# Patient Record
Sex: Male | Born: 1950 | Race: White | Hispanic: No | Smoking: Never smoker
Health system: Southern US, Community
[De-identification: ages and names within clinical notes are randomized; demographics above are authoritative.]

## PROBLEM LIST (undated history)

## (undated) DIAGNOSIS — I2510 Atherosclerotic heart disease of native coronary artery without angina pectoris: Principal | ICD-10-CM

## (undated) DIAGNOSIS — F419 Anxiety disorder, unspecified: Secondary | ICD-10-CM

## (undated) DIAGNOSIS — Z9889 Other specified postprocedural states: Secondary | ICD-10-CM

## (undated) HISTORY — DX: Atherosclerotic heart disease of native coronary artery without angina pectoris: I25.10

## (undated) HISTORY — PX: MITRAL VALVE REPAIR: SHX2039

## (undated) HISTORY — DX: Other specified postprocedural states: Z98.890

## (undated) HISTORY — PX: TONSILLECTOMY: SUR1361

---

## 2007-01-13 ENCOUNTER — Encounter: Admission: RE | Admit: 2007-01-13 | Discharge: 2007-01-13 | Payer: Self-pay | Admitting: Internal Medicine

## 2009-02-22 DIAGNOSIS — Z9889 Other specified postprocedural states: Secondary | ICD-10-CM

## 2009-02-22 HISTORY — DX: Other specified postprocedural states: Z98.890

## 2009-02-27 ENCOUNTER — Inpatient Hospital Stay (HOSPITAL_BASED_OUTPATIENT_CLINIC_OR_DEPARTMENT_OTHER): Admission: RE | Admit: 2009-02-27 | Discharge: 2009-02-27 | Payer: Self-pay | Admitting: Interventional Cardiology

## 2009-03-03 ENCOUNTER — Ambulatory Visit: Payer: Self-pay | Admitting: Thoracic Surgery (Cardiothoracic Vascular Surgery)

## 2009-03-06 ENCOUNTER — Encounter
Admission: RE | Admit: 2009-03-06 | Discharge: 2009-03-06 | Payer: Self-pay | Admitting: Thoracic Surgery (Cardiothoracic Vascular Surgery)

## 2009-03-07 ENCOUNTER — Encounter: Payer: Self-pay | Admitting: Thoracic Surgery (Cardiothoracic Vascular Surgery)

## 2009-03-07 ENCOUNTER — Ambulatory Visit (HOSPITAL_COMMUNITY)
Admission: RE | Admit: 2009-03-07 | Discharge: 2009-03-07 | Payer: Self-pay | Admitting: Thoracic Surgery (Cardiothoracic Vascular Surgery)

## 2009-03-11 ENCOUNTER — Encounter: Payer: Self-pay | Admitting: Thoracic Surgery (Cardiothoracic Vascular Surgery)

## 2009-03-11 ENCOUNTER — Inpatient Hospital Stay (HOSPITAL_COMMUNITY)
Admission: RE | Admit: 2009-03-11 | Discharge: 2009-03-15 | Payer: Self-pay | Admitting: Thoracic Surgery (Cardiothoracic Vascular Surgery)

## 2009-03-11 ENCOUNTER — Ambulatory Visit: Payer: Self-pay | Admitting: Thoracic Surgery (Cardiothoracic Vascular Surgery)

## 2009-03-24 ENCOUNTER — Ambulatory Visit: Payer: Self-pay | Admitting: Thoracic Surgery (Cardiothoracic Vascular Surgery)

## 2009-03-24 ENCOUNTER — Encounter
Admission: RE | Admit: 2009-03-24 | Discharge: 2009-03-24 | Payer: Self-pay | Admitting: Thoracic Surgery (Cardiothoracic Vascular Surgery)

## 2009-04-14 ENCOUNTER — Encounter
Admission: RE | Admit: 2009-04-14 | Discharge: 2009-04-14 | Payer: Self-pay | Admitting: Thoracic Surgery (Cardiothoracic Vascular Surgery)

## 2009-04-14 ENCOUNTER — Ambulatory Visit: Payer: Self-pay | Admitting: Thoracic Surgery (Cardiothoracic Vascular Surgery)

## 2009-04-17 ENCOUNTER — Encounter (HOSPITAL_COMMUNITY): Admission: RE | Admit: 2009-04-17 | Discharge: 2009-07-16 | Payer: Self-pay | Admitting: Interventional Cardiology

## 2009-07-23 ENCOUNTER — Encounter (HOSPITAL_COMMUNITY): Admission: RE | Admit: 2009-07-23 | Discharge: 2009-10-21 | Payer: Self-pay | Admitting: Interventional Cardiology

## 2009-10-13 ENCOUNTER — Ambulatory Visit: Payer: Self-pay | Admitting: Thoracic Surgery (Cardiothoracic Vascular Surgery)

## 2009-10-23 ENCOUNTER — Encounter (HOSPITAL_COMMUNITY)
Admission: RE | Admit: 2009-10-23 | Discharge: 2010-01-21 | Payer: Self-pay | Source: Home / Self Care | Admitting: Interventional Cardiology

## 2010-01-22 ENCOUNTER — Encounter (HOSPITAL_COMMUNITY)
Admission: RE | Admit: 2010-01-22 | Discharge: 2010-03-24 | Payer: Self-pay | Source: Home / Self Care | Attending: Interventional Cardiology | Admitting: Interventional Cardiology

## 2010-03-25 ENCOUNTER — Encounter (HOSPITAL_COMMUNITY): Admission: RE | Admit: 2010-03-25 | Payer: Self-pay | Source: Ambulatory Visit

## 2010-03-25 DIAGNOSIS — I059 Rheumatic mitral valve disease, unspecified: Secondary | ICD-10-CM | POA: Insufficient documentation

## 2010-03-25 DIAGNOSIS — D696 Thrombocytopenia, unspecified: Secondary | ICD-10-CM | POA: Insufficient documentation

## 2010-03-25 DIAGNOSIS — E785 Hyperlipidemia, unspecified: Secondary | ICD-10-CM | POA: Insufficient documentation

## 2010-03-25 DIAGNOSIS — Z5189 Encounter for other specified aftercare: Secondary | ICD-10-CM | POA: Insufficient documentation

## 2010-03-25 DIAGNOSIS — Z9889 Other specified postprocedural states: Secondary | ICD-10-CM | POA: Insufficient documentation

## 2010-03-27 ENCOUNTER — Encounter (HOSPITAL_COMMUNITY): Payer: Self-pay | Attending: Interventional Cardiology

## 2010-03-30 ENCOUNTER — Encounter (HOSPITAL_COMMUNITY): Payer: Self-pay

## 2010-04-01 ENCOUNTER — Encounter (HOSPITAL_COMMUNITY): Payer: Self-pay

## 2010-04-03 ENCOUNTER — Encounter (HOSPITAL_COMMUNITY): Payer: Self-pay

## 2010-04-06 ENCOUNTER — Encounter (HOSPITAL_COMMUNITY): Payer: Self-pay

## 2010-04-08 ENCOUNTER — Encounter (HOSPITAL_COMMUNITY): Payer: Self-pay

## 2010-04-10 ENCOUNTER — Encounter (HOSPITAL_COMMUNITY): Payer: Self-pay

## 2010-04-13 ENCOUNTER — Encounter (HOSPITAL_COMMUNITY): Payer: Self-pay

## 2010-04-15 ENCOUNTER — Encounter (HOSPITAL_COMMUNITY): Payer: Self-pay

## 2010-04-17 ENCOUNTER — Encounter (HOSPITAL_COMMUNITY): Payer: Self-pay

## 2010-04-20 ENCOUNTER — Encounter (HOSPITAL_COMMUNITY): Payer: Self-pay

## 2010-04-22 ENCOUNTER — Encounter (HOSPITAL_COMMUNITY): Payer: Self-pay

## 2010-04-24 ENCOUNTER — Encounter (HOSPITAL_COMMUNITY): Payer: Self-pay | Attending: Interventional Cardiology

## 2010-04-24 DIAGNOSIS — E785 Hyperlipidemia, unspecified: Secondary | ICD-10-CM | POA: Insufficient documentation

## 2010-04-24 DIAGNOSIS — D696 Thrombocytopenia, unspecified: Secondary | ICD-10-CM | POA: Insufficient documentation

## 2010-04-24 DIAGNOSIS — Z9889 Other specified postprocedural states: Secondary | ICD-10-CM | POA: Insufficient documentation

## 2010-04-24 DIAGNOSIS — Z5189 Encounter for other specified aftercare: Secondary | ICD-10-CM | POA: Insufficient documentation

## 2010-04-24 DIAGNOSIS — I059 Rheumatic mitral valve disease, unspecified: Secondary | ICD-10-CM | POA: Insufficient documentation

## 2010-04-27 ENCOUNTER — Encounter (HOSPITAL_COMMUNITY): Payer: Self-pay

## 2010-04-29 ENCOUNTER — Encounter (HOSPITAL_COMMUNITY): Payer: Self-pay

## 2010-05-01 ENCOUNTER — Encounter (HOSPITAL_COMMUNITY): Payer: Self-pay

## 2010-05-04 ENCOUNTER — Encounter (HOSPITAL_COMMUNITY): Payer: Self-pay

## 2010-05-06 ENCOUNTER — Encounter (HOSPITAL_COMMUNITY): Payer: Self-pay

## 2010-05-08 ENCOUNTER — Encounter (HOSPITAL_COMMUNITY): Payer: Self-pay

## 2010-05-10 LAB — TYPE AND SCREEN: Antibody Screen: NEGATIVE

## 2010-05-10 LAB — BLOOD GAS, ARTERIAL
Bicarbonate: 25.4 mEq/L — ABNORMAL HIGH (ref 20.0–24.0)
FIO2: 0.21 %
Patient temperature: 98.6
TCO2: 26.6 mmol/L (ref 0–100)
pCO2 arterial: 38.3 mmHg (ref 35.0–45.0)
pH, Arterial: 7.437 (ref 7.350–7.450)

## 2010-05-10 LAB — URINALYSIS, ROUTINE W REFLEX MICROSCOPIC
Bilirubin Urine: NEGATIVE
Hgb urine dipstick: NEGATIVE
Ketones, ur: NEGATIVE mg/dL
Nitrite: NEGATIVE
Urobilinogen, UA: 0.2 mg/dL (ref 0.0–1.0)
pH: 6 (ref 5.0–8.0)

## 2010-05-10 LAB — CBC
HCT: 48 % (ref 39.0–52.0)
Platelets: 210 10*3/uL (ref 150–400)
RBC: 4.74 MIL/uL (ref 4.22–5.81)

## 2010-05-10 LAB — POCT I-STAT 3, ART BLOOD GAS (G3+)
Acid-Base Excess: 2 mmol/L (ref 0.0–2.0)
Bicarbonate: 26 mEq/L — ABNORMAL HIGH (ref 20.0–24.0)
O2 Saturation: 96 %
pH, Arterial: 7.438 (ref 7.350–7.450)
pO2, Arterial: 78 mmHg — ABNORMAL LOW (ref 80.0–100.0)

## 2010-05-10 LAB — COMPREHENSIVE METABOLIC PANEL
ALT: 23 U/L (ref 0–53)
AST: 22 U/L (ref 0–37)
Alkaline Phosphatase: 43 U/L (ref 39–117)
BUN: 13 mg/dL (ref 6–23)
CO2: 29 mEq/L (ref 19–32)
Calcium: 10.4 mg/dL (ref 8.4–10.5)
Chloride: 98 mEq/L (ref 96–112)
GFR calc non Af Amer: 59 mL/min — ABNORMAL LOW (ref >60)
Potassium: 4.8 mEq/L (ref 3.5–5.1)
Sodium: 134 mEq/L — ABNORMAL LOW (ref 135–145)
Total Protein: 7.3 g/dL (ref 6.0–8.3)

## 2010-05-10 LAB — POCT I-STAT 3, VENOUS BLOOD GAS (G3P V)
Bicarbonate: 25.4 mEq/L — ABNORMAL HIGH (ref 20.0–24.0)
pCO2, Ven: 41.1 mmHg — ABNORMAL LOW (ref 45.0–50.0)
pH, Ven: 7.399 — ABNORMAL HIGH (ref 7.250–7.300)
pO2, Ven: 37 mmHg (ref 30.0–45.0)

## 2010-05-10 LAB — PROTIME-INR: Prothrombin Time: 13.7 seconds (ref 11.6–15.2)

## 2010-05-10 LAB — ABO/RH: ABO/RH(D): A POS

## 2010-05-11 ENCOUNTER — Encounter (HOSPITAL_COMMUNITY): Payer: Self-pay

## 2010-05-11 LAB — CBC
HCT: 17 % — ABNORMAL LOW (ref 39.0–52.0)
HCT: 27 % — ABNORMAL LOW (ref 39.0–52.0)
HCT: 27.2 % — ABNORMAL LOW (ref 39.0–52.0)
HCT: 39.2 % (ref 39.0–52.0)
Hemoglobin: 11.6 g/dL — ABNORMAL LOW (ref 13.0–17.0)
Hemoglobin: 13.4 g/dL (ref 13.0–17.0)
Hemoglobin: 5.8 g/dL — CL (ref 13.0–17.0)
Hemoglobin: 9.2 g/dL — ABNORMAL LOW (ref 13.0–17.0)
Hemoglobin: 9.3 g/dL — ABNORMAL LOW (ref 13.0–17.0)
MCHC: 33.7 g/dL (ref 30.0–36.0)
MCHC: 34.2 g/dL (ref 30.0–36.0)
MCHC: 34.3 g/dL (ref 30.0–36.0)
MCV: 97.4 fL (ref 78.0–100.0)
Platelets: 103 K/uL — ABNORMAL LOW (ref 150–400)
RBC: 2.79 MIL/uL — ABNORMAL LOW (ref 4.22–5.81)
RBC: 3.35 MIL/uL — ABNORMAL LOW (ref 4.22–5.81)
RBC: 3.85 MIL/uL — ABNORMAL LOW (ref 4.22–5.81)
RDW: 12.9 % (ref 11.5–15.5)
RDW: 13.2 % (ref 11.5–15.5)
RDW: 13.2 % (ref 11.5–15.5)
RDW: 15.5 % (ref 11.5–15.5)
RDW: 15.8 % — ABNORMAL HIGH (ref 11.5–15.5)
WBC: 13.3 K/uL — ABNORMAL HIGH (ref 4.0–10.5)
WBC: 15.7 10*3/uL — ABNORMAL HIGH (ref 4.0–10.5)

## 2010-05-11 LAB — POCT I-STAT 3, ART BLOOD GAS (G3+)
Acid-base deficit: 1 mmol/L (ref 0.0–2.0)
Bicarbonate: 20.9 mEq/L (ref 20.0–24.0)
O2 Saturation: 100 %
O2 Saturation: 89 %
Patient temperature: 34
Patient temperature: 37.4
TCO2: 22 mmol/L (ref 0–100)
TCO2: 25 mmol/L (ref 0–100)
pCO2 arterial: 39.8 mmHg (ref 35.0–45.0)
pCO2 arterial: 39.8 mmHg (ref 35.0–45.0)
pH, Arterial: 7.36 (ref 7.350–7.450)
pH, Arterial: 7.419 (ref 7.350–7.450)
pO2, Arterial: 299 mmHg — ABNORMAL HIGH (ref 80.0–100.0)

## 2010-05-11 LAB — DIFFERENTIAL
Basophils Absolute: 0 K/uL (ref 0.0–0.1)
Basophils Relative: 0 % (ref 0–1)
Eosinophils Absolute: 0 K/uL (ref 0.0–0.7)
Eosinophils Relative: 0 % (ref 0–5)
Lymphocytes Relative: 5 % — ABNORMAL LOW (ref 12–46)
Lymphs Abs: 0.6 K/uL — ABNORMAL LOW (ref 0.7–4.0)
Monocytes Absolute: 1 K/uL (ref 0.1–1.0)
Monocytes Relative: 8 % (ref 3–12)
Neutro Abs: 11.6 K/uL — ABNORMAL HIGH (ref 1.7–7.7)
Neutrophils Relative %: 87 % — ABNORMAL HIGH (ref 43–77)

## 2010-05-11 LAB — BASIC METABOLIC PANEL
BUN: 20 mg/dL (ref 6–23)
CO2: 24 mEq/L (ref 19–32)
Calcium: 7.4 mg/dL — ABNORMAL LOW (ref 8.4–10.5)
Chloride: 98 mEq/L (ref 96–112)
Creatinine, Ser: 1.16 mg/dL (ref 0.4–1.5)
GFR calc Af Amer: >60 mL/min (ref >60)
GFR calc non Af Amer: >60 mL/min (ref >60)
Glucose, Bld: 121 mg/dL — ABNORMAL HIGH (ref 70–99)
Glucose, Bld: 144 mg/dL — ABNORMAL HIGH (ref 70–99)
Potassium: 3.8 mEq/L (ref 3.5–5.1)

## 2010-05-11 LAB — POCT I-STAT, CHEM 8
BUN: 12 mg/dL (ref 6–23)
Chloride: 100 mEq/L (ref 96–112)
Chloride: 105 mEq/L (ref 96–112)
Glucose, Bld: 128 mg/dL — ABNORMAL HIGH (ref 70–99)
Glucose, Bld: 135 mg/dL — ABNORMAL HIGH (ref 70–99)
HCT: 18 % — ABNORMAL LOW (ref 39.0–52.0)
HCT: 35 % — ABNORMAL LOW (ref 39.0–52.0)
Potassium: 4 mEq/L (ref 3.5–5.1)
Potassium: 5.2 mEq/L — ABNORMAL HIGH (ref 3.5–5.1)

## 2010-05-11 LAB — BASIC METABOLIC PANEL WITH GFR
BUN: 15 mg/dL (ref 6–23)
CO2: 26 meq/L (ref 19–32)
Calcium: 7.2 mg/dL — ABNORMAL LOW (ref 8.4–10.5)
Chloride: 101 meq/L (ref 96–112)
Creatinine, Ser: 1.14 mg/dL (ref 0.4–1.5)
GFR calc Af Amer: >60 mL/min (ref >60)
GFR calc non Af Amer: >60 mL/min (ref >60)
Glucose, Bld: 114 mg/dL — ABNORMAL HIGH (ref 70–99)
Potassium: 4.1 meq/L (ref 3.5–5.1)
Sodium: 134 meq/L — ABNORMAL LOW (ref 135–145)

## 2010-05-11 LAB — POCT I-STAT 4, (NA,K, GLUC, HGB,HCT)
Glucose, Bld: 145 mg/dL — ABNORMAL HIGH (ref 70–99)
Glucose, Bld: 82 mg/dL (ref 70–99)
HCT: 31 % — ABNORMAL LOW (ref 39.0–52.0)
HCT: 42 % (ref 39.0–52.0)
Hemoglobin: 10.5 g/dL — ABNORMAL LOW (ref 13.0–17.0)
Hemoglobin: 14.3 g/dL (ref 13.0–17.0)
Potassium: 3.8 mEq/L (ref 3.5–5.1)
Potassium: 3.8 mEq/L (ref 3.5–5.1)
Potassium: 3.9 mEq/L (ref 3.5–5.1)
Potassium: 5.2 mEq/L — ABNORMAL HIGH (ref 3.5–5.1)
Sodium: 128 mEq/L — ABNORMAL LOW (ref 135–145)
Sodium: 134 mEq/L — ABNORMAL LOW (ref 135–145)
Sodium: 135 mEq/L (ref 135–145)

## 2010-05-11 LAB — PREPARE FRESH FROZEN PLASMA

## 2010-05-11 LAB — GLUCOSE, CAPILLARY
Glucose-Capillary: 107 mg/dL — ABNORMAL HIGH (ref 70–99)
Glucose-Capillary: 114 mg/dL — ABNORMAL HIGH (ref 70–99)
Glucose-Capillary: 122 mg/dL — ABNORMAL HIGH (ref 70–99)
Glucose-Capillary: 124 mg/dL — ABNORMAL HIGH (ref 70–99)
Glucose-Capillary: 126 mg/dL — ABNORMAL HIGH (ref 70–99)
Glucose-Capillary: 141 mg/dL — ABNORMAL HIGH (ref 70–99)
Glucose-Capillary: 84 mg/dL (ref 70–99)

## 2010-05-11 LAB — PROTIME-INR
INR: 1.57 — ABNORMAL HIGH (ref 0.00–1.49)
INR: 1.67 — ABNORMAL HIGH (ref 0.00–1.49)
Prothrombin Time: 14.4 seconds (ref 11.6–15.2)
Prothrombin Time: 18.6 seconds — ABNORMAL HIGH (ref 11.6–15.2)

## 2010-05-11 LAB — HEMOGLOBIN AND HEMATOCRIT, BLOOD
HCT: 33.5 % — ABNORMAL LOW (ref 39.0–52.0)
Hemoglobin: 11.1 g/dL — ABNORMAL LOW (ref 13.0–17.0)

## 2010-05-11 LAB — PREPARE PLATELETS

## 2010-05-11 LAB — APTT: aPTT: 41 seconds — ABNORMAL HIGH (ref 24–37)

## 2010-05-11 LAB — MAGNESIUM: Magnesium: 2.4 mg/dL (ref 1.5–2.5)

## 2010-05-11 LAB — CREATININE, SERUM
Creatinine, Ser: 1 mg/dL (ref 0.4–1.5)
GFR calc non Af Amer: >60 mL/min (ref >60)
GFR calc non Af Amer: >60 mL/min (ref >60)

## 2010-05-11 LAB — PLATELET COUNT: Platelets: 165 10*3/uL (ref 150–400)

## 2010-05-11 LAB — MRSA PCR SCREENING: MRSA by PCR: NEGATIVE

## 2010-05-13 ENCOUNTER — Encounter (HOSPITAL_COMMUNITY): Payer: Self-pay

## 2010-05-15 ENCOUNTER — Encounter (HOSPITAL_COMMUNITY): Payer: Self-pay

## 2010-05-18 ENCOUNTER — Encounter (HOSPITAL_COMMUNITY): Payer: Self-pay

## 2010-05-20 ENCOUNTER — Encounter (HOSPITAL_COMMUNITY): Payer: Self-pay

## 2010-05-22 ENCOUNTER — Encounter (HOSPITAL_COMMUNITY): Payer: Self-pay

## 2010-05-25 ENCOUNTER — Encounter (HOSPITAL_COMMUNITY): Payer: Self-pay | Attending: Interventional Cardiology

## 2010-05-25 DIAGNOSIS — Z9889 Other specified postprocedural states: Secondary | ICD-10-CM | POA: Insufficient documentation

## 2010-05-25 DIAGNOSIS — I059 Rheumatic mitral valve disease, unspecified: Secondary | ICD-10-CM | POA: Insufficient documentation

## 2010-05-25 DIAGNOSIS — D696 Thrombocytopenia, unspecified: Secondary | ICD-10-CM | POA: Insufficient documentation

## 2010-05-25 DIAGNOSIS — Z5189 Encounter for other specified aftercare: Secondary | ICD-10-CM | POA: Insufficient documentation

## 2010-05-25 DIAGNOSIS — E785 Hyperlipidemia, unspecified: Secondary | ICD-10-CM | POA: Insufficient documentation

## 2010-05-27 ENCOUNTER — Encounter (HOSPITAL_COMMUNITY): Payer: Self-pay

## 2010-05-29 ENCOUNTER — Encounter (HOSPITAL_COMMUNITY): Payer: Self-pay

## 2010-06-01 ENCOUNTER — Encounter (HOSPITAL_COMMUNITY): Payer: Self-pay

## 2010-06-03 ENCOUNTER — Encounter (HOSPITAL_COMMUNITY): Payer: Self-pay

## 2010-06-05 ENCOUNTER — Encounter (HOSPITAL_COMMUNITY): Payer: Self-pay

## 2010-06-08 ENCOUNTER — Encounter (HOSPITAL_COMMUNITY): Payer: Self-pay

## 2010-06-10 ENCOUNTER — Encounter (HOSPITAL_COMMUNITY): Payer: Self-pay

## 2010-06-12 ENCOUNTER — Encounter (HOSPITAL_COMMUNITY): Payer: Self-pay

## 2010-06-15 ENCOUNTER — Encounter (HOSPITAL_COMMUNITY): Payer: Self-pay

## 2010-06-17 ENCOUNTER — Encounter (HOSPITAL_COMMUNITY): Payer: Self-pay

## 2010-06-19 ENCOUNTER — Encounter (HOSPITAL_COMMUNITY): Payer: Self-pay

## 2010-06-22 ENCOUNTER — Encounter (HOSPITAL_COMMUNITY): Payer: Self-pay

## 2010-06-24 ENCOUNTER — Encounter (HOSPITAL_COMMUNITY): Payer: Self-pay | Attending: Interventional Cardiology

## 2010-06-24 DIAGNOSIS — I059 Rheumatic mitral valve disease, unspecified: Secondary | ICD-10-CM | POA: Insufficient documentation

## 2010-06-24 DIAGNOSIS — Z5189 Encounter for other specified aftercare: Secondary | ICD-10-CM | POA: Insufficient documentation

## 2010-06-24 DIAGNOSIS — D696 Thrombocytopenia, unspecified: Secondary | ICD-10-CM | POA: Insufficient documentation

## 2010-06-24 DIAGNOSIS — E785 Hyperlipidemia, unspecified: Secondary | ICD-10-CM | POA: Insufficient documentation

## 2010-06-24 DIAGNOSIS — Z9889 Other specified postprocedural states: Secondary | ICD-10-CM | POA: Insufficient documentation

## 2010-06-26 ENCOUNTER — Encounter (HOSPITAL_COMMUNITY): Payer: Self-pay

## 2010-06-29 ENCOUNTER — Encounter (HOSPITAL_COMMUNITY): Payer: Self-pay

## 2010-07-01 ENCOUNTER — Encounter (HOSPITAL_COMMUNITY): Payer: Self-pay

## 2010-07-03 ENCOUNTER — Encounter (HOSPITAL_COMMUNITY): Payer: Self-pay

## 2010-07-04 IMAGING — CR DG CHEST 2V
2 series · 2 of 2 positions shown · non-contrast
Comparison: Chest radiograph 03/15/2009

CLINICAL DATA: Weakness, mitral valve regurgitation

CHEST - 2 VIEW

[w chest pa]
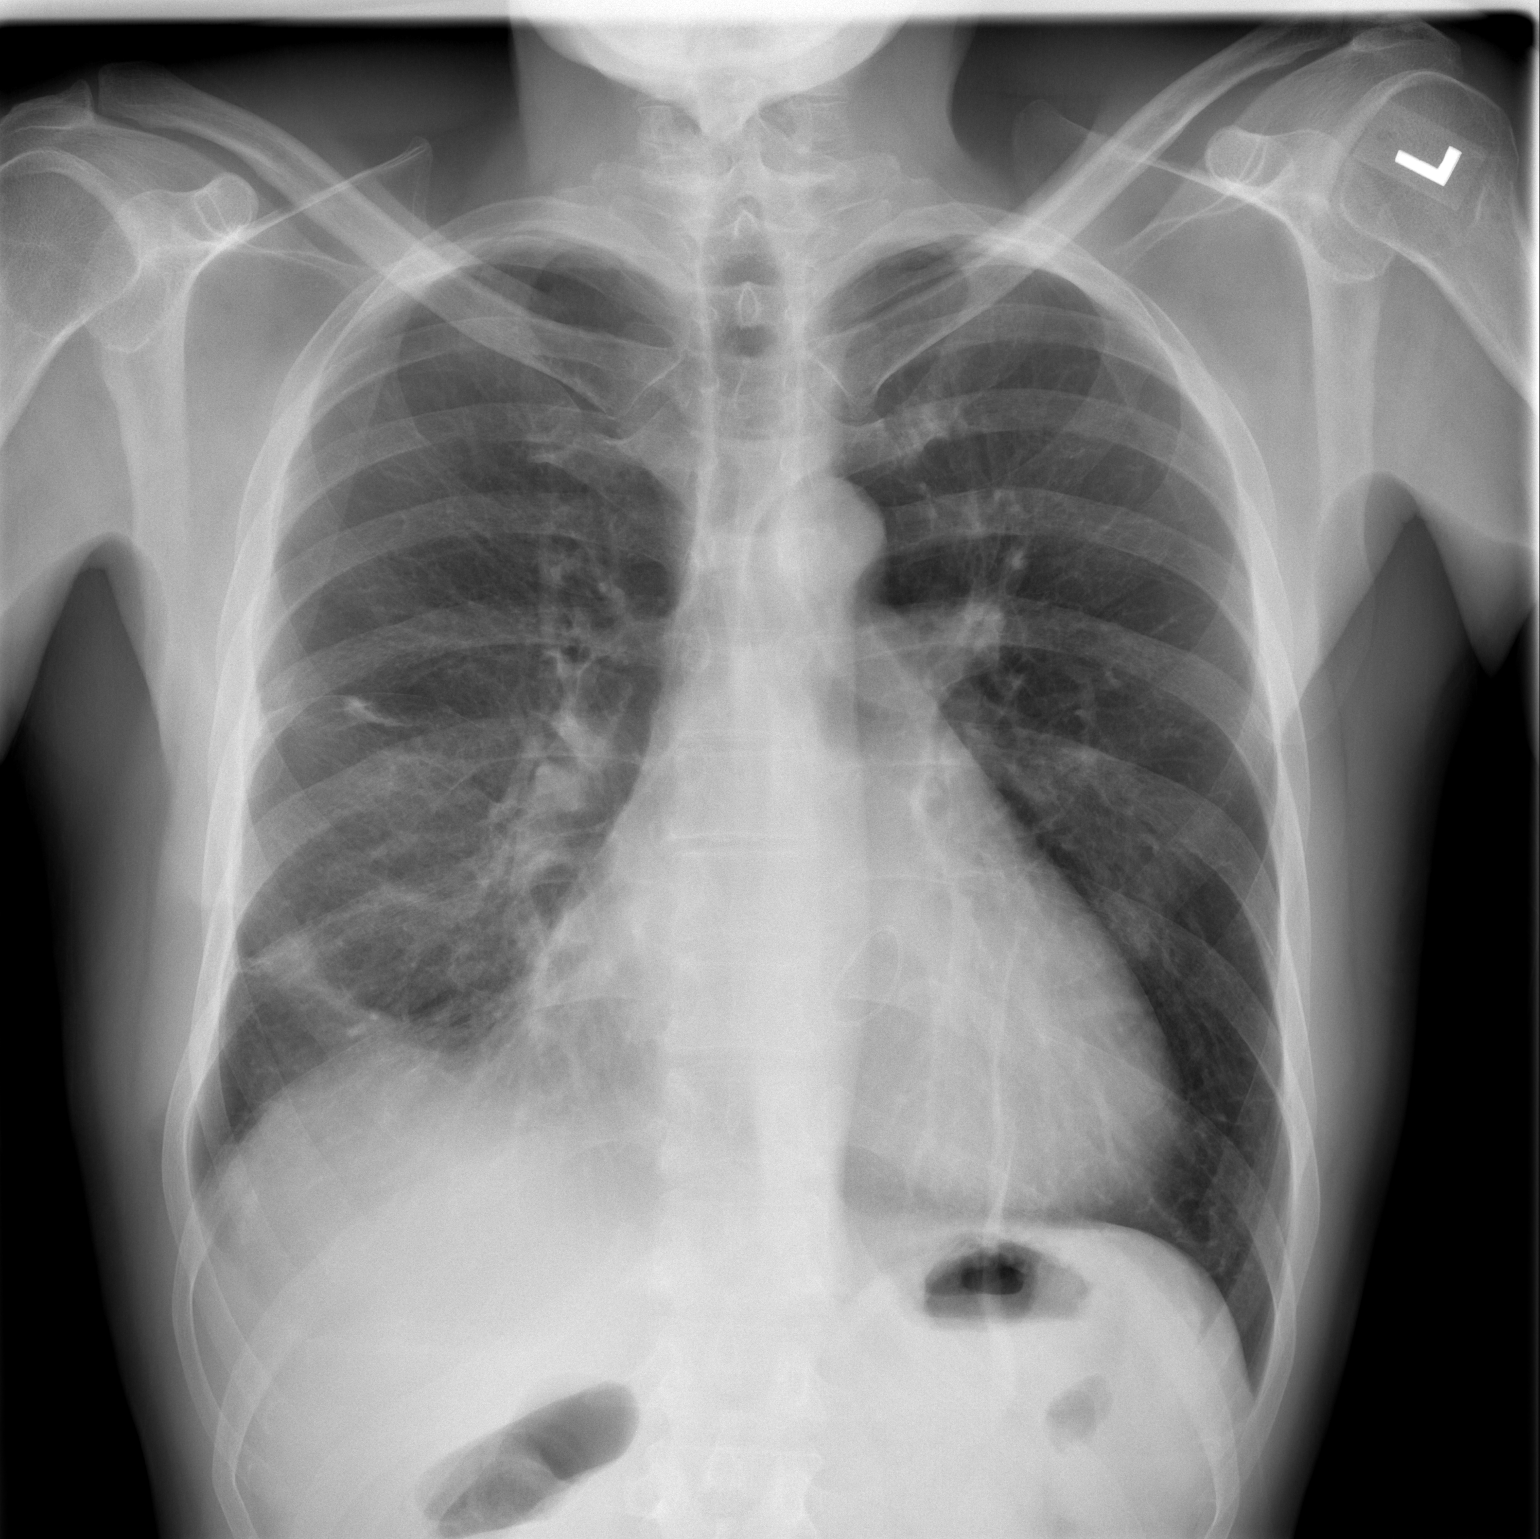

[w chest lat]
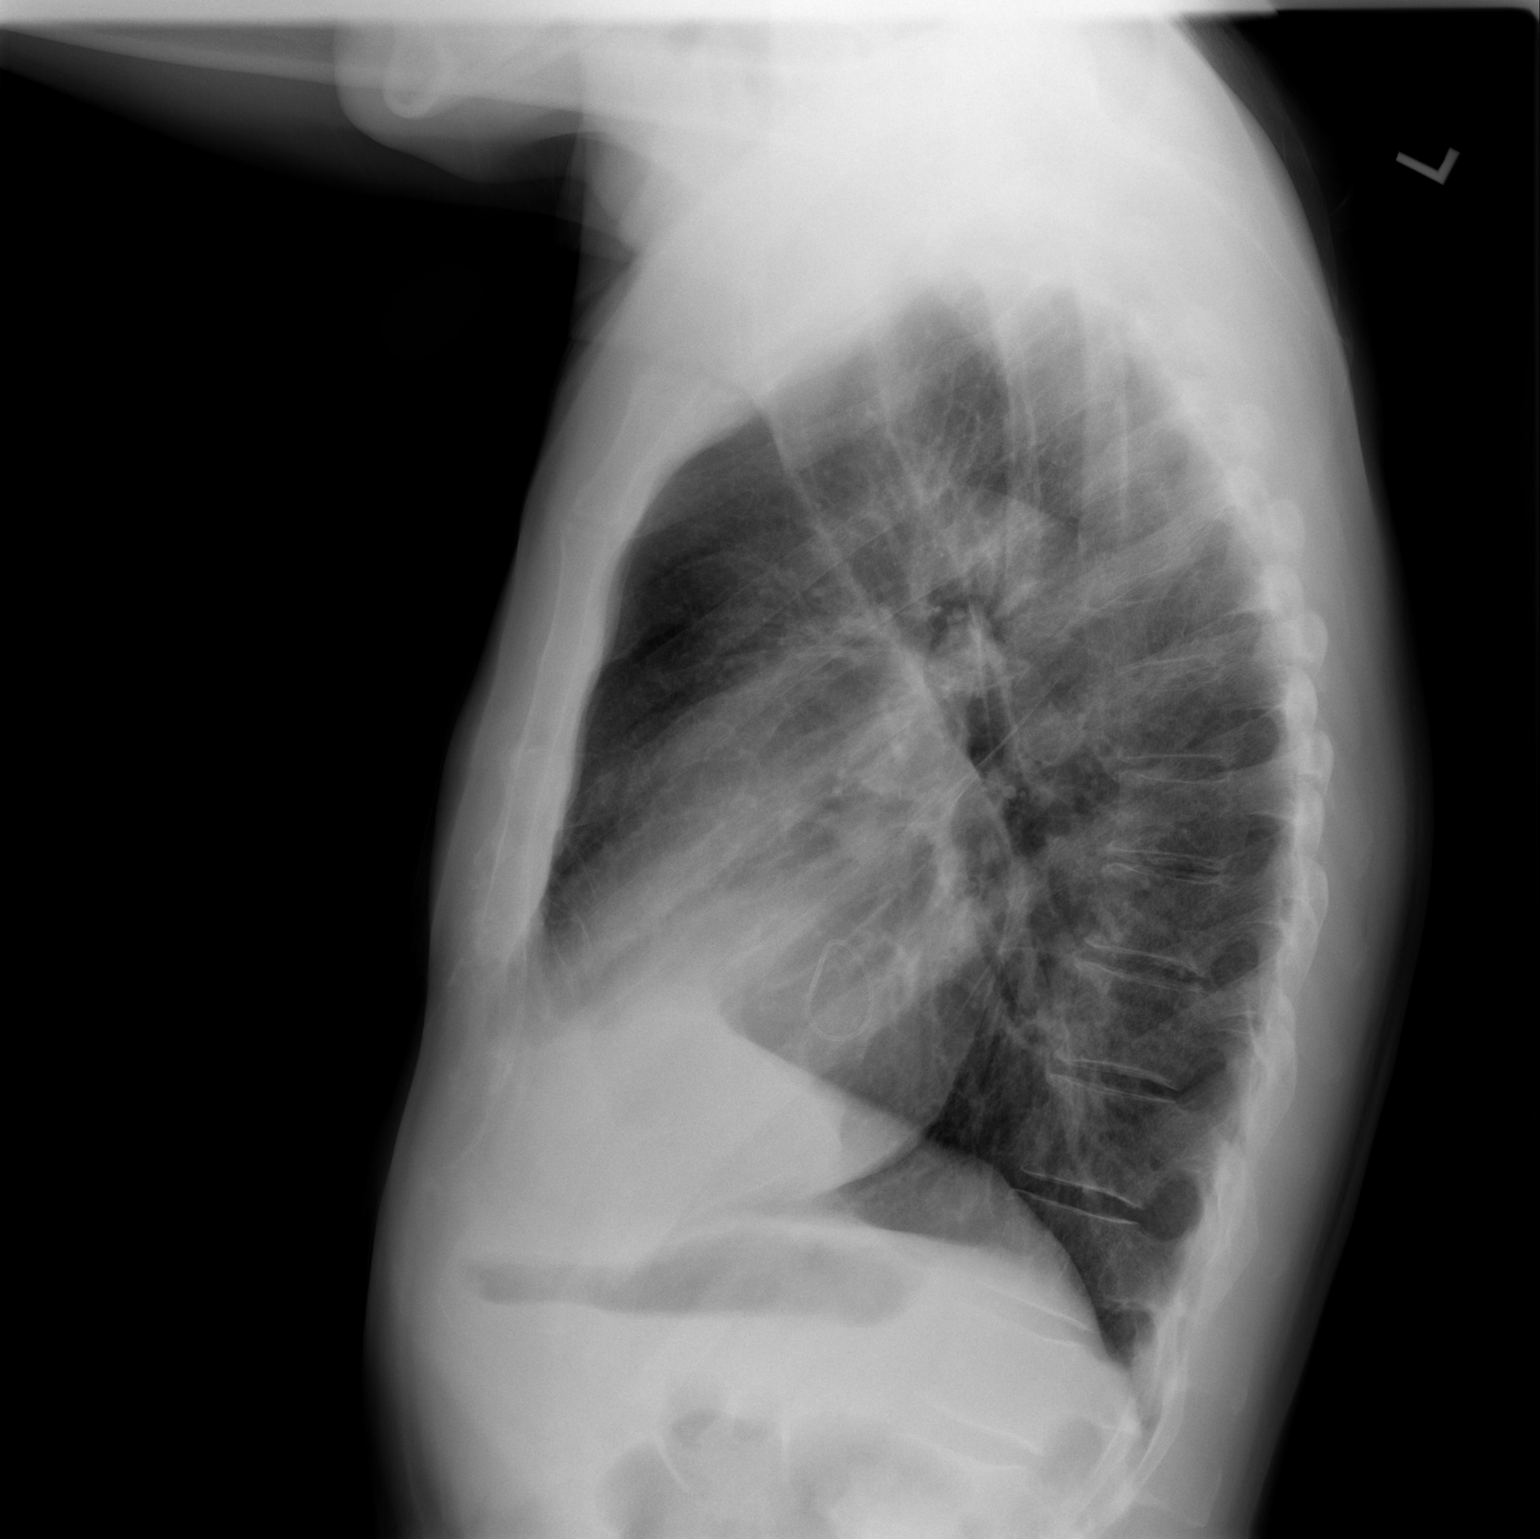

[2 of 2 positions shown; findings below may reference images not displayed]

FINDINGS: Stable enlarged cardiac silhouette.  Artificial valve
noted.  There is persistent atelectasis at the right lung base
which is slightly improved compared to prior.  The lungs are
hyperinflated.  No evidence of pneumothorax, consolidation, or
pleural fluid. Resolution of right chest wall subcutaneous gas.
IMPRESSION: 1.  Stable cardiomegaly.
2. Atelectasis  at the right lung base which is improved in the
interval.
3.  Interval resolution of right chest wall subcutaneous gas.

## 2010-07-06 ENCOUNTER — Encounter (HOSPITAL_COMMUNITY): Payer: Self-pay

## 2010-07-07 NOTE — Assessment & Plan Note (Signed)
OFFICE VISIT   Jerry Tanner, Jerry Tanner  DOB:  April 18, 1950                                        March 24, 2009  CHART #:  [id number]   HISTORY:  The patient is a 60 year old male from [place] who is seen  in the office in routine follow up having undergone a right miniature  thoracotomy for mitral valve repair (a complex valvuloplasty including  quadrangular resection of the posterior leaflets with sliding leaflet  reconstruction and 28-mm Memo 3-D ring annuloplasty).  This was done by  Dr. Cornelius Moras on March 11, 2009.  The patient did quite well  postoperatively and was discharged home on the March 15, 2009, in very  stable condition.  Initially postoperatively, the patient felt as though  he was doing quite well.  However, over the past approximate week he has  had some difficulties with weakness and dizziness.  He is taking only  occasional pain medication.  He denies fevers, chills, or other  constitutional symptoms.   DIAGNOSTIC TESTS:  A chest x-ray was obtained on today's date.  It  reveals some moderate right lower lobe atelectasis; however, no findings  consistent with congestive failure or pleural effusions.   PHYSICAL EXAMINATION:  Vital Signs:  Blood pressure 119/76, pulse 86,  respirations 18, oxygen saturation is 99% on room air.  General:  Appearance is a well-developed adult male in no acute distress.  Pulmonary:  Diminished slightly coarse breath sounds in the right base,  otherwise clear.  Cardiac:  Regular rate and rhythm.  Normal S1 and S2.  No murmurs.  No gallops.  No rubs.  Incisions are all inspected and  healing well without evidence of infection.  I discontinued 2 silk  sutures.  Extremities:  No edema.   ASSESSMENT:  The patient is making good overall recovery following his  minimally invasive mitral valve repair.  I feel as though at this time  we can discontinue his Lasix and potassium as it may be contributing to  his symptoms of  dizziness and fatigue.  He also feels somewhat dry and  frequently thirsty.  He is evidencing no findings consistent with  congestive failure clinically.  We will see him again in the office in 3  weeks with a repeat chest x-ray at that time.  I have asked  him to call the office and we can see him sooner if he has continued  ongoing difficulty with his feelings of weakness and dizziness after  stopping the Lasix and potassium.  He agrees to this.   Rowe Clack, P.A.-C.   Sherryll Burger  D:  03/24/2009  T:  03/25/2009  Job:  [id number]   cc:   Lyn Records, M.D.  Theressa Millard, M.D.

## 2010-07-07 NOTE — Assessment & Plan Note (Signed)
OFFICE VISIT   Jerry Tanner, Jerry Tanner  DOB:  March 17, 1950                                        April 14, 2009  CHART #:  [id number]   ADDENDUM   The patient underwent followup chest x-ray at [facility] today.  Chest x-ray demonstrates clear lung fields bilaterally with trivial  residual bibasilar atelectasis.  His lung fields overall look clear.  There is mild cardiomegaly which is stable.  No other abnormalities are  noted.    Salvatore Decent. Cornelius Moras, M.D.  Electronically Signed   CHO/MEDQ  D:  04/14/2009  T:  04/14/2009  Job:  [id number]

## 2010-07-07 NOTE — H&P (Signed)
HISTORY AND PHYSICAL EXAMINATION   March 03, 2009   Re:  GRAVES, NIPP       DOB:  11/16/1950   DATE OF [facility] ADMISSION AND SURGERY:  March 11, 2009   HISTORY OF PRESENT ILLNESS:  The patient is a 60 year old gentleman from  [place] with longstanding history of mitral valve prolapse and mitral  regurgitation.  The patient states that a heart murmur was picked up on  routine physical exam more than 15 years ago.  Since then, he has been  followed with periodic echocardiograms.  Several years ago, he was noted  to have significant mitral regurgitation, but he had continued to remain  asymptomatic.  This past summer, he developed an episode of what was  thought to be bronchitis.  At that time, he had a nonproductive cough as  well as symptoms of orthopnea and paroxysmal nocturnal dyspnea.  Most of  these symptoms improved.  However, since then he has continued to remain  fatigued and he admits that he has cut back his exercise and physical  activity significantly due to decreased exercise tolerance.  More  recently, he has been noted that his heart has been being stronger and  he has felt sensation of palpitations.  He returned to see Dr. Katrinka Blazing at  the [facility] Office on February 25, 2009.  At that time,  electrocardiogram was performed demonstrating what appeared to be sinus  tachycardia and B-Natriuretic peptide level was elevated at 304.  He  underwent a followup 2-D echocardiogram, which demonstrated the presence  of a flail posterior leaflet of the mitral valve with severe (4+) mitral  regurgitation.  There was significant sign of pulmonary hypertension  with some right ventricular failure and probably moderate tricuspid  regurgitation.  The patient was subsequently scheduled for elective left  and right heart catheterization.  This was performed by Dr. Katrinka Blazing on  February 27, 2009.  Pulmonary artery pressures were significantly elevated  and  measured 72/30 with pulmonary capillary wedge pressure of 25 and a  large V-wave of 46 mmHg.  Resting cardiac output was 4.2-4.5 L per  minute as measured by both thermodilution and the method of Fick.  Central venous pressure was 6.  Left ventriculogram demonstrated the  presence of dilated left ventricle with severe (4+) mitral  regurgitation.  Ejection fraction was estimated 45-50%.  There was  severe left atrial enlargement.  Coronary arteriography demonstrated the  presence of 50% stenosis of the left anterior descending coronary  artery.  There is otherwise no significant coronary artery disease  appreciated.  There is right-dominant coronary circulation.  The patient  has been referred to consider elective mitral valve repair.   REVIEW OF SYSTEMS:  GENERAL:  The patient reports normal appetite.  He  has not been gaining nor losing weight recently.  He is 5 feet 80 inches  tall and weighs approximately 163 pounds.  He does report exertional  fatigue.  CARDIAC:  Notable for the absence of any chest pain, chest pressure,  chest tightness either with activity or at rest.  He reports that he has  not recently had any problems with shortness of breath.  He denies PND,  orthopnea, lower extremity edema.  RESPIRATORY:  Negative.  The patient denies ongoing cough.  GASTROINTESTINAL:  Negative.  The patient has no difficulty swallowing.  He denies hematochezia, hematemesis, melena.  GENITOURINARY:  Negative.  The patient denies urinary urgency or frequency.  PERIPHERAL VASCULAR:  Negative.  NEUROLOGIC:  Negative.  HEENT:  Negative.  PSYCHIATRIC:  Notable for some problems with anxiety.  HEMATOLOGIC:  Negative.   PAST MEDICAL HISTORY:  1. Mitral valve prolapse with mitral regurgitation.  2. Hyperlipidemia.   PAST SURGICAL HISTORY:  Tonsillectomy.   FAMILY HISTORY:  The patient's mother had mitral valve repair last  summer.   SOCIAL HISTORY:  The patient is married and lives with  his wife here in  [place].  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  He is a  nonsmoker.  He denies significant alcohol consumption.   CURRENT MEDICATIONS:  1. Lisinopril/hydrochlorothiazide 10/12.5 one tablet daily.  2. Bupropion 100 mg daily.  3. Xanax 0.5 mg daily as needed.  4. Citalopram 40 mg daily.  5. Omega-3 fatty acid.   DRUG ALLERGIES:  None known.   PHYSICAL EXAMINATION:  General:  The patient is a well-appearing male  who appears of his stated age in no acute distress.  Blood pressure  140/82, pulse 112, oxygen saturation 98%.  HEENT:  Unrevealing.  There  are no carotid bruits.  There is no jugular venous distention.  There  are no palpable lymphadenopathy.  Auscultation of the chest demonstrates  clear breath sounds, which are symmetrical bilaterally.  No wheezes or  rhonchi are noted.  Cardiovascular:  Notable for somewhat elevated pulse  rate but regular rhythm.  There is a prominent grade 3-4/6 holosystolic  murmur heard all across the precordium with radiation to the back and  axilla.  No diastolic murmurs are noted.  Abdomen:  Soft, nondistended,  nontender.  Bowel sounds are present.  Extremities: Warm and well  perfused.  There is no lower extremity edema.  Femoral pulses are strong  and easily palpable.  Rectal and GU:  Both deferred.  Neurologic:  Grossly nonfocal and symmetrical throughout.   DIAGNOSTIC TESTS:  Transthoracic echocardiogram performed at [facility] Office last week is reviewed.  This demonstrates obvious  flail segment of the posterior leaflet of the mitral valve with severe  (4+) mitral regurgitation.  There is obviously flow reversal in the  pulmonary veins.  The left ventricle is mild to moderately dilated.  There is mild left ventricular dysfunction.  The aortic valve is normal.  There is significant right ventricular dilatation and volume overload  with septal flattening.  There is mild-to-moderate tricuspid   regurgitation.  No other abnormalities are noted.   Cardiac catheterization performed by Dr. Katrinka Blazing last week is reviewed.  Findings are as discussed previously.  Coronary anatomy is notable for  50% proximal stenosis of the left anterior descending coronary artery.  There is some calcification within this vessel.  However, the degree of  stenosis does not appear to be severe enough to warrant surgical  revascularization.  There are otherwise only luminal irregularities and  no other significant coronary artery disease appreciated.  There is  right-dominant coronary circulation.   IMPRESSION:  Mitral valve prolapse with severe (4+) mitral  regurgitation.  The patient has fairly high pulmonary artery pressures  with some right ventricular dilatation and failure and at least mild-to-  moderate tricuspid regurgitation as well.  He is already started to see  some left ventricular chamber enlargement and some tendency towards  decreased left ventricular function.  I agree that he should best be  treated with elective mitral valve repair as soon as practical.  I do  not feel that the degree of stenosis on the left anterior descending  coronary artery was severe  enough to merit surgical revascularization at  the time of surgery.  I am skeptical of the left internal mammary artery  graft would remained patent due to the presence of significant  competitive flow.  There is no other significant coronary artery disease  to be concerned about.   PLAN:  I have discussed options at length with the patient and his  family here in the office today.  Alternative treatment strategies have  been discussed.  They understand and accept all potential associated  risks of surgery including but not limited to risk of death, stroke,  myocardial infarction, congestive heart failure, respiratory failure,  renal failure, pneumonia, bleeding requiring blood transfusion,  arrhythmia, heart block and bradycardia  requiring permanent pacemaker.  They understand that we will make every attempt to repair his valve, but  there is a small chance that valve repair will not be feasible.  If that  were to occur, we would plan to replace his valve using a mechanical  prosthesis because of this relatively young age and otherwise fairly  good life expectancy.  He understands that we will plan to approach his  valve using minimally invasive techniques through right miniature  thoracotomy.  He understands that there is always a small chance we  would have to enlarge the thoracotomy or convert to a full sternotomy  during surgery.  All of these questions have been addressed.  We will  obtain CT angiogram of the thoracic and abdominal aorta to rule out  significant atherosclerotic disease in anticipation of surgery.  We will  go ahead and start him on prophylactic amiodarone to decrease his  likelihood of postoperative atrial dysrhythmias.  We plan to proceed  with surgery on Tuesday January 18.  All of the questions have been  addressed.   Salvatore Decent. Cornelius Moras, M.D.  Electronically Signed   CHO/MEDQ  D:  03/03/2009  T:  03/04/2009  Job:  [id number]   cc:   Lyn Records, M.D.  Theressa Millard, M.D.

## 2010-07-07 NOTE — Assessment & Plan Note (Signed)
OFFICE VISIT   MAXFIELD, GILDERSLEEVE  DOB:  [birth year]                                        April 14, 2009  CHART #:  [id number]   HISTORY:  The patient returns to the office today for further followup  status post right miniature thoracotomy for mitral valve repair on  March 11, 2009.  Postoperatively, he has done very well.  He has been  seen in follow up by Dr. Katrinka Blazing and his Coumadin dosing has been  monitored and adjusted through his office.  He reports that he thinks  his last INR measured 2.9.  He returns to the office for routine  followup today.  Overall, he feels quite well.  He has minimal residual  soreness right along his surgical incision from the right mini  thoracotomy.  He otherwise has no pain at all.  His exercise tolerance  is quite good and notably better than it was prior to surgery.  He is  walking more than a mile daily and he notes that he does not get short  of breath as easily as he used to.  His strength is slowly improving.  His appetite is normal.  He is sleeping well at night.  He has been  driving a car and overall having no problems.  He has not had any tachy  palpitations or dizzy spells.  The remainder of his review of systems is  entirely unrevealing.   CURRENT MEDICATIONS:  Amiodarone, Coumadin, aspirin, lisinopril,  simvastatin.   PHYSICAL EXAMINATION:  Notable for well-appearing male with blood  pressure 108/70, pulse 88, oxygen saturation 99% on room air.  Examination of the chest reveals a mini thoracotomy incision that is  healing very nicely.  Breath sounds are clear to auscultation and  symmetrical bilaterally.  No wheezes or rhonchi are noted.  Cardiovascular exam demonstrates regular rate and rhythm.  No murmurs,  rubs, or gallops are appreciated.  The abdomen is soft, nontender.  The  extremities are warm and well perfused.  Right groin incision is healing  fine.  There is no swelling or erythema.  Pulses are  intact.   IMPRESSION:  Excellent progress following right mini thoracotomy for  mitral valve repair.   PLAN:  I have instructed the patient to go ahead and stop taking  amiodarone.  This was originally ordered prophylactically to prevent  atrial arrhythmias, and he has not had any arrhythmias at all since his  surgery.  I think he can go ahead and stop it now.  I have suggested  that he probably should stay on Coumadin for another 4-6 weeks, after  which time he could simply go back to aspirin daily.  I have encouraged  at him to continue to gradually increase his physical activity without  any particular limitations at this point.  All of his questions have  been addressed.  At some point, he plans have a followup echocardiogram  through Dr. Michaelle Copas office within the next few months.  We will plan to  see him back in 6 months and review the results of his followup echo at  that time.  All of his questions have been addressed.   Salvatore Decent. Cornelius Moras, M.D.  Electronically Signed   CHO/MEDQ  D:  04/14/2009  T:  04/14/2009  Job:  [id number]   cc:  Lyn Records, M.D.  Theressa Millard, M.D.

## 2010-07-07 NOTE — Assessment & Plan Note (Signed)
OFFICE VISIT   Jerry Tanner, Jerry Tanner  DOB:  [birth year]                                        October 13, 2009  CHART #:  [id number]   HISTORY OF PRESENT ILLNESS:  The patient returns for routine followup  status post right miniature thoracotomy for mitral valve repair on  March 11, 2009.  Postoperatively, he has done very well.  He was last  seen here in the office on April 14, 2009.  Since then, he has had no  problems whatsoever.  He has stuck with the cardiac rehab program and he  continues to go 3 times a week.  He states that his exercise tolerance  is much better than it had been for several years leading up until his  surgery and he feels much better than he has in quite some time.  He has  absolutely no complaints.  He continues to follow up with Dr. Katrinka Blazing.  A  repeat echocardiogram was performed on September 09, 2009, demonstrating  normal left ventricular function with an intact mitral valve repair and  no residual mitral regurgitation.   CURRENT MEDICATIONS:  Aspirin, metoprolol, Wellbutrin, Xanax, and  simvastatin.   PHYSICAL EXAMINATION:  Notable for well-appearing male with blood  pressure 108/73, pulse 81, and oxygen saturation 99% on room air.  Examination of the chest reveals clear breath sounds that are  symmetrical bilaterally.  No wheezes, rales, or rhonchi are noted.  Cardiovascular exam includes regular rate and rhythm.  No murmurs, rubs,  gallops, are appreciated.  The abdomen is soft and nontender.  The  extremities are warm and well perfused.  There is no lower extremity  edema.   DIAGNOSTIC TESTS:  Report of 2-D echocardiogram performed September 09, 2009,  at [facility] is reviewed.  By report, this revealed mitral valve  repair with stable mitral valve annulus and no mitral regurgitation  whatsoever.  There is borderline left atrial enlargement.  Left  ventricular function was normal with ejection fraction estimated 50-55%.  No other  significant abnormalities were noted.   IMPRESSION:  The patient is doing very well, more than 6 months status  post mitral valve repair.   PLAN:  In the future, the patient will call and return to see [place] as  needed.  All of his questions have been addressed.   Salvatore Decent. Cornelius Moras, M.D.  Electronically Signed   CHO/MEDQ  D:  10/13/2009  T:  10/13/2009  Job:  [id number]   cc:   Lyn Records, M.D.  Theressa Millard, M.D.

## 2010-07-08 ENCOUNTER — Encounter (HOSPITAL_COMMUNITY): Payer: Self-pay

## 2010-07-10 ENCOUNTER — Encounter (HOSPITAL_COMMUNITY): Payer: Self-pay

## 2010-07-13 ENCOUNTER — Encounter (HOSPITAL_COMMUNITY): Payer: Self-pay

## 2010-07-15 ENCOUNTER — Encounter (HOSPITAL_COMMUNITY): Payer: Self-pay

## 2010-07-17 ENCOUNTER — Encounter (HOSPITAL_COMMUNITY): Payer: Self-pay

## 2010-07-20 ENCOUNTER — Encounter (HOSPITAL_COMMUNITY): Payer: Self-pay

## 2010-07-22 ENCOUNTER — Encounter (HOSPITAL_COMMUNITY): Payer: Self-pay

## 2010-07-24 ENCOUNTER — Encounter (HOSPITAL_COMMUNITY): Payer: Self-pay | Attending: Interventional Cardiology

## 2010-07-24 DIAGNOSIS — D696 Thrombocytopenia, unspecified: Secondary | ICD-10-CM | POA: Insufficient documentation

## 2010-07-24 DIAGNOSIS — Z5189 Encounter for other specified aftercare: Secondary | ICD-10-CM | POA: Insufficient documentation

## 2010-07-24 DIAGNOSIS — E785 Hyperlipidemia, unspecified: Secondary | ICD-10-CM | POA: Insufficient documentation

## 2010-07-24 DIAGNOSIS — Z9889 Other specified postprocedural states: Secondary | ICD-10-CM | POA: Insufficient documentation

## 2010-07-24 DIAGNOSIS — I059 Rheumatic mitral valve disease, unspecified: Secondary | ICD-10-CM | POA: Insufficient documentation

## 2010-07-27 ENCOUNTER — Encounter (HOSPITAL_COMMUNITY): Payer: Self-pay

## 2010-07-29 ENCOUNTER — Encounter (HOSPITAL_COMMUNITY): Payer: Self-pay

## 2010-07-31 ENCOUNTER — Encounter (HOSPITAL_COMMUNITY): Payer: Self-pay

## 2010-08-03 ENCOUNTER — Encounter (HOSPITAL_COMMUNITY): Payer: Self-pay

## 2010-08-05 ENCOUNTER — Encounter (HOSPITAL_COMMUNITY): Payer: Self-pay

## 2010-08-07 ENCOUNTER — Encounter (HOSPITAL_COMMUNITY): Payer: Self-pay

## 2010-08-10 ENCOUNTER — Encounter (HOSPITAL_COMMUNITY): Payer: Self-pay

## 2010-08-12 ENCOUNTER — Encounter (HOSPITAL_COMMUNITY): Payer: Self-pay

## 2010-08-14 ENCOUNTER — Encounter (HOSPITAL_COMMUNITY): Payer: Self-pay

## 2010-08-17 ENCOUNTER — Encounter (HOSPITAL_COMMUNITY): Payer: Self-pay

## 2010-08-19 ENCOUNTER — Encounter (HOSPITAL_COMMUNITY): Payer: Self-pay

## 2010-08-21 ENCOUNTER — Encounter (HOSPITAL_COMMUNITY): Payer: Self-pay

## 2010-08-24 ENCOUNTER — Encounter (HOSPITAL_COMMUNITY): Payer: Self-pay | Attending: Interventional Cardiology

## 2010-08-24 DIAGNOSIS — Z9889 Other specified postprocedural states: Secondary | ICD-10-CM | POA: Insufficient documentation

## 2010-08-24 DIAGNOSIS — E785 Hyperlipidemia, unspecified: Secondary | ICD-10-CM | POA: Insufficient documentation

## 2010-08-24 DIAGNOSIS — D696 Thrombocytopenia, unspecified: Secondary | ICD-10-CM | POA: Insufficient documentation

## 2010-08-24 DIAGNOSIS — Z5189 Encounter for other specified aftercare: Secondary | ICD-10-CM | POA: Insufficient documentation

## 2010-08-24 DIAGNOSIS — I059 Rheumatic mitral valve disease, unspecified: Secondary | ICD-10-CM | POA: Insufficient documentation

## 2010-08-26 ENCOUNTER — Encounter (HOSPITAL_COMMUNITY): Payer: Self-pay

## 2010-08-28 ENCOUNTER — Encounter (HOSPITAL_COMMUNITY): Payer: Self-pay

## 2010-08-31 ENCOUNTER — Encounter (HOSPITAL_COMMUNITY): Payer: Self-pay

## 2010-09-02 ENCOUNTER — Encounter (HOSPITAL_COMMUNITY): Payer: Self-pay

## 2010-09-04 ENCOUNTER — Encounter (HOSPITAL_COMMUNITY): Payer: Self-pay

## 2010-09-07 ENCOUNTER — Encounter (HOSPITAL_COMMUNITY): Payer: Self-pay

## 2010-09-09 ENCOUNTER — Encounter (HOSPITAL_COMMUNITY): Payer: Self-pay

## 2010-09-11 ENCOUNTER — Encounter (HOSPITAL_COMMUNITY): Payer: Self-pay

## 2010-09-14 ENCOUNTER — Encounter (HOSPITAL_COMMUNITY): Payer: Self-pay

## 2010-09-16 ENCOUNTER — Encounter (HOSPITAL_COMMUNITY): Payer: Self-pay

## 2010-09-18 ENCOUNTER — Encounter (HOSPITAL_COMMUNITY): Payer: Self-pay

## 2010-09-21 ENCOUNTER — Encounter (HOSPITAL_COMMUNITY): Payer: Self-pay

## 2010-09-23 ENCOUNTER — Encounter (HOSPITAL_COMMUNITY): Payer: Self-pay | Attending: Interventional Cardiology

## 2010-09-23 DIAGNOSIS — E785 Hyperlipidemia, unspecified: Secondary | ICD-10-CM | POA: Insufficient documentation

## 2010-09-23 DIAGNOSIS — Z5189 Encounter for other specified aftercare: Secondary | ICD-10-CM | POA: Insufficient documentation

## 2010-09-23 DIAGNOSIS — D696 Thrombocytopenia, unspecified: Secondary | ICD-10-CM | POA: Insufficient documentation

## 2010-09-23 DIAGNOSIS — Z9889 Other specified postprocedural states: Secondary | ICD-10-CM | POA: Insufficient documentation

## 2010-09-23 DIAGNOSIS — I059 Rheumatic mitral valve disease, unspecified: Secondary | ICD-10-CM | POA: Insufficient documentation

## 2010-09-25 ENCOUNTER — Encounter (HOSPITAL_COMMUNITY): Payer: Self-pay

## 2010-09-28 ENCOUNTER — Encounter (HOSPITAL_COMMUNITY): Payer: Self-pay

## 2010-09-30 ENCOUNTER — Encounter (HOSPITAL_COMMUNITY): Payer: Self-pay

## 2010-10-02 ENCOUNTER — Encounter (HOSPITAL_COMMUNITY): Payer: Self-pay

## 2010-10-05 ENCOUNTER — Encounter (HOSPITAL_COMMUNITY): Payer: Self-pay

## 2010-10-07 ENCOUNTER — Encounter (HOSPITAL_COMMUNITY): Payer: Self-pay

## 2010-10-09 ENCOUNTER — Encounter (HOSPITAL_COMMUNITY): Payer: Self-pay

## 2010-10-12 ENCOUNTER — Encounter (HOSPITAL_COMMUNITY): Payer: Self-pay

## 2010-10-14 ENCOUNTER — Encounter (HOSPITAL_COMMUNITY): Payer: Self-pay

## 2010-10-16 ENCOUNTER — Encounter (HOSPITAL_COMMUNITY): Payer: Self-pay

## 2010-10-19 ENCOUNTER — Encounter (HOSPITAL_COMMUNITY): Payer: Self-pay

## 2010-10-21 ENCOUNTER — Encounter (HOSPITAL_COMMUNITY): Payer: Self-pay

## 2010-10-23 ENCOUNTER — Encounter (HOSPITAL_COMMUNITY): Payer: Self-pay

## 2010-10-26 ENCOUNTER — Encounter (HOSPITAL_COMMUNITY): Payer: Self-pay | Attending: Interventional Cardiology

## 2010-10-26 DIAGNOSIS — D696 Thrombocytopenia, unspecified: Secondary | ICD-10-CM | POA: Insufficient documentation

## 2010-10-26 DIAGNOSIS — I059 Rheumatic mitral valve disease, unspecified: Secondary | ICD-10-CM | POA: Insufficient documentation

## 2010-10-26 DIAGNOSIS — E785 Hyperlipidemia, unspecified: Secondary | ICD-10-CM | POA: Insufficient documentation

## 2010-10-26 DIAGNOSIS — Z5189 Encounter for other specified aftercare: Secondary | ICD-10-CM | POA: Insufficient documentation

## 2010-10-26 DIAGNOSIS — Z9889 Other specified postprocedural states: Secondary | ICD-10-CM | POA: Insufficient documentation

## 2010-10-28 ENCOUNTER — Encounter (HOSPITAL_COMMUNITY): Payer: Self-pay

## 2010-10-30 ENCOUNTER — Encounter (HOSPITAL_COMMUNITY): Payer: Self-pay

## 2010-11-02 ENCOUNTER — Encounter (HOSPITAL_COMMUNITY): Payer: Self-pay

## 2010-11-04 ENCOUNTER — Encounter (HOSPITAL_COMMUNITY): Payer: Self-pay

## 2010-11-06 ENCOUNTER — Encounter (HOSPITAL_COMMUNITY): Payer: Self-pay

## 2010-11-09 ENCOUNTER — Encounter (HOSPITAL_COMMUNITY): Payer: Self-pay

## 2010-11-11 ENCOUNTER — Encounter (HOSPITAL_COMMUNITY): Payer: Self-pay

## 2010-11-13 ENCOUNTER — Encounter (HOSPITAL_COMMUNITY): Payer: Self-pay

## 2010-11-16 ENCOUNTER — Encounter (HOSPITAL_COMMUNITY): Payer: Self-pay

## 2010-11-18 ENCOUNTER — Encounter (HOSPITAL_COMMUNITY): Payer: Self-pay

## 2010-11-20 ENCOUNTER — Encounter (HOSPITAL_COMMUNITY): Payer: Self-pay

## 2010-11-23 ENCOUNTER — Encounter (HOSPITAL_COMMUNITY): Payer: Self-pay | Attending: Interventional Cardiology

## 2010-11-23 DIAGNOSIS — E785 Hyperlipidemia, unspecified: Secondary | ICD-10-CM | POA: Insufficient documentation

## 2010-11-23 DIAGNOSIS — D696 Thrombocytopenia, unspecified: Secondary | ICD-10-CM | POA: Insufficient documentation

## 2010-11-23 DIAGNOSIS — Z5189 Encounter for other specified aftercare: Secondary | ICD-10-CM | POA: Insufficient documentation

## 2010-11-23 DIAGNOSIS — I059 Rheumatic mitral valve disease, unspecified: Secondary | ICD-10-CM | POA: Insufficient documentation

## 2010-11-23 DIAGNOSIS — Z9889 Other specified postprocedural states: Secondary | ICD-10-CM | POA: Insufficient documentation

## 2010-11-25 ENCOUNTER — Encounter (HOSPITAL_COMMUNITY): Payer: Self-pay

## 2010-11-27 ENCOUNTER — Encounter (HOSPITAL_COMMUNITY): Payer: Self-pay

## 2010-11-30 ENCOUNTER — Encounter (HOSPITAL_COMMUNITY): Payer: Self-pay

## 2010-12-02 ENCOUNTER — Encounter (HOSPITAL_COMMUNITY): Payer: Self-pay

## 2010-12-04 ENCOUNTER — Encounter (HOSPITAL_COMMUNITY): Payer: Self-pay

## 2010-12-07 ENCOUNTER — Encounter (HOSPITAL_COMMUNITY): Payer: Self-pay

## 2010-12-09 ENCOUNTER — Encounter (HOSPITAL_COMMUNITY): Payer: Self-pay

## 2010-12-09 ENCOUNTER — Ambulatory Visit (HOSPITAL_COMMUNITY): Payer: Self-pay

## 2010-12-11 ENCOUNTER — Encounter (HOSPITAL_COMMUNITY): Payer: Self-pay

## 2010-12-14 ENCOUNTER — Encounter (HOSPITAL_COMMUNITY): Payer: Self-pay

## 2010-12-16 ENCOUNTER — Encounter (HOSPITAL_COMMUNITY): Payer: Self-pay

## 2010-12-18 ENCOUNTER — Encounter (HOSPITAL_COMMUNITY): Payer: Self-pay

## 2010-12-21 ENCOUNTER — Encounter (HOSPITAL_COMMUNITY): Payer: Self-pay

## 2010-12-23 ENCOUNTER — Encounter (HOSPITAL_COMMUNITY): Payer: Self-pay

## 2010-12-25 ENCOUNTER — Encounter (HOSPITAL_COMMUNITY): Payer: Self-pay

## 2010-12-25 DIAGNOSIS — E785 Hyperlipidemia, unspecified: Secondary | ICD-10-CM | POA: Insufficient documentation

## 2010-12-25 DIAGNOSIS — Z5189 Encounter for other specified aftercare: Secondary | ICD-10-CM | POA: Insufficient documentation

## 2010-12-25 DIAGNOSIS — Z9889 Other specified postprocedural states: Secondary | ICD-10-CM | POA: Insufficient documentation

## 2010-12-25 DIAGNOSIS — D696 Thrombocytopenia, unspecified: Secondary | ICD-10-CM | POA: Insufficient documentation

## 2010-12-25 DIAGNOSIS — I059 Rheumatic mitral valve disease, unspecified: Secondary | ICD-10-CM | POA: Insufficient documentation

## 2010-12-28 ENCOUNTER — Encounter (HOSPITAL_COMMUNITY): Payer: Self-pay

## 2010-12-30 ENCOUNTER — Encounter (HOSPITAL_COMMUNITY): Payer: Self-pay

## 2011-01-01 ENCOUNTER — Encounter (HOSPITAL_COMMUNITY): Payer: Self-pay

## 2011-01-04 ENCOUNTER — Encounter (HOSPITAL_COMMUNITY): Payer: Self-pay

## 2011-01-06 ENCOUNTER — Encounter (HOSPITAL_COMMUNITY): Payer: Self-pay

## 2011-01-08 ENCOUNTER — Encounter (HOSPITAL_COMMUNITY)
Admission: RE | Admit: 2011-01-08 | Discharge: 2011-01-08 | Disposition: A | Discharge status: Home / Self Care | Payer: Self-pay | Source: Ambulatory Visit | Attending: Interventional Cardiology | Admitting: Interventional Cardiology

## 2011-01-11 ENCOUNTER — Encounter (HOSPITAL_COMMUNITY)
Admission: RE | Admit: 2011-01-11 | Discharge: 2011-01-11 | Disposition: A | Discharge status: Home / Self Care | Payer: Self-pay | Source: Ambulatory Visit | Attending: Interventional Cardiology | Admitting: Interventional Cardiology

## 2011-01-13 ENCOUNTER — Encounter (HOSPITAL_COMMUNITY)
Admission: RE | Admit: 2011-01-13 | Discharge: 2011-01-13 | Disposition: A | Discharge status: Home / Self Care | Payer: Self-pay | Source: Ambulatory Visit | Attending: Interventional Cardiology | Admitting: Interventional Cardiology

## 2011-01-15 ENCOUNTER — Encounter (HOSPITAL_COMMUNITY): Payer: Self-pay

## 2011-01-18 ENCOUNTER — Encounter (HOSPITAL_COMMUNITY)
Admission: RE | Admit: 2011-01-18 | Discharge: 2011-01-18 | Disposition: A | Discharge status: Home / Self Care | Payer: Self-pay | Source: Ambulatory Visit | Attending: Interventional Cardiology | Admitting: Interventional Cardiology

## 2011-01-20 ENCOUNTER — Encounter (HOSPITAL_COMMUNITY)
Admission: RE | Admit: 2011-01-20 | Discharge: 2011-01-20 | Disposition: A | Discharge status: Home / Self Care | Payer: Self-pay | Source: Ambulatory Visit | Attending: Interventional Cardiology | Admitting: Interventional Cardiology

## 2011-01-22 ENCOUNTER — Encounter (HOSPITAL_COMMUNITY)
Admission: RE | Admit: 2011-01-22 | Discharge: 2011-01-22 | Disposition: A | Discharge status: Home / Self Care | Payer: Self-pay | Source: Ambulatory Visit | Attending: Interventional Cardiology | Admitting: Interventional Cardiology

## 2011-01-25 ENCOUNTER — Encounter (HOSPITAL_COMMUNITY)
Admission: RE | Admit: 2011-01-25 | Discharge: 2011-01-25 | Disposition: A | Discharge status: Home / Self Care | Payer: Self-pay | Source: Ambulatory Visit | Attending: Interventional Cardiology | Admitting: Interventional Cardiology

## 2011-01-25 DIAGNOSIS — Z9889 Other specified postprocedural states: Secondary | ICD-10-CM | POA: Insufficient documentation

## 2011-01-25 DIAGNOSIS — I059 Rheumatic mitral valve disease, unspecified: Secondary | ICD-10-CM | POA: Insufficient documentation

## 2011-01-25 DIAGNOSIS — Z5189 Encounter for other specified aftercare: Secondary | ICD-10-CM | POA: Insufficient documentation

## 2011-01-25 DIAGNOSIS — E785 Hyperlipidemia, unspecified: Secondary | ICD-10-CM | POA: Insufficient documentation

## 2011-01-25 DIAGNOSIS — D696 Thrombocytopenia, unspecified: Secondary | ICD-10-CM | POA: Insufficient documentation

## 2011-01-27 ENCOUNTER — Encounter (HOSPITAL_COMMUNITY)
Admission: RE | Admit: 2011-01-27 | Discharge: 2011-01-27 | Disposition: A | Discharge status: Home / Self Care | Payer: Self-pay | Source: Ambulatory Visit | Attending: Interventional Cardiology | Admitting: Interventional Cardiology

## 2011-01-29 ENCOUNTER — Encounter (HOSPITAL_COMMUNITY)
Admission: RE | Admit: 2011-01-29 | Discharge: 2011-01-29 | Disposition: A | Discharge status: Home / Self Care | Payer: Self-pay | Source: Ambulatory Visit | Attending: Interventional Cardiology | Admitting: Interventional Cardiology

## 2011-02-01 ENCOUNTER — Encounter (HOSPITAL_COMMUNITY)
Admission: RE | Admit: 2011-02-01 | Discharge: 2011-02-01 | Disposition: A | Discharge status: Home / Self Care | Payer: Self-pay | Source: Ambulatory Visit | Attending: Interventional Cardiology | Admitting: Interventional Cardiology

## 2011-02-03 ENCOUNTER — Encounter (HOSPITAL_COMMUNITY)
Admission: RE | Admit: 2011-02-03 | Discharge: 2011-02-03 | Disposition: A | Discharge status: Home / Self Care | Payer: Self-pay | Source: Ambulatory Visit | Attending: Interventional Cardiology | Admitting: Interventional Cardiology

## 2011-02-05 ENCOUNTER — Encounter (HOSPITAL_COMMUNITY)
Admission: RE | Admit: 2011-02-05 | Discharge: 2011-02-05 | Disposition: A | Discharge status: Home / Self Care | Payer: Self-pay | Source: Ambulatory Visit | Attending: Interventional Cardiology | Admitting: Interventional Cardiology

## 2011-02-08 ENCOUNTER — Encounter (HOSPITAL_COMMUNITY)
Admission: RE | Admit: 2011-02-08 | Discharge: 2011-02-08 | Disposition: A | Discharge status: Home / Self Care | Payer: Self-pay | Source: Ambulatory Visit | Attending: Interventional Cardiology | Admitting: Interventional Cardiology

## 2011-02-10 ENCOUNTER — Encounter (HOSPITAL_COMMUNITY)
Admission: RE | Admit: 2011-02-10 | Discharge: 2011-02-10 | Disposition: A | Discharge status: Home / Self Care | Payer: Self-pay | Source: Ambulatory Visit | Attending: Interventional Cardiology | Admitting: Interventional Cardiology

## 2011-02-12 ENCOUNTER — Encounter (HOSPITAL_COMMUNITY)
Admission: RE | Admit: 2011-02-12 | Discharge: 2011-02-12 | Disposition: A | Discharge status: Home / Self Care | Payer: Self-pay | Source: Ambulatory Visit | Attending: Interventional Cardiology | Admitting: Interventional Cardiology

## 2011-02-15 ENCOUNTER — Encounter (HOSPITAL_COMMUNITY): Payer: Self-pay

## 2011-02-17 ENCOUNTER — Encounter (HOSPITAL_COMMUNITY): Payer: Self-pay

## 2011-02-19 ENCOUNTER — Encounter (HOSPITAL_COMMUNITY): Payer: Self-pay

## 2011-02-22 ENCOUNTER — Encounter (HOSPITAL_COMMUNITY): Payer: Self-pay

## 2011-02-24 ENCOUNTER — Encounter (HOSPITAL_COMMUNITY): Payer: Self-pay

## 2011-02-26 ENCOUNTER — Encounter (HOSPITAL_COMMUNITY)
Admission: RE | Admit: 2011-02-26 | Discharge: 2011-02-26 | Disposition: A | Discharge status: Home / Self Care | Payer: Self-pay | Source: Ambulatory Visit | Attending: Interventional Cardiology | Admitting: Interventional Cardiology

## 2011-02-26 DIAGNOSIS — Z9889 Other specified postprocedural states: Secondary | ICD-10-CM | POA: Insufficient documentation

## 2011-02-26 DIAGNOSIS — E785 Hyperlipidemia, unspecified: Secondary | ICD-10-CM | POA: Insufficient documentation

## 2011-02-26 DIAGNOSIS — D696 Thrombocytopenia, unspecified: Secondary | ICD-10-CM | POA: Insufficient documentation

## 2011-02-26 DIAGNOSIS — I059 Rheumatic mitral valve disease, unspecified: Secondary | ICD-10-CM | POA: Insufficient documentation

## 2011-02-26 DIAGNOSIS — Z5189 Encounter for other specified aftercare: Secondary | ICD-10-CM | POA: Insufficient documentation

## 2011-03-01 ENCOUNTER — Encounter (HOSPITAL_COMMUNITY)
Admission: RE | Admit: 2011-03-01 | Discharge: 2011-03-01 | Disposition: A | Discharge status: Home / Self Care | Payer: Self-pay | Source: Ambulatory Visit | Attending: Interventional Cardiology | Admitting: Interventional Cardiology

## 2011-03-03 ENCOUNTER — Encounter (HOSPITAL_COMMUNITY): Payer: Self-pay

## 2011-03-05 ENCOUNTER — Encounter (HOSPITAL_COMMUNITY)
Admission: RE | Admit: 2011-03-05 | Discharge: 2011-03-05 | Disposition: A | Discharge status: Home / Self Care | Payer: Self-pay | Source: Ambulatory Visit | Attending: Interventional Cardiology | Admitting: Interventional Cardiology

## 2011-03-08 ENCOUNTER — Encounter (HOSPITAL_COMMUNITY)
Admission: RE | Admit: 2011-03-08 | Discharge: 2011-03-08 | Disposition: A | Discharge status: Home / Self Care | Payer: Self-pay | Source: Ambulatory Visit | Attending: Interventional Cardiology | Admitting: Interventional Cardiology

## 2011-03-10 ENCOUNTER — Encounter (HOSPITAL_COMMUNITY)
Admission: RE | Admit: 2011-03-10 | Discharge: 2011-03-10 | Disposition: A | Discharge status: Home / Self Care | Payer: Self-pay | Source: Ambulatory Visit | Attending: Interventional Cardiology | Admitting: Interventional Cardiology

## 2011-03-12 ENCOUNTER — Encounter (HOSPITAL_COMMUNITY)
Admission: RE | Admit: 2011-03-12 | Discharge: 2011-03-12 | Disposition: A | Discharge status: Home / Self Care | Payer: Self-pay | Source: Ambulatory Visit | Attending: Interventional Cardiology | Admitting: Interventional Cardiology

## 2011-03-15 ENCOUNTER — Encounter (HOSPITAL_COMMUNITY)
Admission: RE | Admit: 2011-03-15 | Discharge: 2011-03-15 | Disposition: A | Discharge status: Home / Self Care | Payer: Self-pay | Source: Ambulatory Visit | Attending: Interventional Cardiology | Admitting: Interventional Cardiology

## 2011-03-17 ENCOUNTER — Encounter (HOSPITAL_COMMUNITY)
Admission: RE | Admit: 2011-03-17 | Discharge: 2011-03-17 | Disposition: A | Discharge status: Home / Self Care | Payer: Self-pay | Source: Ambulatory Visit | Attending: Interventional Cardiology | Admitting: Interventional Cardiology

## 2011-03-19 ENCOUNTER — Encounter (HOSPITAL_COMMUNITY)
Admission: RE | Admit: 2011-03-19 | Discharge: 2011-03-19 | Disposition: A | Discharge status: Home / Self Care | Payer: Self-pay | Source: Ambulatory Visit | Attending: Interventional Cardiology | Admitting: Interventional Cardiology

## 2011-03-22 ENCOUNTER — Encounter (HOSPITAL_COMMUNITY)
Admission: RE | Admit: 2011-03-22 | Discharge: 2011-03-22 | Disposition: A | Discharge status: Home / Self Care | Payer: Self-pay | Source: Ambulatory Visit | Attending: Interventional Cardiology | Admitting: Interventional Cardiology

## 2011-03-24 ENCOUNTER — Encounter (HOSPITAL_COMMUNITY)
Admission: RE | Admit: 2011-03-24 | Discharge: 2011-03-24 | Disposition: A | Discharge status: Home / Self Care | Payer: Self-pay | Source: Ambulatory Visit | Attending: Interventional Cardiology | Admitting: Interventional Cardiology

## 2011-03-26 ENCOUNTER — Encounter (HOSPITAL_COMMUNITY)
Admission: RE | Admit: 2011-03-26 | Discharge: 2011-03-26 | Disposition: A | Discharge status: Home / Self Care | Payer: Self-pay | Source: Ambulatory Visit | Attending: Interventional Cardiology | Admitting: Interventional Cardiology

## 2011-03-26 DIAGNOSIS — Z9889 Other specified postprocedural states: Secondary | ICD-10-CM | POA: Insufficient documentation

## 2011-03-26 DIAGNOSIS — Z5189 Encounter for other specified aftercare: Secondary | ICD-10-CM | POA: Insufficient documentation

## 2011-03-26 DIAGNOSIS — E785 Hyperlipidemia, unspecified: Secondary | ICD-10-CM | POA: Insufficient documentation

## 2011-03-26 DIAGNOSIS — I059 Rheumatic mitral valve disease, unspecified: Secondary | ICD-10-CM | POA: Insufficient documentation

## 2011-03-26 DIAGNOSIS — D696 Thrombocytopenia, unspecified: Secondary | ICD-10-CM | POA: Insufficient documentation

## 2011-03-29 ENCOUNTER — Encounter (HOSPITAL_COMMUNITY)
Admission: RE | Admit: 2011-03-29 | Discharge: 2011-03-29 | Disposition: A | Discharge status: Home / Self Care | Payer: Self-pay | Source: Ambulatory Visit | Attending: Interventional Cardiology | Admitting: Interventional Cardiology

## 2011-03-31 ENCOUNTER — Encounter (HOSPITAL_COMMUNITY)
Admission: RE | Admit: 2011-03-31 | Discharge: 2011-03-31 | Disposition: A | Discharge status: Home / Self Care | Payer: Self-pay | Source: Ambulatory Visit | Attending: Interventional Cardiology | Admitting: Interventional Cardiology

## 2011-04-02 ENCOUNTER — Encounter (HOSPITAL_COMMUNITY)
Admission: RE | Admit: 2011-04-02 | Discharge: 2011-04-02 | Disposition: A | Discharge status: Home / Self Care | Payer: Self-pay | Source: Ambulatory Visit | Attending: Interventional Cardiology | Admitting: Interventional Cardiology

## 2011-04-05 ENCOUNTER — Encounter (HOSPITAL_COMMUNITY)
Admission: RE | Admit: 2011-04-05 | Discharge: 2011-04-05 | Disposition: A | Discharge status: Home / Self Care | Payer: Self-pay | Source: Ambulatory Visit | Attending: Interventional Cardiology | Admitting: Interventional Cardiology

## 2011-04-07 ENCOUNTER — Encounter (HOSPITAL_COMMUNITY)
Admission: RE | Admit: 2011-04-07 | Discharge: 2011-04-07 | Disposition: A | Discharge status: Home / Self Care | Payer: Self-pay | Source: Ambulatory Visit | Attending: Interventional Cardiology | Admitting: Interventional Cardiology

## 2011-04-09 ENCOUNTER — Encounter (HOSPITAL_COMMUNITY)
Admission: RE | Admit: 2011-04-09 | Discharge: 2011-04-09 | Disposition: A | Discharge status: Home / Self Care | Payer: Self-pay | Source: Ambulatory Visit | Attending: Interventional Cardiology | Admitting: Interventional Cardiology

## 2011-04-12 ENCOUNTER — Encounter (HOSPITAL_COMMUNITY)
Admission: RE | Admit: 2011-04-12 | Discharge: 2011-04-12 | Disposition: A | Discharge status: Home / Self Care | Payer: Self-pay | Source: Ambulatory Visit | Attending: Interventional Cardiology | Admitting: Interventional Cardiology

## 2011-04-14 ENCOUNTER — Encounter (HOSPITAL_COMMUNITY)
Admission: RE | Admit: 2011-04-14 | Discharge: 2011-04-14 | Disposition: A | Discharge status: Home / Self Care | Payer: Self-pay | Source: Ambulatory Visit | Attending: Interventional Cardiology | Admitting: Interventional Cardiology

## 2011-04-16 ENCOUNTER — Encounter (HOSPITAL_COMMUNITY)
Admission: RE | Admit: 2011-04-16 | Discharge: 2011-04-16 | Disposition: A | Discharge status: Home / Self Care | Payer: Self-pay | Source: Ambulatory Visit | Attending: Interventional Cardiology | Admitting: Interventional Cardiology

## 2011-04-19 ENCOUNTER — Encounter (HOSPITAL_COMMUNITY)
Admission: RE | Admit: 2011-04-19 | Discharge: 2011-04-19 | Disposition: A | Discharge status: Home / Self Care | Payer: Self-pay | Source: Ambulatory Visit | Attending: Interventional Cardiology | Admitting: Interventional Cardiology

## 2011-04-21 ENCOUNTER — Encounter (HOSPITAL_COMMUNITY)
Admission: RE | Admit: 2011-04-21 | Discharge: 2011-04-21 | Disposition: A | Discharge status: Home / Self Care | Payer: Self-pay | Source: Ambulatory Visit | Attending: Interventional Cardiology | Admitting: Interventional Cardiology

## 2011-04-23 ENCOUNTER — Encounter (HOSPITAL_COMMUNITY)
Admission: RE | Admit: 2011-04-23 | Discharge: 2011-04-23 | Disposition: A | Discharge status: Home / Self Care | Payer: Self-pay | Source: Ambulatory Visit | Attending: Interventional Cardiology | Admitting: Interventional Cardiology

## 2011-04-23 DIAGNOSIS — Z9889 Other specified postprocedural states: Secondary | ICD-10-CM | POA: Insufficient documentation

## 2011-04-23 DIAGNOSIS — I059 Rheumatic mitral valve disease, unspecified: Secondary | ICD-10-CM | POA: Insufficient documentation

## 2011-04-23 DIAGNOSIS — D696 Thrombocytopenia, unspecified: Secondary | ICD-10-CM | POA: Insufficient documentation

## 2011-04-23 DIAGNOSIS — Z5189 Encounter for other specified aftercare: Secondary | ICD-10-CM | POA: Insufficient documentation

## 2011-04-23 DIAGNOSIS — E785 Hyperlipidemia, unspecified: Secondary | ICD-10-CM | POA: Insufficient documentation

## 2011-04-26 ENCOUNTER — Encounter (HOSPITAL_COMMUNITY)
Admission: RE | Admit: 2011-04-26 | Discharge: 2011-04-26 | Disposition: A | Discharge status: Home / Self Care | Payer: Self-pay | Source: Ambulatory Visit | Attending: Interventional Cardiology | Admitting: Interventional Cardiology

## 2011-04-28 ENCOUNTER — Encounter (HOSPITAL_COMMUNITY)
Admission: RE | Admit: 2011-04-28 | Discharge: 2011-04-28 | Disposition: A | Discharge status: Home / Self Care | Payer: Self-pay | Source: Ambulatory Visit | Attending: Interventional Cardiology | Admitting: Interventional Cardiology

## 2011-04-30 ENCOUNTER — Encounter (HOSPITAL_COMMUNITY)
Admission: RE | Admit: 2011-04-30 | Discharge: 2011-04-30 | Disposition: A | Discharge status: Home / Self Care | Payer: Self-pay | Source: Ambulatory Visit | Attending: Interventional Cardiology | Admitting: Interventional Cardiology

## 2011-05-03 ENCOUNTER — Encounter (HOSPITAL_COMMUNITY)
Admission: RE | Admit: 2011-05-03 | Discharge: 2011-05-03 | Disposition: A | Discharge status: Home / Self Care | Payer: Self-pay | Source: Ambulatory Visit | Attending: Interventional Cardiology | Admitting: Interventional Cardiology

## 2011-05-05 ENCOUNTER — Encounter (HOSPITAL_COMMUNITY): Payer: Self-pay

## 2011-05-07 ENCOUNTER — Encounter (HOSPITAL_COMMUNITY)
Admission: RE | Admit: 2011-05-07 | Discharge: 2011-05-07 | Disposition: A | Discharge status: Home / Self Care | Payer: Self-pay | Source: Ambulatory Visit | Attending: Interventional Cardiology | Admitting: Interventional Cardiology

## 2011-05-10 ENCOUNTER — Encounter (HOSPITAL_COMMUNITY): Payer: Self-pay

## 2011-05-12 ENCOUNTER — Encounter (HOSPITAL_COMMUNITY)
Admission: RE | Admit: 2011-05-12 | Discharge: 2011-05-12 | Disposition: A | Discharge status: Home / Self Care | Payer: Self-pay | Source: Ambulatory Visit | Attending: Interventional Cardiology | Admitting: Interventional Cardiology

## 2011-05-14 ENCOUNTER — Encounter (HOSPITAL_COMMUNITY)
Admission: RE | Admit: 2011-05-14 | Discharge: 2011-05-14 | Disposition: A | Discharge status: Home / Self Care | Payer: Self-pay | Source: Ambulatory Visit | Attending: Interventional Cardiology | Admitting: Interventional Cardiology

## 2011-05-17 ENCOUNTER — Encounter (HOSPITAL_COMMUNITY)
Admission: RE | Admit: 2011-05-17 | Discharge: 2011-05-17 | Disposition: A | Discharge status: Home / Self Care | Payer: Self-pay | Source: Ambulatory Visit | Attending: Interventional Cardiology | Admitting: Interventional Cardiology

## 2011-05-19 ENCOUNTER — Encounter (HOSPITAL_COMMUNITY)
Admission: RE | Admit: 2011-05-19 | Discharge: 2011-05-19 | Disposition: A | Discharge status: Home / Self Care | Payer: Self-pay | Source: Ambulatory Visit | Attending: Interventional Cardiology | Admitting: Interventional Cardiology

## 2011-05-21 ENCOUNTER — Encounter (HOSPITAL_COMMUNITY)
Admission: RE | Admit: 2011-05-21 | Discharge: 2011-05-21 | Disposition: A | Discharge status: Home / Self Care | Payer: Self-pay | Source: Ambulatory Visit | Attending: Interventional Cardiology | Admitting: Interventional Cardiology

## 2011-05-24 ENCOUNTER — Encounter (HOSPITAL_COMMUNITY)
Admission: RE | Admit: 2011-05-24 | Discharge: 2011-05-24 | Disposition: A | Discharge status: Home / Self Care | Payer: Self-pay | Source: Ambulatory Visit | Attending: Interventional Cardiology | Admitting: Interventional Cardiology

## 2011-05-24 DIAGNOSIS — Z5189 Encounter for other specified aftercare: Secondary | ICD-10-CM | POA: Insufficient documentation

## 2011-05-24 DIAGNOSIS — D696 Thrombocytopenia, unspecified: Secondary | ICD-10-CM | POA: Insufficient documentation

## 2011-05-24 DIAGNOSIS — E785 Hyperlipidemia, unspecified: Secondary | ICD-10-CM | POA: Insufficient documentation

## 2011-05-24 DIAGNOSIS — I059 Rheumatic mitral valve disease, unspecified: Secondary | ICD-10-CM | POA: Insufficient documentation

## 2011-05-24 DIAGNOSIS — Z9889 Other specified postprocedural states: Secondary | ICD-10-CM | POA: Insufficient documentation

## 2011-05-26 ENCOUNTER — Encounter (HOSPITAL_COMMUNITY)
Admission: RE | Admit: 2011-05-26 | Discharge: 2011-05-26 | Disposition: A | Discharge status: Home / Self Care | Payer: Self-pay | Source: Ambulatory Visit | Attending: Interventional Cardiology | Admitting: Interventional Cardiology

## 2011-05-28 ENCOUNTER — Encounter (HOSPITAL_COMMUNITY)
Admission: RE | Admit: 2011-05-28 | Discharge: 2011-05-28 | Disposition: A | Discharge status: Home / Self Care | Payer: Self-pay | Source: Ambulatory Visit | Attending: Interventional Cardiology | Admitting: Interventional Cardiology

## 2011-05-31 ENCOUNTER — Encounter (HOSPITAL_COMMUNITY)
Admission: RE | Admit: 2011-05-31 | Discharge: 2011-05-31 | Disposition: A | Discharge status: Home / Self Care | Payer: Self-pay | Source: Ambulatory Visit | Attending: Interventional Cardiology | Admitting: Interventional Cardiology

## 2011-06-02 ENCOUNTER — Encounter (HOSPITAL_COMMUNITY)
Admission: RE | Admit: 2011-06-02 | Discharge: 2011-06-02 | Disposition: A | Discharge status: Home / Self Care | Payer: Self-pay | Source: Ambulatory Visit | Attending: Interventional Cardiology | Admitting: Interventional Cardiology

## 2011-06-04 ENCOUNTER — Encounter (HOSPITAL_COMMUNITY)
Admission: RE | Admit: 2011-06-04 | Discharge: 2011-06-04 | Disposition: A | Discharge status: Home / Self Care | Payer: Self-pay | Source: Ambulatory Visit | Attending: Interventional Cardiology | Admitting: Interventional Cardiology

## 2011-06-07 ENCOUNTER — Encounter (HOSPITAL_COMMUNITY)
Admission: RE | Admit: 2011-06-07 | Discharge: 2011-06-07 | Disposition: A | Discharge status: Home / Self Care | Payer: Self-pay | Source: Ambulatory Visit | Attending: Interventional Cardiology | Admitting: Interventional Cardiology

## 2011-06-09 ENCOUNTER — Encounter (HOSPITAL_COMMUNITY)
Admission: RE | Admit: 2011-06-09 | Discharge: 2011-06-09 | Disposition: A | Discharge status: Home / Self Care | Payer: Self-pay | Source: Ambulatory Visit | Attending: Interventional Cardiology | Admitting: Interventional Cardiology

## 2011-06-11 ENCOUNTER — Encounter (HOSPITAL_COMMUNITY)
Admission: RE | Admit: 2011-06-11 | Discharge: 2011-06-11 | Disposition: A | Discharge status: Home / Self Care | Payer: Self-pay | Source: Ambulatory Visit | Attending: Interventional Cardiology | Admitting: Interventional Cardiology

## 2011-06-14 ENCOUNTER — Encounter (HOSPITAL_COMMUNITY)
Admission: RE | Admit: 2011-06-14 | Discharge: 2011-06-14 | Disposition: A | Discharge status: Home / Self Care | Payer: Self-pay | Source: Ambulatory Visit | Attending: Interventional Cardiology | Admitting: Interventional Cardiology

## 2011-06-16 ENCOUNTER — Encounter (HOSPITAL_COMMUNITY)
Admission: RE | Admit: 2011-06-16 | Discharge: 2011-06-16 | Disposition: A | Discharge status: Home / Self Care | Payer: Self-pay | Source: Ambulatory Visit | Attending: Interventional Cardiology | Admitting: Interventional Cardiology

## 2011-06-18 ENCOUNTER — Encounter (HOSPITAL_COMMUNITY)
Admission: RE | Admit: 2011-06-18 | Discharge: 2011-06-18 | Disposition: A | Discharge status: Home / Self Care | Payer: Self-pay | Source: Ambulatory Visit | Attending: Interventional Cardiology | Admitting: Interventional Cardiology

## 2011-06-21 ENCOUNTER — Encounter (HOSPITAL_COMMUNITY)
Admission: RE | Admit: 2011-06-21 | Discharge: 2011-06-21 | Disposition: A | Discharge status: Home / Self Care | Payer: Self-pay | Source: Ambulatory Visit | Attending: Interventional Cardiology | Admitting: Interventional Cardiology

## 2011-06-23 ENCOUNTER — Encounter (HOSPITAL_COMMUNITY)
Admission: RE | Admit: 2011-06-23 | Discharge: 2011-06-23 | Disposition: A | Discharge status: Home / Self Care | Payer: Self-pay | Source: Ambulatory Visit | Attending: Interventional Cardiology | Admitting: Interventional Cardiology

## 2011-06-23 DIAGNOSIS — Z9889 Other specified postprocedural states: Secondary | ICD-10-CM | POA: Insufficient documentation

## 2011-06-23 DIAGNOSIS — D696 Thrombocytopenia, unspecified: Secondary | ICD-10-CM | POA: Insufficient documentation

## 2011-06-23 DIAGNOSIS — E785 Hyperlipidemia, unspecified: Secondary | ICD-10-CM | POA: Insufficient documentation

## 2011-06-23 DIAGNOSIS — I059 Rheumatic mitral valve disease, unspecified: Secondary | ICD-10-CM | POA: Insufficient documentation

## 2011-06-23 DIAGNOSIS — Z5189 Encounter for other specified aftercare: Secondary | ICD-10-CM | POA: Insufficient documentation

## 2011-06-25 ENCOUNTER — Encounter (HOSPITAL_COMMUNITY)
Admission: RE | Admit: 2011-06-25 | Discharge: 2011-06-25 | Disposition: A | Discharge status: Home / Self Care | Payer: Self-pay | Source: Ambulatory Visit | Attending: Interventional Cardiology | Admitting: Interventional Cardiology

## 2011-06-28 ENCOUNTER — Encounter (HOSPITAL_COMMUNITY)
Admission: RE | Admit: 2011-06-28 | Discharge: 2011-06-28 | Disposition: A | Discharge status: Home / Self Care | Payer: Self-pay | Source: Ambulatory Visit | Attending: Interventional Cardiology | Admitting: Interventional Cardiology

## 2011-06-30 ENCOUNTER — Encounter (HOSPITAL_COMMUNITY)
Admission: RE | Admit: 2011-06-30 | Discharge: 2011-06-30 | Disposition: A | Discharge status: Home / Self Care | Payer: Self-pay | Source: Ambulatory Visit | Attending: Interventional Cardiology | Admitting: Interventional Cardiology

## 2011-07-02 ENCOUNTER — Encounter (HOSPITAL_COMMUNITY)
Admission: RE | Admit: 2011-07-02 | Discharge: 2011-07-02 | Disposition: A | Discharge status: Home / Self Care | Payer: Self-pay | Source: Ambulatory Visit | Attending: Interventional Cardiology | Admitting: Interventional Cardiology

## 2011-07-05 ENCOUNTER — Encounter (HOSPITAL_COMMUNITY)
Admission: RE | Admit: 2011-07-05 | Discharge: 2011-07-05 | Disposition: A | Discharge status: Home / Self Care | Payer: Self-pay | Source: Ambulatory Visit | Attending: Interventional Cardiology | Admitting: Interventional Cardiology

## 2011-07-07 ENCOUNTER — Encounter (HOSPITAL_COMMUNITY)
Admission: RE | Admit: 2011-07-07 | Discharge: 2011-07-07 | Disposition: A | Discharge status: Home / Self Care | Payer: Self-pay | Source: Ambulatory Visit | Attending: Interventional Cardiology | Admitting: Interventional Cardiology

## 2011-07-09 ENCOUNTER — Encounter (HOSPITAL_COMMUNITY)
Admission: RE | Admit: 2011-07-09 | Discharge: 2011-07-09 | Disposition: A | Discharge status: Home / Self Care | Payer: Self-pay | Source: Ambulatory Visit | Attending: Interventional Cardiology | Admitting: Interventional Cardiology

## 2011-07-12 ENCOUNTER — Encounter (HOSPITAL_COMMUNITY)
Admission: RE | Admit: 2011-07-12 | Discharge: 2011-07-12 | Disposition: A | Discharge status: Home / Self Care | Payer: Self-pay | Source: Ambulatory Visit | Attending: Interventional Cardiology | Admitting: Interventional Cardiology

## 2011-07-14 ENCOUNTER — Encounter (HOSPITAL_COMMUNITY)
Admission: RE | Admit: 2011-07-14 | Discharge: 2011-07-14 | Disposition: A | Discharge status: Home / Self Care | Payer: Self-pay | Source: Ambulatory Visit | Attending: Interventional Cardiology | Admitting: Interventional Cardiology

## 2011-07-16 ENCOUNTER — Encounter (HOSPITAL_COMMUNITY)
Admission: RE | Admit: 2011-07-16 | Discharge: 2011-07-16 | Disposition: A | Discharge status: Home / Self Care | Payer: Self-pay | Source: Ambulatory Visit | Attending: Interventional Cardiology | Admitting: Interventional Cardiology

## 2011-07-19 ENCOUNTER — Encounter (HOSPITAL_COMMUNITY): Payer: Self-pay

## 2011-07-21 ENCOUNTER — Encounter (HOSPITAL_COMMUNITY)
Admission: RE | Admit: 2011-07-21 | Discharge: 2011-07-21 | Disposition: A | Discharge status: Home / Self Care | Payer: Self-pay | Source: Ambulatory Visit | Attending: Interventional Cardiology | Admitting: Interventional Cardiology

## 2011-07-23 ENCOUNTER — Encounter (HOSPITAL_COMMUNITY)
Admission: RE | Admit: 2011-07-23 | Discharge: 2011-07-23 | Disposition: A | Discharge status: Home / Self Care | Payer: Self-pay | Source: Ambulatory Visit | Attending: Interventional Cardiology | Admitting: Interventional Cardiology

## 2011-07-26 ENCOUNTER — Encounter (HOSPITAL_COMMUNITY): Payer: Self-pay

## 2011-07-26 DIAGNOSIS — I059 Rheumatic mitral valve disease, unspecified: Secondary | ICD-10-CM | POA: Insufficient documentation

## 2011-07-26 DIAGNOSIS — Z5189 Encounter for other specified aftercare: Secondary | ICD-10-CM | POA: Insufficient documentation

## 2011-07-26 DIAGNOSIS — E785 Hyperlipidemia, unspecified: Secondary | ICD-10-CM | POA: Insufficient documentation

## 2011-07-26 DIAGNOSIS — Z9889 Other specified postprocedural states: Secondary | ICD-10-CM | POA: Insufficient documentation

## 2011-07-26 DIAGNOSIS — D696 Thrombocytopenia, unspecified: Secondary | ICD-10-CM | POA: Insufficient documentation

## 2011-07-28 ENCOUNTER — Encounter (HOSPITAL_COMMUNITY)
Admission: RE | Admit: 2011-07-28 | Discharge: 2011-07-28 | Disposition: A | Discharge status: Home / Self Care | Payer: Self-pay | Source: Ambulatory Visit | Attending: Interventional Cardiology | Admitting: Interventional Cardiology

## 2011-07-30 ENCOUNTER — Encounter (HOSPITAL_COMMUNITY)
Admission: RE | Admit: 2011-07-30 | Discharge: 2011-07-30 | Disposition: A | Discharge status: Home / Self Care | Payer: Self-pay | Source: Ambulatory Visit | Attending: Interventional Cardiology | Admitting: Interventional Cardiology

## 2011-08-02 ENCOUNTER — Encounter (HOSPITAL_COMMUNITY)
Admission: RE | Admit: 2011-08-02 | Discharge: 2011-08-02 | Disposition: A | Discharge status: Home / Self Care | Payer: Self-pay | Source: Ambulatory Visit | Attending: Interventional Cardiology | Admitting: Interventional Cardiology

## 2011-08-04 ENCOUNTER — Encounter (HOSPITAL_COMMUNITY)
Admission: RE | Admit: 2011-08-04 | Discharge: 2011-08-04 | Disposition: A | Discharge status: Home / Self Care | Payer: Self-pay | Source: Ambulatory Visit | Attending: Interventional Cardiology | Admitting: Interventional Cardiology

## 2011-08-06 ENCOUNTER — Encounter (HOSPITAL_COMMUNITY)
Admission: RE | Admit: 2011-08-06 | Discharge: 2011-08-06 | Disposition: A | Discharge status: Home / Self Care | Payer: Self-pay | Source: Ambulatory Visit | Attending: Interventional Cardiology | Admitting: Interventional Cardiology

## 2011-08-09 ENCOUNTER — Encounter (HOSPITAL_COMMUNITY)
Admission: RE | Admit: 2011-08-09 | Discharge: 2011-08-09 | Disposition: A | Discharge status: Home / Self Care | Payer: Self-pay | Source: Ambulatory Visit | Attending: Interventional Cardiology | Admitting: Interventional Cardiology

## 2011-08-11 ENCOUNTER — Encounter (HOSPITAL_COMMUNITY)
Admission: RE | Admit: 2011-08-11 | Discharge: 2011-08-11 | Disposition: A | Discharge status: Home / Self Care | Payer: Self-pay | Source: Ambulatory Visit | Attending: Interventional Cardiology | Admitting: Interventional Cardiology

## 2011-08-13 ENCOUNTER — Encounter (HOSPITAL_COMMUNITY): Payer: Self-pay

## 2011-08-16 ENCOUNTER — Encounter (HOSPITAL_COMMUNITY): Payer: Self-pay

## 2011-08-18 ENCOUNTER — Encounter (HOSPITAL_COMMUNITY): Payer: Self-pay

## 2011-08-20 ENCOUNTER — Encounter (HOSPITAL_COMMUNITY): Payer: Self-pay

## 2011-08-23 ENCOUNTER — Encounter (HOSPITAL_COMMUNITY)
Admission: RE | Admit: 2011-08-23 | Discharge: 2011-08-23 | Disposition: A | Discharge status: Home / Self Care | Payer: Self-pay | Source: Ambulatory Visit | Attending: Interventional Cardiology | Admitting: Interventional Cardiology

## 2011-08-23 DIAGNOSIS — I059 Rheumatic mitral valve disease, unspecified: Secondary | ICD-10-CM | POA: Insufficient documentation

## 2011-08-23 DIAGNOSIS — Z9889 Other specified postprocedural states: Secondary | ICD-10-CM | POA: Insufficient documentation

## 2011-08-23 DIAGNOSIS — D696 Thrombocytopenia, unspecified: Secondary | ICD-10-CM | POA: Insufficient documentation

## 2011-08-23 DIAGNOSIS — Z5189 Encounter for other specified aftercare: Secondary | ICD-10-CM | POA: Insufficient documentation

## 2011-08-23 DIAGNOSIS — E785 Hyperlipidemia, unspecified: Secondary | ICD-10-CM | POA: Insufficient documentation

## 2011-08-25 ENCOUNTER — Encounter (HOSPITAL_COMMUNITY)
Admission: RE | Admit: 2011-08-25 | Discharge: 2011-08-25 | Disposition: A | Discharge status: Home / Self Care | Payer: Self-pay | Source: Ambulatory Visit | Attending: Interventional Cardiology | Admitting: Interventional Cardiology

## 2011-08-27 ENCOUNTER — Encounter (HOSPITAL_COMMUNITY): Payer: Self-pay

## 2011-08-30 ENCOUNTER — Encounter (HOSPITAL_COMMUNITY)
Admission: RE | Admit: 2011-08-30 | Discharge: 2011-08-30 | Disposition: A | Discharge status: Home / Self Care | Payer: Self-pay | Source: Ambulatory Visit | Attending: Interventional Cardiology | Admitting: Interventional Cardiology

## 2011-09-01 ENCOUNTER — Encounter (HOSPITAL_COMMUNITY): Payer: Self-pay

## 2011-09-03 ENCOUNTER — Encounter (HOSPITAL_COMMUNITY): Payer: Self-pay

## 2011-09-06 ENCOUNTER — Encounter (HOSPITAL_COMMUNITY): Payer: Self-pay

## 2011-09-08 ENCOUNTER — Encounter (HOSPITAL_COMMUNITY): Payer: Self-pay

## 2011-09-10 ENCOUNTER — Encounter (HOSPITAL_COMMUNITY)
Admission: RE | Admit: 2011-09-10 | Discharge: 2011-09-10 | Disposition: A | Discharge status: Home / Self Care | Payer: Self-pay | Source: Ambulatory Visit | Attending: Interventional Cardiology | Admitting: Interventional Cardiology

## 2011-09-13 ENCOUNTER — Encounter (HOSPITAL_COMMUNITY)
Admission: RE | Admit: 2011-09-13 | Discharge: 2011-09-13 | Disposition: A | Discharge status: Home / Self Care | Payer: Self-pay | Source: Ambulatory Visit | Attending: Interventional Cardiology | Admitting: Interventional Cardiology

## 2011-09-15 ENCOUNTER — Encounter (HOSPITAL_COMMUNITY)
Admission: RE | Admit: 2011-09-15 | Discharge: 2011-09-15 | Disposition: A | Discharge status: Home / Self Care | Payer: Self-pay | Source: Ambulatory Visit | Attending: Interventional Cardiology | Admitting: Interventional Cardiology

## 2011-09-17 ENCOUNTER — Encounter (HOSPITAL_COMMUNITY)
Admission: RE | Admit: 2011-09-17 | Discharge: 2011-09-17 | Disposition: A | Discharge status: Home / Self Care | Payer: Self-pay | Source: Ambulatory Visit | Attending: Interventional Cardiology | Admitting: Interventional Cardiology

## 2011-09-20 ENCOUNTER — Encounter (HOSPITAL_COMMUNITY)
Admission: RE | Admit: 2011-09-20 | Discharge: 2011-09-20 | Disposition: A | Discharge status: Home / Self Care | Payer: Self-pay | Source: Ambulatory Visit | Attending: Interventional Cardiology | Admitting: Interventional Cardiology

## 2011-09-22 ENCOUNTER — Encounter (HOSPITAL_COMMUNITY): Payer: Self-pay

## 2011-09-24 ENCOUNTER — Encounter (HOSPITAL_COMMUNITY)
Admission: RE | Admit: 2011-09-24 | Discharge: 2011-09-24 | Disposition: A | Discharge status: Home / Self Care | Payer: Self-pay | Source: Ambulatory Visit | Attending: Interventional Cardiology | Admitting: Interventional Cardiology

## 2011-09-24 DIAGNOSIS — Z5189 Encounter for other specified aftercare: Secondary | ICD-10-CM | POA: Insufficient documentation

## 2011-09-24 DIAGNOSIS — E785 Hyperlipidemia, unspecified: Secondary | ICD-10-CM | POA: Insufficient documentation

## 2011-09-24 DIAGNOSIS — Z9889 Other specified postprocedural states: Secondary | ICD-10-CM | POA: Insufficient documentation

## 2011-09-24 DIAGNOSIS — D696 Thrombocytopenia, unspecified: Secondary | ICD-10-CM | POA: Insufficient documentation

## 2011-09-24 DIAGNOSIS — I059 Rheumatic mitral valve disease, unspecified: Secondary | ICD-10-CM | POA: Insufficient documentation

## 2011-09-27 ENCOUNTER — Encounter (HOSPITAL_COMMUNITY)
Admission: RE | Admit: 2011-09-27 | Discharge: 2011-09-27 | Disposition: A | Discharge status: Home / Self Care | Payer: Self-pay | Source: Ambulatory Visit | Attending: Interventional Cardiology | Admitting: Interventional Cardiology

## 2011-09-29 ENCOUNTER — Encounter (HOSPITAL_COMMUNITY)
Admission: RE | Admit: 2011-09-29 | Discharge: 2011-09-29 | Disposition: A | Discharge status: Home / Self Care | Payer: Self-pay | Source: Ambulatory Visit | Attending: Interventional Cardiology | Admitting: Interventional Cardiology

## 2011-10-01 ENCOUNTER — Encounter (HOSPITAL_COMMUNITY)
Admission: RE | Admit: 2011-10-01 | Discharge: 2011-10-01 | Disposition: A | Discharge status: Home / Self Care | Payer: Self-pay | Source: Ambulatory Visit | Attending: Interventional Cardiology | Admitting: Interventional Cardiology

## 2011-10-04 ENCOUNTER — Encounter (HOSPITAL_COMMUNITY)
Admission: RE | Admit: 2011-10-04 | Discharge: 2011-10-04 | Disposition: A | Discharge status: Home / Self Care | Payer: Self-pay | Source: Ambulatory Visit | Attending: Interventional Cardiology | Admitting: Interventional Cardiology

## 2011-10-06 ENCOUNTER — Encounter (HOSPITAL_COMMUNITY)
Admission: RE | Admit: 2011-10-06 | Discharge: 2011-10-06 | Disposition: A | Discharge status: Home / Self Care | Payer: Self-pay | Source: Ambulatory Visit | Attending: Interventional Cardiology | Admitting: Interventional Cardiology

## 2011-10-08 ENCOUNTER — Encounter (HOSPITAL_COMMUNITY)
Admission: RE | Admit: 2011-10-08 | Discharge: 2011-10-08 | Disposition: A | Discharge status: Home / Self Care | Payer: Self-pay | Source: Ambulatory Visit | Attending: Interventional Cardiology | Admitting: Interventional Cardiology

## 2011-10-11 ENCOUNTER — Encounter (HOSPITAL_COMMUNITY)
Admission: RE | Admit: 2011-10-11 | Discharge: 2011-10-11 | Disposition: A | Discharge status: Home / Self Care | Payer: Self-pay | Source: Ambulatory Visit | Attending: Interventional Cardiology | Admitting: Interventional Cardiology

## 2011-10-13 ENCOUNTER — Encounter (HOSPITAL_COMMUNITY)
Admission: RE | Admit: 2011-10-13 | Discharge: 2011-10-13 | Disposition: A | Discharge status: Home / Self Care | Payer: Self-pay | Source: Ambulatory Visit | Attending: Interventional Cardiology | Admitting: Interventional Cardiology

## 2011-10-15 ENCOUNTER — Encounter (HOSPITAL_COMMUNITY)
Admission: RE | Admit: 2011-10-15 | Discharge: 2011-10-15 | Disposition: A | Discharge status: Home / Self Care | Payer: Self-pay | Source: Ambulatory Visit | Attending: Interventional Cardiology | Admitting: Interventional Cardiology

## 2011-10-18 ENCOUNTER — Encounter (HOSPITAL_COMMUNITY)
Admission: RE | Admit: 2011-10-18 | Discharge: 2011-10-18 | Disposition: A | Discharge status: Home / Self Care | Payer: Self-pay | Source: Ambulatory Visit | Attending: Interventional Cardiology | Admitting: Interventional Cardiology

## 2011-10-20 ENCOUNTER — Encounter (HOSPITAL_COMMUNITY)
Admission: RE | Admit: 2011-10-20 | Discharge: 2011-10-20 | Disposition: A | Discharge status: Home / Self Care | Payer: Self-pay | Source: Ambulatory Visit | Attending: Interventional Cardiology | Admitting: Interventional Cardiology

## 2011-10-22 ENCOUNTER — Encounter (HOSPITAL_COMMUNITY)
Admission: RE | Admit: 2011-10-22 | Discharge: 2011-10-22 | Disposition: A | Discharge status: Home / Self Care | Payer: Self-pay | Source: Ambulatory Visit | Attending: Interventional Cardiology | Admitting: Interventional Cardiology

## 2011-10-27 ENCOUNTER — Encounter (HOSPITAL_COMMUNITY)
Admission: RE | Admit: 2011-10-27 | Discharge: 2011-10-27 | Disposition: A | Discharge status: Home / Self Care | Payer: Self-pay | Source: Ambulatory Visit | Attending: Interventional Cardiology | Admitting: Interventional Cardiology

## 2011-10-27 DIAGNOSIS — Z5189 Encounter for other specified aftercare: Secondary | ICD-10-CM | POA: Insufficient documentation

## 2011-10-27 DIAGNOSIS — D696 Thrombocytopenia, unspecified: Secondary | ICD-10-CM | POA: Insufficient documentation

## 2011-10-27 DIAGNOSIS — I059 Rheumatic mitral valve disease, unspecified: Secondary | ICD-10-CM | POA: Insufficient documentation

## 2011-10-27 DIAGNOSIS — E785 Hyperlipidemia, unspecified: Secondary | ICD-10-CM | POA: Insufficient documentation

## 2011-10-27 DIAGNOSIS — Z9889 Other specified postprocedural states: Secondary | ICD-10-CM | POA: Insufficient documentation

## 2011-10-29 ENCOUNTER — Encounter (HOSPITAL_COMMUNITY)
Admission: RE | Admit: 2011-10-29 | Discharge: 2011-10-29 | Disposition: A | Discharge status: Home / Self Care | Payer: Self-pay | Source: Ambulatory Visit | Attending: Interventional Cardiology | Admitting: Interventional Cardiology

## 2011-11-01 ENCOUNTER — Encounter (HOSPITAL_COMMUNITY)
Admission: RE | Admit: 2011-11-01 | Discharge: 2011-11-01 | Disposition: A | Discharge status: Home / Self Care | Payer: Self-pay | Source: Ambulatory Visit | Attending: Interventional Cardiology | Admitting: Interventional Cardiology

## 2011-11-03 ENCOUNTER — Encounter (HOSPITAL_COMMUNITY)
Admission: RE | Admit: 2011-11-03 | Discharge: 2011-11-03 | Disposition: A | Discharge status: Home / Self Care | Payer: Self-pay | Source: Ambulatory Visit | Attending: Interventional Cardiology | Admitting: Interventional Cardiology

## 2011-11-05 ENCOUNTER — Encounter (HOSPITAL_COMMUNITY)
Admission: RE | Admit: 2011-11-05 | Discharge: 2011-11-05 | Disposition: A | Discharge status: Home / Self Care | Payer: Self-pay | Source: Ambulatory Visit | Attending: Interventional Cardiology | Admitting: Interventional Cardiology

## 2011-11-08 ENCOUNTER — Encounter (HOSPITAL_COMMUNITY)
Admission: RE | Admit: 2011-11-08 | Discharge: 2011-11-08 | Disposition: A | Discharge status: Home / Self Care | Payer: Self-pay | Source: Ambulatory Visit | Attending: Interventional Cardiology | Admitting: Interventional Cardiology

## 2011-11-10 ENCOUNTER — Encounter (HOSPITAL_COMMUNITY)
Admission: RE | Admit: 2011-11-10 | Discharge: 2011-11-10 | Disposition: A | Discharge status: Home / Self Care | Payer: Self-pay | Source: Ambulatory Visit | Attending: Interventional Cardiology | Admitting: Interventional Cardiology

## 2011-11-12 ENCOUNTER — Encounter (HOSPITAL_COMMUNITY)
Admission: RE | Admit: 2011-11-12 | Discharge: 2011-11-12 | Disposition: A | Discharge status: Home / Self Care | Payer: Self-pay | Source: Ambulatory Visit | Attending: Interventional Cardiology | Admitting: Interventional Cardiology

## 2011-11-15 ENCOUNTER — Encounter (HOSPITAL_COMMUNITY)
Admission: RE | Admit: 2011-11-15 | Discharge: 2011-11-15 | Disposition: A | Discharge status: Home / Self Care | Payer: Self-pay | Source: Ambulatory Visit | Attending: Interventional Cardiology | Admitting: Interventional Cardiology

## 2011-11-17 ENCOUNTER — Encounter (HOSPITAL_COMMUNITY)
Admission: RE | Admit: 2011-11-17 | Discharge: 2011-11-17 | Disposition: A | Discharge status: Home / Self Care | Payer: Self-pay | Source: Ambulatory Visit | Attending: Interventional Cardiology | Admitting: Interventional Cardiology

## 2011-11-19 ENCOUNTER — Encounter (HOSPITAL_COMMUNITY)
Admission: RE | Admit: 2011-11-19 | Discharge: 2011-11-19 | Disposition: A | Discharge status: Home / Self Care | Payer: Self-pay | Source: Ambulatory Visit | Attending: Interventional Cardiology | Admitting: Interventional Cardiology

## 2011-11-22 ENCOUNTER — Encounter (HOSPITAL_COMMUNITY)
Admission: RE | Admit: 2011-11-22 | Discharge: 2011-11-22 | Disposition: A | Discharge status: Home / Self Care | Payer: Self-pay | Source: Ambulatory Visit | Attending: Interventional Cardiology | Admitting: Interventional Cardiology

## 2011-11-24 ENCOUNTER — Encounter (HOSPITAL_COMMUNITY)
Admission: RE | Admit: 2011-11-24 | Discharge: 2011-11-24 | Disposition: A | Discharge status: Home / Self Care | Payer: Self-pay | Source: Ambulatory Visit | Attending: Interventional Cardiology | Admitting: Interventional Cardiology

## 2011-11-24 DIAGNOSIS — E785 Hyperlipidemia, unspecified: Secondary | ICD-10-CM | POA: Insufficient documentation

## 2011-11-24 DIAGNOSIS — Z9889 Other specified postprocedural states: Secondary | ICD-10-CM | POA: Insufficient documentation

## 2011-11-24 DIAGNOSIS — D696 Thrombocytopenia, unspecified: Secondary | ICD-10-CM | POA: Insufficient documentation

## 2011-11-24 DIAGNOSIS — I059 Rheumatic mitral valve disease, unspecified: Secondary | ICD-10-CM | POA: Insufficient documentation

## 2011-11-24 DIAGNOSIS — Z5189 Encounter for other specified aftercare: Secondary | ICD-10-CM | POA: Insufficient documentation

## 2011-11-26 ENCOUNTER — Encounter (HOSPITAL_COMMUNITY)
Admission: RE | Admit: 2011-11-26 | Discharge: 2011-11-26 | Disposition: A | Discharge status: Home / Self Care | Payer: Self-pay | Source: Ambulatory Visit | Attending: Interventional Cardiology | Admitting: Interventional Cardiology

## 2011-11-29 ENCOUNTER — Encounter (HOSPITAL_COMMUNITY)
Admission: RE | Admit: 2011-11-29 | Discharge: 2011-11-29 | Disposition: A | Discharge status: Home / Self Care | Payer: Self-pay | Source: Ambulatory Visit | Attending: Interventional Cardiology | Admitting: Interventional Cardiology

## 2011-12-01 ENCOUNTER — Encounter (HOSPITAL_COMMUNITY): Payer: Self-pay

## 2011-12-03 ENCOUNTER — Encounter (HOSPITAL_COMMUNITY)
Admission: RE | Admit: 2011-12-03 | Discharge: 2011-12-03 | Disposition: A | Discharge status: Home / Self Care | Payer: Self-pay | Source: Ambulatory Visit | Attending: Interventional Cardiology | Admitting: Interventional Cardiology

## 2011-12-06 ENCOUNTER — Encounter (HOSPITAL_COMMUNITY)
Admission: RE | Admit: 2011-12-06 | Discharge: 2011-12-06 | Disposition: A | Discharge status: Home / Self Care | Payer: Self-pay | Source: Ambulatory Visit | Attending: Interventional Cardiology | Admitting: Interventional Cardiology

## 2011-12-08 ENCOUNTER — Encounter (HOSPITAL_COMMUNITY)
Admission: RE | Admit: 2011-12-08 | Discharge: 2011-12-08 | Disposition: A | Discharge status: Home / Self Care | Payer: Self-pay | Source: Ambulatory Visit | Attending: Interventional Cardiology | Admitting: Interventional Cardiology

## 2011-12-10 ENCOUNTER — Encounter (HOSPITAL_COMMUNITY)
Admission: RE | Admit: 2011-12-10 | Discharge: 2011-12-10 | Disposition: A | Discharge status: Home / Self Care | Payer: Self-pay | Source: Ambulatory Visit | Attending: Interventional Cardiology | Admitting: Interventional Cardiology

## 2011-12-13 ENCOUNTER — Encounter (HOSPITAL_COMMUNITY)
Admission: RE | Admit: 2011-12-13 | Discharge: 2011-12-13 | Disposition: A | Discharge status: Home / Self Care | Payer: Self-pay | Source: Ambulatory Visit | Attending: Interventional Cardiology | Admitting: Interventional Cardiology

## 2011-12-15 ENCOUNTER — Encounter (HOSPITAL_COMMUNITY)
Admission: RE | Admit: 2011-12-15 | Discharge: 2011-12-15 | Disposition: A | Discharge status: Home / Self Care | Payer: Self-pay | Source: Ambulatory Visit | Attending: Interventional Cardiology | Admitting: Interventional Cardiology

## 2011-12-17 ENCOUNTER — Encounter (HOSPITAL_COMMUNITY)
Admission: RE | Admit: 2011-12-17 | Discharge: 2011-12-17 | Disposition: A | Discharge status: Home / Self Care | Payer: Self-pay | Source: Ambulatory Visit | Attending: Interventional Cardiology | Admitting: Interventional Cardiology

## 2011-12-20 ENCOUNTER — Encounter (HOSPITAL_COMMUNITY)
Admission: RE | Admit: 2011-12-20 | Discharge: 2011-12-20 | Disposition: A | Discharge status: Home / Self Care | Payer: Self-pay | Source: Ambulatory Visit | Attending: Interventional Cardiology | Admitting: Interventional Cardiology

## 2011-12-22 ENCOUNTER — Encounter (HOSPITAL_COMMUNITY)
Admission: RE | Admit: 2011-12-22 | Discharge: 2011-12-22 | Disposition: A | Discharge status: Home / Self Care | Payer: Self-pay | Source: Ambulatory Visit | Attending: Interventional Cardiology | Admitting: Interventional Cardiology

## 2011-12-24 ENCOUNTER — Encounter (HOSPITAL_COMMUNITY): Payer: Self-pay

## 2011-12-24 ENCOUNTER — Encounter (HOSPITAL_COMMUNITY)
Admission: RE | Admit: 2011-12-24 | Discharge: 2011-12-24 | Disposition: A | Discharge status: Home / Self Care | Payer: Self-pay | Source: Ambulatory Visit | Attending: Interventional Cardiology | Admitting: Interventional Cardiology

## 2011-12-24 DIAGNOSIS — Z5189 Encounter for other specified aftercare: Secondary | ICD-10-CM | POA: Insufficient documentation

## 2011-12-24 DIAGNOSIS — I059 Rheumatic mitral valve disease, unspecified: Secondary | ICD-10-CM | POA: Insufficient documentation

## 2011-12-24 DIAGNOSIS — D696 Thrombocytopenia, unspecified: Secondary | ICD-10-CM | POA: Insufficient documentation

## 2011-12-24 DIAGNOSIS — Z9889 Other specified postprocedural states: Secondary | ICD-10-CM | POA: Insufficient documentation

## 2011-12-24 DIAGNOSIS — E785 Hyperlipidemia, unspecified: Secondary | ICD-10-CM | POA: Insufficient documentation

## 2011-12-27 ENCOUNTER — Encounter (HOSPITAL_COMMUNITY): Payer: Self-pay

## 2011-12-27 ENCOUNTER — Encounter (HOSPITAL_COMMUNITY)
Admission: RE | Admit: 2011-12-27 | Discharge: 2011-12-27 | Disposition: A | Discharge status: Home / Self Care | Payer: Self-pay | Source: Ambulatory Visit | Attending: Interventional Cardiology | Admitting: Interventional Cardiology

## 2011-12-29 ENCOUNTER — Encounter (HOSPITAL_COMMUNITY): Payer: Self-pay

## 2011-12-31 ENCOUNTER — Encounter (HOSPITAL_COMMUNITY): Payer: Self-pay

## 2012-01-03 ENCOUNTER — Encounter (HOSPITAL_COMMUNITY): Payer: Self-pay

## 2012-01-03 ENCOUNTER — Encounter (HOSPITAL_COMMUNITY)
Admission: RE | Admit: 2012-01-03 | Discharge: 2012-01-03 | Disposition: A | Discharge status: Home / Self Care | Payer: Self-pay | Source: Ambulatory Visit | Attending: Interventional Cardiology | Admitting: Interventional Cardiology

## 2012-01-05 ENCOUNTER — Encounter (HOSPITAL_COMMUNITY)
Admission: RE | Admit: 2012-01-05 | Discharge: 2012-01-05 | Disposition: A | Discharge status: Home / Self Care | Payer: Self-pay | Source: Ambulatory Visit | Attending: Interventional Cardiology | Admitting: Interventional Cardiology

## 2012-01-05 ENCOUNTER — Encounter (HOSPITAL_COMMUNITY): Payer: Self-pay

## 2012-01-07 ENCOUNTER — Encounter (HOSPITAL_COMMUNITY)
Admission: RE | Admit: 2012-01-07 | Discharge: 2012-01-07 | Disposition: A | Discharge status: Home / Self Care | Payer: Self-pay | Source: Ambulatory Visit | Attending: Interventional Cardiology | Admitting: Interventional Cardiology

## 2012-01-07 ENCOUNTER — Encounter (HOSPITAL_COMMUNITY): Payer: Self-pay

## 2012-01-10 ENCOUNTER — Encounter (HOSPITAL_COMMUNITY)
Admission: RE | Admit: 2012-01-10 | Discharge: 2012-01-10 | Disposition: A | Discharge status: Home / Self Care | Payer: Self-pay | Source: Ambulatory Visit | Attending: Interventional Cardiology | Admitting: Interventional Cardiology

## 2012-01-10 ENCOUNTER — Encounter (HOSPITAL_COMMUNITY): Payer: Self-pay

## 2012-01-12 ENCOUNTER — Encounter (HOSPITAL_COMMUNITY): Payer: Self-pay

## 2012-01-12 ENCOUNTER — Encounter (HOSPITAL_COMMUNITY)
Admission: RE | Admit: 2012-01-12 | Discharge: 2012-01-12 | Disposition: A | Discharge status: Home / Self Care | Payer: Self-pay | Source: Ambulatory Visit | Attending: Interventional Cardiology | Admitting: Interventional Cardiology

## 2012-01-14 ENCOUNTER — Encounter (HOSPITAL_COMMUNITY)
Admission: RE | Admit: 2012-01-14 | Discharge: 2012-01-14 | Disposition: A | Discharge status: Home / Self Care | Payer: Self-pay | Source: Ambulatory Visit | Attending: Interventional Cardiology | Admitting: Interventional Cardiology

## 2012-01-14 ENCOUNTER — Encounter (HOSPITAL_COMMUNITY): Payer: Self-pay

## 2012-01-17 ENCOUNTER — Encounter (HOSPITAL_COMMUNITY): Payer: Self-pay

## 2012-01-17 ENCOUNTER — Encounter (HOSPITAL_COMMUNITY)
Admission: RE | Admit: 2012-01-17 | Discharge: 2012-01-17 | Disposition: A | Discharge status: Home / Self Care | Payer: Self-pay | Source: Ambulatory Visit | Attending: Interventional Cardiology | Admitting: Interventional Cardiology

## 2012-01-19 ENCOUNTER — Encounter (HOSPITAL_COMMUNITY)
Admission: RE | Admit: 2012-01-19 | Discharge: 2012-01-19 | Disposition: A | Discharge status: Home / Self Care | Payer: Self-pay | Source: Ambulatory Visit | Attending: Interventional Cardiology | Admitting: Interventional Cardiology

## 2012-01-19 ENCOUNTER — Encounter (HOSPITAL_COMMUNITY): Payer: Self-pay

## 2012-01-24 ENCOUNTER — Encounter (HOSPITAL_COMMUNITY): Payer: Self-pay

## 2012-01-24 ENCOUNTER — Encounter (HOSPITAL_COMMUNITY)
Admission: RE | Admit: 2012-01-24 | Discharge: 2012-01-24 | Disposition: A | Discharge status: Home / Self Care | Payer: Self-pay | Source: Ambulatory Visit | Attending: Interventional Cardiology | Admitting: Interventional Cardiology

## 2012-01-24 DIAGNOSIS — Z9889 Other specified postprocedural states: Secondary | ICD-10-CM | POA: Insufficient documentation

## 2012-01-24 DIAGNOSIS — D696 Thrombocytopenia, unspecified: Secondary | ICD-10-CM | POA: Insufficient documentation

## 2012-01-24 DIAGNOSIS — Z5189 Encounter for other specified aftercare: Secondary | ICD-10-CM | POA: Insufficient documentation

## 2012-01-24 DIAGNOSIS — I059 Rheumatic mitral valve disease, unspecified: Secondary | ICD-10-CM | POA: Insufficient documentation

## 2012-01-24 DIAGNOSIS — E785 Hyperlipidemia, unspecified: Secondary | ICD-10-CM | POA: Insufficient documentation

## 2012-01-26 ENCOUNTER — Encounter (HOSPITAL_COMMUNITY)
Admission: RE | Admit: 2012-01-26 | Discharge: 2012-01-26 | Disposition: A | Discharge status: Home / Self Care | Payer: Self-pay | Source: Ambulatory Visit | Attending: Interventional Cardiology | Admitting: Interventional Cardiology

## 2012-01-26 ENCOUNTER — Encounter (HOSPITAL_COMMUNITY): Payer: Self-pay

## 2012-01-28 ENCOUNTER — Encounter (HOSPITAL_COMMUNITY): Payer: Self-pay

## 2012-01-28 ENCOUNTER — Encounter (HOSPITAL_COMMUNITY)
Admission: RE | Admit: 2012-01-28 | Discharge: 2012-01-28 | Disposition: A | Discharge status: Home / Self Care | Payer: Self-pay | Source: Ambulatory Visit | Attending: Interventional Cardiology | Admitting: Interventional Cardiology

## 2012-01-31 ENCOUNTER — Encounter (HOSPITAL_COMMUNITY): Payer: Self-pay

## 2012-02-02 ENCOUNTER — Encounter (HOSPITAL_COMMUNITY)
Admission: RE | Admit: 2012-02-02 | Discharge: 2012-02-02 | Disposition: A | Discharge status: Home / Self Care | Payer: Self-pay | Source: Ambulatory Visit | Attending: Interventional Cardiology | Admitting: Interventional Cardiology

## 2012-02-02 ENCOUNTER — Encounter (HOSPITAL_COMMUNITY): Payer: Self-pay

## 2012-02-04 ENCOUNTER — Encounter (HOSPITAL_COMMUNITY): Payer: Self-pay

## 2012-02-04 ENCOUNTER — Encounter (HOSPITAL_COMMUNITY)
Admission: RE | Admit: 2012-02-04 | Discharge: 2012-02-04 | Disposition: A | Discharge status: Home / Self Care | Payer: Self-pay | Source: Ambulatory Visit | Attending: Interventional Cardiology | Admitting: Interventional Cardiology

## 2012-02-07 ENCOUNTER — Encounter (HOSPITAL_COMMUNITY): Payer: Self-pay

## 2012-02-07 ENCOUNTER — Encounter (HOSPITAL_COMMUNITY)
Admission: RE | Admit: 2012-02-07 | Discharge: 2012-02-07 | Disposition: A | Discharge status: Home / Self Care | Payer: Self-pay | Source: Ambulatory Visit | Attending: Interventional Cardiology | Admitting: Interventional Cardiology

## 2012-02-09 ENCOUNTER — Encounter (HOSPITAL_COMMUNITY): Payer: Self-pay

## 2012-02-09 ENCOUNTER — Encounter (HOSPITAL_COMMUNITY)
Admission: RE | Admit: 2012-02-09 | Discharge: 2012-02-09 | Disposition: A | Discharge status: Home / Self Care | Payer: Self-pay | Source: Ambulatory Visit | Attending: Interventional Cardiology | Admitting: Interventional Cardiology

## 2012-02-11 ENCOUNTER — Encounter (HOSPITAL_COMMUNITY)
Admission: RE | Admit: 2012-02-11 | Discharge: 2012-02-11 | Disposition: A | Discharge status: Home / Self Care | Payer: Self-pay | Source: Ambulatory Visit | Attending: Interventional Cardiology | Admitting: Interventional Cardiology

## 2012-02-11 ENCOUNTER — Encounter (HOSPITAL_COMMUNITY): Payer: Self-pay

## 2012-02-14 ENCOUNTER — Encounter (HOSPITAL_COMMUNITY): Payer: Self-pay

## 2012-02-14 ENCOUNTER — Encounter (HOSPITAL_COMMUNITY)
Admission: RE | Admit: 2012-02-14 | Discharge: 2012-02-14 | Disposition: A | Discharge status: Home / Self Care | Payer: Self-pay | Source: Ambulatory Visit | Attending: Interventional Cardiology | Admitting: Interventional Cardiology

## 2012-02-18 ENCOUNTER — Encounter (HOSPITAL_COMMUNITY): Payer: Self-pay

## 2012-02-21 ENCOUNTER — Encounter (HOSPITAL_COMMUNITY)
Admission: RE | Admit: 2012-02-21 | Discharge: 2012-02-21 | Disposition: A | Discharge status: Home / Self Care | Payer: Self-pay | Source: Ambulatory Visit | Attending: Interventional Cardiology | Admitting: Interventional Cardiology

## 2012-02-21 ENCOUNTER — Encounter (HOSPITAL_COMMUNITY): Payer: Self-pay

## 2012-02-25 ENCOUNTER — Encounter (HOSPITAL_COMMUNITY)
Admission: RE | Admit: 2012-02-25 | Discharge: 2012-02-25 | Disposition: A | Discharge status: Home / Self Care | Payer: Self-pay | Source: Ambulatory Visit | Attending: Interventional Cardiology | Admitting: Interventional Cardiology

## 2012-02-25 ENCOUNTER — Encounter (HOSPITAL_COMMUNITY): Payer: Self-pay

## 2012-02-25 DIAGNOSIS — Z9889 Other specified postprocedural states: Secondary | ICD-10-CM | POA: Insufficient documentation

## 2012-02-25 DIAGNOSIS — I059 Rheumatic mitral valve disease, unspecified: Secondary | ICD-10-CM | POA: Insufficient documentation

## 2012-02-25 DIAGNOSIS — D696 Thrombocytopenia, unspecified: Secondary | ICD-10-CM | POA: Insufficient documentation

## 2012-02-25 DIAGNOSIS — Z5189 Encounter for other specified aftercare: Secondary | ICD-10-CM | POA: Insufficient documentation

## 2012-02-25 DIAGNOSIS — E785 Hyperlipidemia, unspecified: Secondary | ICD-10-CM | POA: Insufficient documentation

## 2012-02-28 ENCOUNTER — Encounter (HOSPITAL_COMMUNITY): Payer: Self-pay

## 2012-02-28 ENCOUNTER — Encounter (HOSPITAL_COMMUNITY)
Admission: RE | Admit: 2012-02-28 | Discharge: 2012-02-28 | Disposition: A | Discharge status: Home / Self Care | Payer: Self-pay | Source: Ambulatory Visit | Attending: Interventional Cardiology | Admitting: Interventional Cardiology

## 2012-03-01 ENCOUNTER — Encounter (HOSPITAL_COMMUNITY): Payer: Self-pay

## 2012-03-01 ENCOUNTER — Encounter (HOSPITAL_COMMUNITY)
Admission: RE | Admit: 2012-03-01 | Discharge: 2012-03-01 | Disposition: A | Discharge status: Home / Self Care | Payer: Self-pay | Source: Ambulatory Visit | Attending: Interventional Cardiology | Admitting: Interventional Cardiology

## 2012-03-03 ENCOUNTER — Encounter (HOSPITAL_COMMUNITY): Payer: Self-pay

## 2012-03-06 ENCOUNTER — Encounter (HOSPITAL_COMMUNITY)
Admission: RE | Admit: 2012-03-06 | Discharge: 2012-03-06 | Disposition: A | Discharge status: Home / Self Care | Payer: Self-pay | Source: Ambulatory Visit | Attending: Interventional Cardiology | Admitting: Interventional Cardiology

## 2012-03-06 ENCOUNTER — Encounter (HOSPITAL_COMMUNITY): Payer: Self-pay

## 2012-03-08 ENCOUNTER — Encounter (HOSPITAL_COMMUNITY): Payer: Self-pay

## 2012-03-08 ENCOUNTER — Encounter (HOSPITAL_COMMUNITY)
Admission: RE | Admit: 2012-03-08 | Discharge: 2012-03-08 | Disposition: A | Discharge status: Home / Self Care | Payer: Self-pay | Source: Ambulatory Visit | Attending: Interventional Cardiology | Admitting: Interventional Cardiology

## 2012-03-10 ENCOUNTER — Encounter (HOSPITAL_COMMUNITY)
Admission: RE | Admit: 2012-03-10 | Discharge: 2012-03-10 | Disposition: A | Discharge status: Home / Self Care | Payer: Self-pay | Source: Ambulatory Visit | Attending: Interventional Cardiology | Admitting: Interventional Cardiology

## 2012-03-10 ENCOUNTER — Encounter (HOSPITAL_COMMUNITY): Payer: Self-pay

## 2012-03-13 ENCOUNTER — Encounter (HOSPITAL_COMMUNITY): Payer: Self-pay

## 2012-03-13 ENCOUNTER — Encounter (HOSPITAL_COMMUNITY)
Admission: RE | Admit: 2012-03-13 | Discharge: 2012-03-13 | Disposition: A | Discharge status: Home / Self Care | Payer: Self-pay | Source: Ambulatory Visit | Attending: Interventional Cardiology | Admitting: Interventional Cardiology

## 2012-03-15 ENCOUNTER — Encounter (HOSPITAL_COMMUNITY)
Admission: RE | Admit: 2012-03-15 | Discharge: 2012-03-15 | Disposition: A | Discharge status: Home / Self Care | Payer: Self-pay | Source: Ambulatory Visit | Attending: Interventional Cardiology | Admitting: Interventional Cardiology

## 2012-03-15 ENCOUNTER — Encounter (HOSPITAL_COMMUNITY): Payer: Self-pay

## 2012-03-17 ENCOUNTER — Encounter (HOSPITAL_COMMUNITY): Payer: Self-pay

## 2012-03-17 ENCOUNTER — Encounter (HOSPITAL_COMMUNITY)
Admission: RE | Admit: 2012-03-17 | Discharge: 2012-03-17 | Disposition: A | Discharge status: Home / Self Care | Payer: Self-pay | Source: Ambulatory Visit | Attending: Interventional Cardiology | Admitting: Interventional Cardiology

## 2012-03-20 ENCOUNTER — Encounter (HOSPITAL_COMMUNITY): Payer: Self-pay

## 2012-03-20 ENCOUNTER — Encounter (HOSPITAL_COMMUNITY)
Admission: RE | Admit: 2012-03-20 | Discharge: 2012-03-20 | Disposition: A | Discharge status: Home / Self Care | Payer: Self-pay | Source: Ambulatory Visit | Attending: Interventional Cardiology | Admitting: Interventional Cardiology

## 2012-03-22 ENCOUNTER — Encounter (HOSPITAL_COMMUNITY): Payer: Self-pay

## 2012-03-24 ENCOUNTER — Encounter (HOSPITAL_COMMUNITY): Payer: Self-pay

## 2012-03-24 ENCOUNTER — Encounter (HOSPITAL_COMMUNITY)
Admission: RE | Admit: 2012-03-24 | Discharge: 2012-03-24 | Disposition: A | Discharge status: Home / Self Care | Payer: Self-pay | Source: Ambulatory Visit | Attending: Interventional Cardiology | Admitting: Interventional Cardiology

## 2012-03-27 ENCOUNTER — Encounter (HOSPITAL_COMMUNITY): Payer: Self-pay

## 2012-03-27 DIAGNOSIS — D696 Thrombocytopenia, unspecified: Secondary | ICD-10-CM | POA: Insufficient documentation

## 2012-03-27 DIAGNOSIS — E785 Hyperlipidemia, unspecified: Secondary | ICD-10-CM | POA: Insufficient documentation

## 2012-03-27 DIAGNOSIS — I059 Rheumatic mitral valve disease, unspecified: Secondary | ICD-10-CM | POA: Insufficient documentation

## 2012-03-27 DIAGNOSIS — Z5189 Encounter for other specified aftercare: Secondary | ICD-10-CM | POA: Insufficient documentation

## 2012-03-27 DIAGNOSIS — Z9889 Other specified postprocedural states: Secondary | ICD-10-CM | POA: Insufficient documentation

## 2012-03-29 ENCOUNTER — Encounter (HOSPITAL_COMMUNITY): Payer: Self-pay

## 2012-03-31 ENCOUNTER — Encounter (HOSPITAL_COMMUNITY): Payer: Self-pay

## 2012-04-03 ENCOUNTER — Encounter (HOSPITAL_COMMUNITY): Payer: Self-pay

## 2012-04-05 ENCOUNTER — Encounter (HOSPITAL_COMMUNITY): Payer: Self-pay

## 2012-04-07 ENCOUNTER — Encounter (HOSPITAL_COMMUNITY): Payer: Self-pay

## 2012-04-10 ENCOUNTER — Encounter (HOSPITAL_COMMUNITY): Payer: Self-pay

## 2012-04-12 ENCOUNTER — Encounter (HOSPITAL_COMMUNITY): Payer: Self-pay

## 2012-04-14 ENCOUNTER — Encounter (HOSPITAL_COMMUNITY): Payer: Self-pay

## 2012-04-17 ENCOUNTER — Encounter (HOSPITAL_COMMUNITY): Payer: Self-pay

## 2012-04-17 ENCOUNTER — Encounter (HOSPITAL_COMMUNITY)
Admission: RE | Admit: 2012-04-17 | Discharge: 2012-04-17 | Disposition: A | Discharge status: Home / Self Care | Payer: Self-pay | Source: Ambulatory Visit | Attending: Interventional Cardiology | Admitting: Interventional Cardiology

## 2012-04-19 ENCOUNTER — Encounter (HOSPITAL_COMMUNITY): Payer: Self-pay

## 2012-04-19 ENCOUNTER — Encounter (HOSPITAL_COMMUNITY)
Admission: RE | Admit: 2012-04-19 | Discharge: 2012-04-19 | Disposition: A | Discharge status: Home / Self Care | Payer: Self-pay | Source: Ambulatory Visit | Attending: Interventional Cardiology | Admitting: Interventional Cardiology

## 2012-04-21 ENCOUNTER — Encounter (HOSPITAL_COMMUNITY)
Admission: RE | Admit: 2012-04-21 | Discharge: 2012-04-21 | Disposition: A | Discharge status: Home / Self Care | Payer: Self-pay | Source: Ambulatory Visit | Attending: Interventional Cardiology | Admitting: Interventional Cardiology

## 2012-04-21 ENCOUNTER — Encounter (HOSPITAL_COMMUNITY): Payer: Self-pay

## 2012-04-24 ENCOUNTER — Encounter (HOSPITAL_COMMUNITY): Payer: Self-pay

## 2012-04-24 ENCOUNTER — Encounter (HOSPITAL_COMMUNITY)
Admission: RE | Admit: 2012-04-24 | Discharge: 2012-04-24 | Disposition: A | Discharge status: Home / Self Care | Payer: Self-pay | Source: Ambulatory Visit | Attending: Interventional Cardiology | Admitting: Interventional Cardiology

## 2012-04-24 DIAGNOSIS — Z9889 Other specified postprocedural states: Secondary | ICD-10-CM | POA: Insufficient documentation

## 2012-04-24 DIAGNOSIS — D696 Thrombocytopenia, unspecified: Secondary | ICD-10-CM | POA: Insufficient documentation

## 2012-04-24 DIAGNOSIS — Z5189 Encounter for other specified aftercare: Secondary | ICD-10-CM | POA: Insufficient documentation

## 2012-04-24 DIAGNOSIS — I059 Rheumatic mitral valve disease, unspecified: Secondary | ICD-10-CM | POA: Insufficient documentation

## 2012-04-24 DIAGNOSIS — E785 Hyperlipidemia, unspecified: Secondary | ICD-10-CM | POA: Insufficient documentation

## 2012-04-26 ENCOUNTER — Encounter (HOSPITAL_COMMUNITY): Payer: Self-pay

## 2012-04-26 ENCOUNTER — Encounter (HOSPITAL_COMMUNITY)
Admission: RE | Admit: 2012-04-26 | Discharge: 2012-04-26 | Disposition: A | Discharge status: Home / Self Care | Payer: Self-pay | Source: Ambulatory Visit | Attending: Interventional Cardiology | Admitting: Interventional Cardiology

## 2012-04-28 ENCOUNTER — Encounter (HOSPITAL_COMMUNITY): Payer: Self-pay

## 2012-05-01 ENCOUNTER — Encounter (HOSPITAL_COMMUNITY): Payer: Self-pay

## 2012-05-01 ENCOUNTER — Encounter (HOSPITAL_COMMUNITY)
Admission: RE | Admit: 2012-05-01 | Discharge: 2012-05-01 | Disposition: A | Discharge status: Home / Self Care | Payer: Self-pay | Source: Ambulatory Visit | Attending: Interventional Cardiology | Admitting: Interventional Cardiology

## 2012-05-03 ENCOUNTER — Encounter (HOSPITAL_COMMUNITY)
Admission: RE | Admit: 2012-05-03 | Discharge: 2012-05-03 | Disposition: A | Discharge status: Home / Self Care | Payer: Self-pay | Source: Ambulatory Visit | Attending: Interventional Cardiology | Admitting: Interventional Cardiology

## 2012-05-03 ENCOUNTER — Encounter (HOSPITAL_COMMUNITY): Payer: Self-pay

## 2012-05-05 ENCOUNTER — Encounter (HOSPITAL_COMMUNITY): Payer: Self-pay

## 2012-05-05 ENCOUNTER — Encounter (HOSPITAL_COMMUNITY)
Admission: RE | Admit: 2012-05-05 | Discharge: 2012-05-05 | Disposition: A | Discharge status: Home / Self Care | Payer: Self-pay | Source: Ambulatory Visit | Attending: Interventional Cardiology | Admitting: Interventional Cardiology

## 2012-05-08 ENCOUNTER — Encounter (HOSPITAL_COMMUNITY)
Admission: RE | Admit: 2012-05-08 | Discharge: 2012-05-08 | Disposition: A | Discharge status: Home / Self Care | Payer: Self-pay | Source: Ambulatory Visit | Attending: Interventional Cardiology | Admitting: Interventional Cardiology

## 2012-05-08 ENCOUNTER — Encounter (HOSPITAL_COMMUNITY): Payer: Self-pay

## 2012-05-10 ENCOUNTER — Encounter (HOSPITAL_COMMUNITY)
Admission: RE | Admit: 2012-05-10 | Discharge: 2012-05-10 | Disposition: A | Discharge status: Home / Self Care | Payer: Self-pay | Source: Ambulatory Visit | Attending: Interventional Cardiology | Admitting: Interventional Cardiology

## 2012-05-10 ENCOUNTER — Encounter (HOSPITAL_COMMUNITY): Payer: Self-pay

## 2012-05-12 ENCOUNTER — Encounter (HOSPITAL_COMMUNITY): Payer: Self-pay

## 2012-05-15 ENCOUNTER — Encounter (HOSPITAL_COMMUNITY)
Admission: RE | Admit: 2012-05-15 | Discharge: 2012-05-15 | Disposition: A | Discharge status: Home / Self Care | Payer: Self-pay | Source: Ambulatory Visit | Attending: Interventional Cardiology | Admitting: Interventional Cardiology

## 2012-05-15 ENCOUNTER — Encounter (HOSPITAL_COMMUNITY): Payer: Self-pay

## 2012-05-17 ENCOUNTER — Encounter (HOSPITAL_COMMUNITY)
Admission: RE | Admit: 2012-05-17 | Discharge: 2012-05-17 | Disposition: A | Discharge status: Home / Self Care | Payer: Self-pay | Source: Ambulatory Visit | Attending: Interventional Cardiology | Admitting: Interventional Cardiology

## 2012-05-17 ENCOUNTER — Encounter (HOSPITAL_COMMUNITY): Payer: Self-pay

## 2012-05-19 ENCOUNTER — Encounter (HOSPITAL_COMMUNITY): Payer: Self-pay

## 2012-05-19 ENCOUNTER — Encounter (HOSPITAL_COMMUNITY)
Admission: RE | Admit: 2012-05-19 | Discharge: 2012-05-19 | Disposition: A | Discharge status: Home / Self Care | Payer: Self-pay | Source: Ambulatory Visit | Attending: Interventional Cardiology | Admitting: Interventional Cardiology

## 2012-05-22 ENCOUNTER — Encounter (HOSPITAL_COMMUNITY)
Admission: RE | Admit: 2012-05-22 | Discharge: 2012-05-22 | Disposition: A | Discharge status: Home / Self Care | Payer: Self-pay | Source: Ambulatory Visit | Attending: Interventional Cardiology | Admitting: Interventional Cardiology

## 2012-05-22 ENCOUNTER — Encounter (HOSPITAL_COMMUNITY): Payer: Self-pay

## 2012-05-24 ENCOUNTER — Encounter (HOSPITAL_COMMUNITY)
Admission: RE | Admit: 2012-05-24 | Discharge: 2012-05-24 | Disposition: A | Discharge status: Home / Self Care | Payer: Self-pay | Source: Ambulatory Visit | Attending: Interventional Cardiology | Admitting: Interventional Cardiology

## 2012-05-24 ENCOUNTER — Encounter (HOSPITAL_COMMUNITY): Payer: Self-pay

## 2012-05-24 DIAGNOSIS — D696 Thrombocytopenia, unspecified: Secondary | ICD-10-CM | POA: Insufficient documentation

## 2012-05-24 DIAGNOSIS — Z9889 Other specified postprocedural states: Secondary | ICD-10-CM | POA: Insufficient documentation

## 2012-05-24 DIAGNOSIS — I059 Rheumatic mitral valve disease, unspecified: Secondary | ICD-10-CM | POA: Insufficient documentation

## 2012-05-24 DIAGNOSIS — Z5189 Encounter for other specified aftercare: Secondary | ICD-10-CM | POA: Insufficient documentation

## 2012-05-24 DIAGNOSIS — E785 Hyperlipidemia, unspecified: Secondary | ICD-10-CM | POA: Insufficient documentation

## 2012-05-26 ENCOUNTER — Encounter (HOSPITAL_COMMUNITY)
Admission: RE | Admit: 2012-05-26 | Discharge: 2012-05-26 | Disposition: A | Discharge status: Home / Self Care | Payer: Self-pay | Source: Ambulatory Visit | Attending: Interventional Cardiology | Admitting: Interventional Cardiology

## 2012-05-26 ENCOUNTER — Encounter (HOSPITAL_COMMUNITY): Payer: Self-pay

## 2012-05-29 ENCOUNTER — Encounter (HOSPITAL_COMMUNITY)
Admission: RE | Admit: 2012-05-29 | Discharge: 2012-05-29 | Disposition: A | Discharge status: Home / Self Care | Payer: Self-pay | Source: Ambulatory Visit | Attending: Interventional Cardiology | Admitting: Interventional Cardiology

## 2012-05-29 ENCOUNTER — Encounter (HOSPITAL_COMMUNITY): Payer: Self-pay

## 2012-05-31 ENCOUNTER — Encounter (HOSPITAL_COMMUNITY)
Admission: RE | Admit: 2012-05-31 | Discharge: 2012-05-31 | Disposition: A | Discharge status: Home / Self Care | Payer: Self-pay | Source: Ambulatory Visit | Attending: Interventional Cardiology | Admitting: Interventional Cardiology

## 2012-05-31 ENCOUNTER — Encounter (HOSPITAL_COMMUNITY): Payer: Self-pay

## 2012-06-02 ENCOUNTER — Encounter (HOSPITAL_COMMUNITY)
Admission: RE | Admit: 2012-06-02 | Discharge: 2012-06-02 | Disposition: A | Discharge status: Home / Self Care | Payer: Self-pay | Source: Ambulatory Visit | Attending: Interventional Cardiology | Admitting: Interventional Cardiology

## 2012-06-02 ENCOUNTER — Encounter (HOSPITAL_COMMUNITY): Payer: Self-pay

## 2012-06-05 ENCOUNTER — Encounter (HOSPITAL_COMMUNITY): Payer: Self-pay

## 2012-06-07 ENCOUNTER — Encounter (HOSPITAL_COMMUNITY): Payer: Self-pay

## 2012-06-07 ENCOUNTER — Encounter (HOSPITAL_COMMUNITY)
Admission: RE | Admit: 2012-06-07 | Discharge: 2012-06-07 | Disposition: A | Discharge status: Home / Self Care | Payer: Self-pay | Source: Ambulatory Visit | Attending: Interventional Cardiology | Admitting: Interventional Cardiology

## 2012-06-09 ENCOUNTER — Encounter (HOSPITAL_COMMUNITY)
Admission: RE | Admit: 2012-06-09 | Discharge: 2012-06-09 | Disposition: A | Discharge status: Home / Self Care | Payer: Self-pay | Source: Ambulatory Visit | Attending: Interventional Cardiology | Admitting: Interventional Cardiology

## 2012-06-09 ENCOUNTER — Encounter (HOSPITAL_COMMUNITY): Payer: Self-pay

## 2012-06-12 ENCOUNTER — Encounter (HOSPITAL_COMMUNITY)
Admission: RE | Admit: 2012-06-12 | Discharge: 2012-06-12 | Disposition: A | Discharge status: Home / Self Care | Payer: Self-pay | Source: Ambulatory Visit | Attending: Interventional Cardiology | Admitting: Interventional Cardiology

## 2012-06-12 ENCOUNTER — Encounter (HOSPITAL_COMMUNITY): Payer: Self-pay

## 2012-06-14 ENCOUNTER — Encounter (HOSPITAL_COMMUNITY)
Admission: RE | Admit: 2012-06-14 | Discharge: 2012-06-14 | Disposition: A | Discharge status: Home / Self Care | Payer: Self-pay | Source: Ambulatory Visit | Attending: Interventional Cardiology | Admitting: Interventional Cardiology

## 2012-06-14 ENCOUNTER — Encounter (HOSPITAL_COMMUNITY): Payer: Self-pay

## 2012-06-16 ENCOUNTER — Encounter (HOSPITAL_COMMUNITY): Payer: Self-pay

## 2012-06-19 ENCOUNTER — Encounter (HOSPITAL_COMMUNITY): Payer: Self-pay

## 2012-06-21 ENCOUNTER — Encounter (HOSPITAL_COMMUNITY): Payer: Self-pay

## 2012-06-21 ENCOUNTER — Encounter (HOSPITAL_COMMUNITY)
Admission: RE | Admit: 2012-06-21 | Discharge: 2012-06-21 | Disposition: A | Discharge status: Home / Self Care | Payer: Self-pay | Source: Ambulatory Visit | Attending: Interventional Cardiology | Admitting: Interventional Cardiology

## 2012-06-22 ENCOUNTER — Ambulatory Visit
Admission: RE | Admit: 2012-06-22 | Discharge: 2012-06-22 | Disposition: A | Discharge status: Home / Self Care | Payer: BC Managed Care – PPO | Source: Ambulatory Visit | Attending: Dermatology | Admitting: Dermatology

## 2012-06-22 ENCOUNTER — Other Ambulatory Visit: Payer: Self-pay | Admitting: Dermatology

## 2012-06-22 DIAGNOSIS — L299 Pruritus, unspecified: Secondary | ICD-10-CM

## 2012-06-23 ENCOUNTER — Encounter (HOSPITAL_COMMUNITY): Payer: Self-pay

## 2012-06-23 DIAGNOSIS — E785 Hyperlipidemia, unspecified: Secondary | ICD-10-CM | POA: Insufficient documentation

## 2012-06-23 DIAGNOSIS — Z9889 Other specified postprocedural states: Secondary | ICD-10-CM | POA: Insufficient documentation

## 2012-06-23 DIAGNOSIS — Z5189 Encounter for other specified aftercare: Secondary | ICD-10-CM | POA: Insufficient documentation

## 2012-06-23 DIAGNOSIS — D696 Thrombocytopenia, unspecified: Secondary | ICD-10-CM | POA: Insufficient documentation

## 2012-06-23 DIAGNOSIS — I059 Rheumatic mitral valve disease, unspecified: Secondary | ICD-10-CM | POA: Insufficient documentation

## 2012-06-26 ENCOUNTER — Encounter (HOSPITAL_COMMUNITY): Payer: Self-pay

## 2012-06-26 ENCOUNTER — Encounter (HOSPITAL_COMMUNITY)
Admission: RE | Admit: 2012-06-26 | Discharge: 2012-06-26 | Disposition: A | Discharge status: Home / Self Care | Payer: Self-pay | Source: Ambulatory Visit | Attending: Interventional Cardiology | Admitting: Interventional Cardiology

## 2012-06-28 ENCOUNTER — Encounter (HOSPITAL_COMMUNITY)
Admission: RE | Admit: 2012-06-28 | Discharge: 2012-06-28 | Disposition: A | Discharge status: Home / Self Care | Payer: Self-pay | Source: Ambulatory Visit | Attending: Interventional Cardiology | Admitting: Interventional Cardiology

## 2012-06-28 ENCOUNTER — Encounter (HOSPITAL_COMMUNITY): Payer: Self-pay

## 2012-06-30 ENCOUNTER — Encounter (HOSPITAL_COMMUNITY): Payer: Self-pay

## 2012-07-03 ENCOUNTER — Encounter (HOSPITAL_COMMUNITY): Payer: Self-pay

## 2012-07-05 ENCOUNTER — Encounter (HOSPITAL_COMMUNITY): Payer: Self-pay

## 2012-07-05 ENCOUNTER — Encounter (HOSPITAL_COMMUNITY)
Admission: RE | Admit: 2012-07-05 | Discharge: 2012-07-05 | Disposition: A | Discharge status: Home / Self Care | Payer: Self-pay | Source: Ambulatory Visit | Attending: Interventional Cardiology | Admitting: Interventional Cardiology

## 2012-07-07 ENCOUNTER — Encounter (HOSPITAL_COMMUNITY): Payer: Self-pay

## 2012-07-07 ENCOUNTER — Encounter (HOSPITAL_COMMUNITY)
Admission: RE | Admit: 2012-07-07 | Discharge: 2012-07-07 | Disposition: A | Discharge status: Home / Self Care | Payer: Self-pay | Source: Ambulatory Visit | Attending: Interventional Cardiology | Admitting: Interventional Cardiology

## 2012-07-10 ENCOUNTER — Encounter (HOSPITAL_COMMUNITY): Payer: Self-pay

## 2012-07-10 ENCOUNTER — Encounter (HOSPITAL_COMMUNITY)
Admission: RE | Admit: 2012-07-10 | Discharge: 2012-07-10 | Disposition: A | Discharge status: Home / Self Care | Payer: Self-pay | Source: Ambulatory Visit | Attending: Interventional Cardiology | Admitting: Interventional Cardiology

## 2012-07-12 ENCOUNTER — Encounter (HOSPITAL_COMMUNITY)
Admission: RE | Admit: 2012-07-12 | Discharge: 2012-07-12 | Disposition: A | Discharge status: Home / Self Care | Payer: Self-pay | Source: Ambulatory Visit | Attending: Interventional Cardiology | Admitting: Interventional Cardiology

## 2012-07-12 ENCOUNTER — Encounter (HOSPITAL_COMMUNITY): Payer: Self-pay

## 2012-07-14 ENCOUNTER — Encounter (HOSPITAL_COMMUNITY): Payer: Self-pay

## 2012-07-14 ENCOUNTER — Encounter (HOSPITAL_COMMUNITY)
Admission: RE | Admit: 2012-07-14 | Discharge: 2012-07-14 | Disposition: A | Discharge status: Home / Self Care | Payer: Self-pay | Source: Ambulatory Visit | Attending: Interventional Cardiology | Admitting: Interventional Cardiology

## 2012-07-19 ENCOUNTER — Encounter (HOSPITAL_COMMUNITY)
Admission: RE | Admit: 2012-07-19 | Discharge: 2012-07-19 | Disposition: A | Discharge status: Home / Self Care | Payer: Self-pay | Source: Ambulatory Visit | Attending: Interventional Cardiology | Admitting: Interventional Cardiology

## 2012-07-19 ENCOUNTER — Encounter (HOSPITAL_COMMUNITY): Payer: Self-pay

## 2012-07-21 ENCOUNTER — Encounter (HOSPITAL_COMMUNITY)
Admission: RE | Admit: 2012-07-21 | Discharge: 2012-07-21 | Disposition: A | Discharge status: Home / Self Care | Payer: Self-pay | Source: Ambulatory Visit | Attending: Interventional Cardiology | Admitting: Interventional Cardiology

## 2012-07-21 ENCOUNTER — Encounter (HOSPITAL_COMMUNITY): Payer: Self-pay

## 2012-07-24 ENCOUNTER — Encounter (HOSPITAL_COMMUNITY): Payer: Self-pay

## 2012-07-24 ENCOUNTER — Encounter (HOSPITAL_COMMUNITY)
Admission: RE | Admit: 2012-07-24 | Discharge: 2012-07-24 | Disposition: A | Discharge status: Home / Self Care | Payer: Self-pay | Source: Ambulatory Visit | Attending: Interventional Cardiology | Admitting: Interventional Cardiology

## 2012-07-24 DIAGNOSIS — E785 Hyperlipidemia, unspecified: Secondary | ICD-10-CM | POA: Insufficient documentation

## 2012-07-24 DIAGNOSIS — Z9889 Other specified postprocedural states: Secondary | ICD-10-CM | POA: Insufficient documentation

## 2012-07-24 DIAGNOSIS — D696 Thrombocytopenia, unspecified: Secondary | ICD-10-CM | POA: Insufficient documentation

## 2012-07-24 DIAGNOSIS — I059 Rheumatic mitral valve disease, unspecified: Secondary | ICD-10-CM | POA: Insufficient documentation

## 2012-07-24 DIAGNOSIS — Z5189 Encounter for other specified aftercare: Secondary | ICD-10-CM | POA: Insufficient documentation

## 2012-07-26 ENCOUNTER — Encounter (HOSPITAL_COMMUNITY)
Admission: RE | Admit: 2012-07-26 | Discharge: 2012-07-26 | Disposition: A | Discharge status: Home / Self Care | Payer: Self-pay | Source: Ambulatory Visit | Attending: Interventional Cardiology | Admitting: Interventional Cardiology

## 2012-07-26 ENCOUNTER — Encounter (HOSPITAL_COMMUNITY): Payer: Self-pay

## 2012-07-28 ENCOUNTER — Encounter (HOSPITAL_COMMUNITY): Payer: Self-pay

## 2012-07-28 ENCOUNTER — Encounter (HOSPITAL_COMMUNITY)
Admission: RE | Admit: 2012-07-28 | Discharge: 2012-07-28 | Disposition: A | Discharge status: Home / Self Care | Payer: Self-pay | Source: Ambulatory Visit | Attending: Interventional Cardiology | Admitting: Interventional Cardiology

## 2012-07-31 ENCOUNTER — Encounter (HOSPITAL_COMMUNITY): Payer: Self-pay

## 2012-07-31 ENCOUNTER — Encounter (HOSPITAL_COMMUNITY)
Admission: RE | Admit: 2012-07-31 | Discharge: 2012-07-31 | Disposition: A | Discharge status: Home / Self Care | Payer: Self-pay | Source: Ambulatory Visit | Attending: Interventional Cardiology | Admitting: Interventional Cardiology

## 2012-08-02 ENCOUNTER — Encounter (HOSPITAL_COMMUNITY)
Admission: RE | Admit: 2012-08-02 | Discharge: 2012-08-02 | Disposition: A | Discharge status: Home / Self Care | Payer: Self-pay | Source: Ambulatory Visit | Attending: Interventional Cardiology | Admitting: Interventional Cardiology

## 2012-08-02 ENCOUNTER — Encounter (HOSPITAL_COMMUNITY): Payer: Self-pay

## 2012-08-04 ENCOUNTER — Encounter (HOSPITAL_COMMUNITY): Payer: Self-pay

## 2012-08-04 ENCOUNTER — Encounter (HOSPITAL_COMMUNITY)
Admission: RE | Admit: 2012-08-04 | Discharge: 2012-08-04 | Disposition: A | Discharge status: Home / Self Care | Payer: Self-pay | Source: Ambulatory Visit | Attending: Interventional Cardiology | Admitting: Interventional Cardiology

## 2012-08-07 ENCOUNTER — Encounter (HOSPITAL_COMMUNITY)
Admission: RE | Admit: 2012-08-07 | Discharge: 2012-08-07 | Disposition: A | Discharge status: Home / Self Care | Payer: Self-pay | Source: Ambulatory Visit | Attending: Interventional Cardiology | Admitting: Interventional Cardiology

## 2012-08-07 ENCOUNTER — Encounter (HOSPITAL_COMMUNITY): Payer: Self-pay

## 2012-08-09 ENCOUNTER — Encounter (HOSPITAL_COMMUNITY)
Admission: RE | Admit: 2012-08-09 | Discharge: 2012-08-09 | Disposition: A | Discharge status: Home / Self Care | Payer: Self-pay | Source: Ambulatory Visit | Attending: Interventional Cardiology | Admitting: Interventional Cardiology

## 2012-08-09 ENCOUNTER — Encounter (HOSPITAL_COMMUNITY): Payer: Self-pay

## 2012-08-11 ENCOUNTER — Encounter (HOSPITAL_COMMUNITY): Payer: Self-pay

## 2012-08-11 ENCOUNTER — Encounter (HOSPITAL_COMMUNITY)
Admission: RE | Admit: 2012-08-11 | Discharge: 2012-08-11 | Disposition: A | Discharge status: Home / Self Care | Payer: Self-pay | Source: Ambulatory Visit | Attending: Interventional Cardiology | Admitting: Interventional Cardiology

## 2012-08-14 ENCOUNTER — Encounter (HOSPITAL_COMMUNITY): Payer: Self-pay

## 2012-08-16 ENCOUNTER — Encounter (HOSPITAL_COMMUNITY): Payer: Self-pay

## 2012-08-16 ENCOUNTER — Encounter (HOSPITAL_COMMUNITY)
Admission: RE | Admit: 2012-08-16 | Discharge: 2012-08-16 | Disposition: A | Discharge status: Home / Self Care | Payer: Self-pay | Source: Ambulatory Visit | Attending: Interventional Cardiology | Admitting: Interventional Cardiology

## 2012-08-18 ENCOUNTER — Encounter (HOSPITAL_COMMUNITY)
Admission: RE | Admit: 2012-08-18 | Discharge: 2012-08-18 | Disposition: A | Discharge status: Home / Self Care | Payer: Self-pay | Source: Ambulatory Visit | Attending: Interventional Cardiology | Admitting: Interventional Cardiology

## 2012-08-18 ENCOUNTER — Encounter (HOSPITAL_COMMUNITY): Payer: Self-pay

## 2012-08-21 ENCOUNTER — Encounter (HOSPITAL_COMMUNITY): Payer: Self-pay

## 2012-08-21 ENCOUNTER — Encounter (HOSPITAL_COMMUNITY)
Admission: RE | Admit: 2012-08-21 | Discharge: 2012-08-21 | Disposition: A | Discharge status: Home / Self Care | Payer: Self-pay | Source: Ambulatory Visit | Attending: Interventional Cardiology | Admitting: Interventional Cardiology

## 2012-08-23 ENCOUNTER — Encounter (HOSPITAL_COMMUNITY)
Admission: RE | Admit: 2012-08-23 | Discharge: 2012-08-23 | Disposition: A | Discharge status: Home / Self Care | Payer: Self-pay | Source: Ambulatory Visit | Attending: Interventional Cardiology | Admitting: Interventional Cardiology

## 2012-08-23 DIAGNOSIS — Z9889 Other specified postprocedural states: Secondary | ICD-10-CM | POA: Insufficient documentation

## 2012-08-23 DIAGNOSIS — Z5189 Encounter for other specified aftercare: Secondary | ICD-10-CM | POA: Insufficient documentation

## 2012-08-23 DIAGNOSIS — I059 Rheumatic mitral valve disease, unspecified: Secondary | ICD-10-CM | POA: Insufficient documentation

## 2012-08-23 DIAGNOSIS — D696 Thrombocytopenia, unspecified: Secondary | ICD-10-CM | POA: Insufficient documentation

## 2012-08-23 DIAGNOSIS — E785 Hyperlipidemia, unspecified: Secondary | ICD-10-CM | POA: Insufficient documentation

## 2012-08-28 ENCOUNTER — Encounter (HOSPITAL_COMMUNITY)
Admission: RE | Admit: 2012-08-28 | Discharge: 2012-08-28 | Disposition: A | Discharge status: Home / Self Care | Payer: Self-pay | Source: Ambulatory Visit | Attending: Interventional Cardiology | Admitting: Interventional Cardiology

## 2012-08-30 ENCOUNTER — Encounter (HOSPITAL_COMMUNITY)
Admission: RE | Admit: 2012-08-30 | Discharge: 2012-08-30 | Disposition: A | Discharge status: Home / Self Care | Payer: Self-pay | Source: Ambulatory Visit | Attending: Interventional Cardiology | Admitting: Interventional Cardiology

## 2012-09-01 ENCOUNTER — Encounter (HOSPITAL_COMMUNITY)
Admission: RE | Admit: 2012-09-01 | Discharge: 2012-09-01 | Disposition: A | Discharge status: Home / Self Care | Payer: Self-pay | Source: Ambulatory Visit | Attending: Interventional Cardiology | Admitting: Interventional Cardiology

## 2012-09-04 ENCOUNTER — Encounter (HOSPITAL_COMMUNITY)
Admission: RE | Admit: 2012-09-04 | Discharge: 2012-09-04 | Disposition: A | Discharge status: Home / Self Care | Payer: Self-pay | Source: Ambulatory Visit | Attending: Interventional Cardiology | Admitting: Interventional Cardiology

## 2012-09-06 ENCOUNTER — Encounter (HOSPITAL_COMMUNITY)
Admission: RE | Admit: 2012-09-06 | Discharge: 2012-09-06 | Disposition: A | Discharge status: Home / Self Care | Payer: Self-pay | Source: Ambulatory Visit | Attending: Interventional Cardiology | Admitting: Interventional Cardiology

## 2012-09-08 ENCOUNTER — Encounter (HOSPITAL_COMMUNITY)
Admission: RE | Admit: 2012-09-08 | Discharge: 2012-09-08 | Disposition: A | Discharge status: Home / Self Care | Payer: Self-pay | Source: Ambulatory Visit | Attending: Interventional Cardiology | Admitting: Interventional Cardiology

## 2012-09-11 ENCOUNTER — Encounter (HOSPITAL_COMMUNITY)
Admission: RE | Admit: 2012-09-11 | Discharge: 2012-09-11 | Disposition: A | Discharge status: Home / Self Care | Payer: Self-pay | Source: Ambulatory Visit | Attending: Interventional Cardiology | Admitting: Interventional Cardiology

## 2012-09-13 ENCOUNTER — Encounter (HOSPITAL_COMMUNITY)
Admission: RE | Admit: 2012-09-13 | Discharge: 2012-09-13 | Disposition: A | Discharge status: Home / Self Care | Payer: Self-pay | Source: Ambulatory Visit | Attending: Interventional Cardiology | Admitting: Interventional Cardiology

## 2012-09-15 ENCOUNTER — Encounter (HOSPITAL_COMMUNITY)
Admission: RE | Admit: 2012-09-15 | Discharge: 2012-09-15 | Disposition: A | Discharge status: Home / Self Care | Payer: Self-pay | Source: Ambulatory Visit | Attending: Interventional Cardiology | Admitting: Interventional Cardiology

## 2012-09-18 ENCOUNTER — Encounter (HOSPITAL_COMMUNITY)
Admission: RE | Admit: 2012-09-18 | Discharge: 2012-09-18 | Disposition: A | Discharge status: Home / Self Care | Payer: Self-pay | Source: Ambulatory Visit | Attending: Interventional Cardiology | Admitting: Interventional Cardiology

## 2012-09-20 ENCOUNTER — Encounter (HOSPITAL_COMMUNITY)
Admission: RE | Admit: 2012-09-20 | Discharge: 2012-09-20 | Disposition: A | Discharge status: Home / Self Care | Payer: Self-pay | Source: Ambulatory Visit | Attending: Interventional Cardiology | Admitting: Interventional Cardiology

## 2012-09-22 ENCOUNTER — Encounter (HOSPITAL_COMMUNITY)
Admission: RE | Admit: 2012-09-22 | Discharge: 2012-09-22 | Disposition: A | Discharge status: Home / Self Care | Payer: Self-pay | Source: Ambulatory Visit | Attending: Interventional Cardiology | Admitting: Interventional Cardiology

## 2012-09-22 DIAGNOSIS — Z9889 Other specified postprocedural states: Secondary | ICD-10-CM | POA: Insufficient documentation

## 2012-09-22 DIAGNOSIS — I059 Rheumatic mitral valve disease, unspecified: Secondary | ICD-10-CM | POA: Insufficient documentation

## 2012-09-22 DIAGNOSIS — D696 Thrombocytopenia, unspecified: Secondary | ICD-10-CM | POA: Insufficient documentation

## 2012-09-22 DIAGNOSIS — Z5189 Encounter for other specified aftercare: Secondary | ICD-10-CM | POA: Insufficient documentation

## 2012-09-22 DIAGNOSIS — E785 Hyperlipidemia, unspecified: Secondary | ICD-10-CM | POA: Insufficient documentation

## 2012-09-25 ENCOUNTER — Encounter (HOSPITAL_COMMUNITY)
Admission: RE | Admit: 2012-09-25 | Discharge: 2012-09-25 | Disposition: A | Discharge status: Home / Self Care | Payer: Self-pay | Source: Ambulatory Visit | Attending: Interventional Cardiology | Admitting: Interventional Cardiology

## 2012-09-27 ENCOUNTER — Encounter (HOSPITAL_COMMUNITY)
Admission: RE | Admit: 2012-09-27 | Discharge: 2012-09-27 | Disposition: A | Discharge status: Home / Self Care | Payer: Self-pay | Source: Ambulatory Visit | Attending: Interventional Cardiology | Admitting: Interventional Cardiology

## 2012-09-29 ENCOUNTER — Encounter (HOSPITAL_COMMUNITY)
Admission: RE | Admit: 2012-09-29 | Discharge: 2012-09-29 | Disposition: A | Discharge status: Home / Self Care | Payer: Self-pay | Source: Ambulatory Visit | Attending: Interventional Cardiology | Admitting: Interventional Cardiology

## 2012-10-02 ENCOUNTER — Encounter (HOSPITAL_COMMUNITY)
Admission: RE | Admit: 2012-10-02 | Discharge: 2012-10-02 | Disposition: A | Discharge status: Home / Self Care | Payer: Self-pay | Source: Ambulatory Visit | Attending: Interventional Cardiology | Admitting: Interventional Cardiology

## 2012-10-04 ENCOUNTER — Encounter (HOSPITAL_COMMUNITY)
Admission: RE | Admit: 2012-10-04 | Discharge: 2012-10-04 | Disposition: A | Discharge status: Home / Self Care | Payer: Self-pay | Source: Ambulatory Visit | Attending: Interventional Cardiology | Admitting: Interventional Cardiology

## 2012-10-06 ENCOUNTER — Encounter (HOSPITAL_COMMUNITY)
Admission: RE | Admit: 2012-10-06 | Discharge: 2012-10-06 | Disposition: A | Discharge status: Home / Self Care | Payer: Self-pay | Source: Ambulatory Visit | Attending: Interventional Cardiology | Admitting: Interventional Cardiology

## 2012-10-09 ENCOUNTER — Encounter (HOSPITAL_COMMUNITY)
Admission: RE | Admit: 2012-10-09 | Discharge: 2012-10-09 | Disposition: A | Discharge status: Home / Self Care | Payer: Self-pay | Source: Ambulatory Visit | Attending: Interventional Cardiology | Admitting: Interventional Cardiology

## 2012-10-11 ENCOUNTER — Encounter (HOSPITAL_COMMUNITY)
Admission: RE | Admit: 2012-10-11 | Discharge: 2012-10-11 | Disposition: A | Discharge status: Home / Self Care | Payer: Self-pay | Source: Ambulatory Visit | Attending: Interventional Cardiology | Admitting: Interventional Cardiology

## 2012-10-13 ENCOUNTER — Encounter (HOSPITAL_COMMUNITY)
Admission: RE | Admit: 2012-10-13 | Discharge: 2012-10-13 | Disposition: A | Discharge status: Home / Self Care | Payer: Self-pay | Source: Ambulatory Visit | Attending: Interventional Cardiology | Admitting: Interventional Cardiology

## 2012-10-16 ENCOUNTER — Encounter (HOSPITAL_COMMUNITY)
Admission: RE | Admit: 2012-10-16 | Discharge: 2012-10-16 | Disposition: A | Discharge status: Home / Self Care | Payer: Self-pay | Source: Ambulatory Visit | Attending: Interventional Cardiology | Admitting: Interventional Cardiology

## 2012-10-18 ENCOUNTER — Encounter (HOSPITAL_COMMUNITY)
Admission: RE | Admit: 2012-10-18 | Discharge: 2012-10-18 | Disposition: A | Discharge status: Home / Self Care | Payer: Self-pay | Source: Ambulatory Visit | Attending: Interventional Cardiology | Admitting: Interventional Cardiology

## 2012-10-20 ENCOUNTER — Encounter (HOSPITAL_COMMUNITY)
Admission: RE | Admit: 2012-10-20 | Discharge: 2012-10-20 | Disposition: A | Discharge status: Home / Self Care | Payer: Self-pay | Source: Ambulatory Visit | Attending: Interventional Cardiology | Admitting: Interventional Cardiology

## 2012-10-25 ENCOUNTER — Encounter (HOSPITAL_COMMUNITY)
Admission: RE | Admit: 2012-10-25 | Discharge: 2012-10-25 | Disposition: A | Discharge status: Home / Self Care | Payer: Self-pay | Source: Ambulatory Visit | Attending: Interventional Cardiology | Admitting: Interventional Cardiology

## 2012-10-25 DIAGNOSIS — I059 Rheumatic mitral valve disease, unspecified: Secondary | ICD-10-CM | POA: Insufficient documentation

## 2012-10-25 DIAGNOSIS — E785 Hyperlipidemia, unspecified: Secondary | ICD-10-CM | POA: Insufficient documentation

## 2012-10-25 DIAGNOSIS — Z5189 Encounter for other specified aftercare: Secondary | ICD-10-CM | POA: Insufficient documentation

## 2012-10-25 DIAGNOSIS — D696 Thrombocytopenia, unspecified: Secondary | ICD-10-CM | POA: Insufficient documentation

## 2012-10-25 DIAGNOSIS — Z9889 Other specified postprocedural states: Secondary | ICD-10-CM | POA: Insufficient documentation

## 2012-10-27 ENCOUNTER — Encounter (HOSPITAL_COMMUNITY)
Admission: RE | Admit: 2012-10-27 | Discharge: 2012-10-27 | Disposition: A | Discharge status: Home / Self Care | Payer: Self-pay | Source: Ambulatory Visit | Attending: Interventional Cardiology | Admitting: Interventional Cardiology

## 2012-10-30 ENCOUNTER — Encounter (HOSPITAL_COMMUNITY)
Admission: RE | Admit: 2012-10-30 | Discharge: 2012-10-30 | Disposition: A | Discharge status: Home / Self Care | Payer: Self-pay | Source: Ambulatory Visit | Attending: Interventional Cardiology | Admitting: Interventional Cardiology

## 2012-11-01 ENCOUNTER — Encounter (HOSPITAL_COMMUNITY)
Admission: RE | Admit: 2012-11-01 | Discharge: 2012-11-01 | Disposition: A | Discharge status: Home / Self Care | Payer: Self-pay | Source: Ambulatory Visit | Attending: Interventional Cardiology | Admitting: Interventional Cardiology

## 2012-11-03 ENCOUNTER — Encounter (HOSPITAL_COMMUNITY)
Admission: RE | Admit: 2012-11-03 | Discharge: 2012-11-03 | Disposition: A | Discharge status: Home / Self Care | Payer: Self-pay | Source: Ambulatory Visit | Attending: Interventional Cardiology | Admitting: Interventional Cardiology

## 2012-11-06 ENCOUNTER — Encounter (HOSPITAL_COMMUNITY)
Admission: RE | Admit: 2012-11-06 | Discharge: 2012-11-06 | Disposition: A | Discharge status: Home / Self Care | Payer: Self-pay | Source: Ambulatory Visit | Attending: Interventional Cardiology | Admitting: Interventional Cardiology

## 2012-11-08 ENCOUNTER — Encounter (HOSPITAL_COMMUNITY)
Admission: RE | Admit: 2012-11-08 | Discharge: 2012-11-08 | Disposition: A | Discharge status: Home / Self Care | Payer: Self-pay | Source: Ambulatory Visit | Attending: Interventional Cardiology | Admitting: Interventional Cardiology

## 2012-11-10 ENCOUNTER — Encounter (HOSPITAL_COMMUNITY)
Admission: RE | Admit: 2012-11-10 | Discharge: 2012-11-10 | Disposition: A | Discharge status: Home / Self Care | Payer: Self-pay | Source: Ambulatory Visit | Attending: Interventional Cardiology | Admitting: Interventional Cardiology

## 2012-11-13 ENCOUNTER — Encounter (HOSPITAL_COMMUNITY)
Admission: RE | Admit: 2012-11-13 | Discharge: 2012-11-13 | Disposition: A | Discharge status: Home / Self Care | Payer: Self-pay | Source: Ambulatory Visit | Attending: Interventional Cardiology | Admitting: Interventional Cardiology

## 2012-11-15 ENCOUNTER — Encounter (HOSPITAL_COMMUNITY)
Admission: RE | Admit: 2012-11-15 | Discharge: 2012-11-15 | Disposition: A | Discharge status: Home / Self Care | Payer: Self-pay | Source: Ambulatory Visit | Attending: Interventional Cardiology | Admitting: Interventional Cardiology

## 2012-11-17 ENCOUNTER — Encounter (HOSPITAL_COMMUNITY): Payer: BC Managed Care – PPO

## 2012-11-20 ENCOUNTER — Encounter (HOSPITAL_COMMUNITY)
Admission: RE | Admit: 2012-11-20 | Discharge: 2012-11-20 | Disposition: A | Discharge status: Home / Self Care | Payer: Self-pay | Source: Ambulatory Visit | Attending: Interventional Cardiology | Admitting: Interventional Cardiology

## 2012-11-22 ENCOUNTER — Encounter (HOSPITAL_COMMUNITY)
Admission: RE | Admit: 2012-11-22 | Discharge: 2012-11-22 | Disposition: A | Discharge status: Home / Self Care | Payer: Self-pay | Source: Ambulatory Visit | Attending: Interventional Cardiology | Admitting: Interventional Cardiology

## 2012-11-22 DIAGNOSIS — I059 Rheumatic mitral valve disease, unspecified: Secondary | ICD-10-CM | POA: Insufficient documentation

## 2012-11-22 DIAGNOSIS — Z9889 Other specified postprocedural states: Secondary | ICD-10-CM | POA: Insufficient documentation

## 2012-11-22 DIAGNOSIS — Z5189 Encounter for other specified aftercare: Secondary | ICD-10-CM | POA: Insufficient documentation

## 2012-11-22 DIAGNOSIS — E785 Hyperlipidemia, unspecified: Secondary | ICD-10-CM | POA: Insufficient documentation

## 2012-11-22 DIAGNOSIS — D696 Thrombocytopenia, unspecified: Secondary | ICD-10-CM | POA: Insufficient documentation

## 2012-11-24 ENCOUNTER — Encounter (HOSPITAL_COMMUNITY)
Admission: RE | Admit: 2012-11-24 | Discharge: 2012-11-24 | Disposition: A | Discharge status: Home / Self Care | Payer: Self-pay | Source: Ambulatory Visit | Attending: Interventional Cardiology | Admitting: Interventional Cardiology

## 2012-11-27 ENCOUNTER — Encounter (HOSPITAL_COMMUNITY)
Admission: RE | Admit: 2012-11-27 | Discharge: 2012-11-27 | Disposition: A | Discharge status: Home / Self Care | Payer: Self-pay | Source: Ambulatory Visit | Attending: Interventional Cardiology | Admitting: Interventional Cardiology

## 2012-11-29 ENCOUNTER — Encounter (HOSPITAL_COMMUNITY)
Admission: RE | Admit: 2012-11-29 | Discharge: 2012-11-29 | Disposition: A | Discharge status: Home / Self Care | Payer: Self-pay | Source: Ambulatory Visit | Attending: Interventional Cardiology | Admitting: Interventional Cardiology

## 2012-12-01 ENCOUNTER — Encounter (HOSPITAL_COMMUNITY)
Admission: RE | Admit: 2012-12-01 | Discharge: 2012-12-01 | Disposition: A | Discharge status: Home / Self Care | Payer: BC Managed Care – PPO | Source: Ambulatory Visit | Attending: Interventional Cardiology | Admitting: Interventional Cardiology

## 2012-12-04 ENCOUNTER — Encounter (HOSPITAL_COMMUNITY)
Admission: RE | Admit: 2012-12-04 | Discharge: 2012-12-04 | Disposition: A | Discharge status: Home / Self Care | Payer: Self-pay | Source: Ambulatory Visit | Attending: Interventional Cardiology | Admitting: Interventional Cardiology

## 2012-12-06 ENCOUNTER — Encounter (HOSPITAL_COMMUNITY)
Admission: RE | Admit: 2012-12-06 | Discharge: 2012-12-06 | Disposition: A | Discharge status: Home / Self Care | Payer: Self-pay | Source: Ambulatory Visit | Attending: Interventional Cardiology | Admitting: Interventional Cardiology

## 2012-12-08 ENCOUNTER — Encounter (HOSPITAL_COMMUNITY)
Admission: RE | Admit: 2012-12-08 | Discharge: 2012-12-08 | Disposition: A | Discharge status: Home / Self Care | Payer: Self-pay | Source: Ambulatory Visit | Attending: Interventional Cardiology | Admitting: Interventional Cardiology

## 2012-12-11 ENCOUNTER — Encounter (HOSPITAL_COMMUNITY): Payer: BC Managed Care – PPO

## 2012-12-13 ENCOUNTER — Encounter (HOSPITAL_COMMUNITY)
Admission: RE | Admit: 2012-12-13 | Discharge: 2012-12-13 | Disposition: A | Discharge status: Home / Self Care | Payer: Self-pay | Source: Ambulatory Visit | Attending: Interventional Cardiology | Admitting: Interventional Cardiology

## 2012-12-15 ENCOUNTER — Encounter (HOSPITAL_COMMUNITY)
Admission: RE | Admit: 2012-12-15 | Discharge: 2012-12-15 | Disposition: A | Discharge status: Home / Self Care | Payer: Self-pay | Source: Ambulatory Visit | Attending: Interventional Cardiology | Admitting: Interventional Cardiology

## 2012-12-18 ENCOUNTER — Encounter (HOSPITAL_COMMUNITY)
Admission: RE | Admit: 2012-12-18 | Discharge: 2012-12-18 | Disposition: A | Discharge status: Home / Self Care | Payer: Self-pay | Source: Ambulatory Visit | Attending: Interventional Cardiology | Admitting: Interventional Cardiology

## 2012-12-20 ENCOUNTER — Encounter (HOSPITAL_COMMUNITY)
Admission: RE | Admit: 2012-12-20 | Discharge: 2012-12-20 | Disposition: A | Discharge status: Home / Self Care | Payer: Self-pay | Source: Ambulatory Visit | Attending: Interventional Cardiology | Admitting: Interventional Cardiology

## 2012-12-22 ENCOUNTER — Encounter (HOSPITAL_COMMUNITY)
Admission: RE | Admit: 2012-12-22 | Discharge: 2012-12-22 | Disposition: A | Discharge status: Home / Self Care | Payer: Self-pay | Source: Ambulatory Visit | Attending: Interventional Cardiology | Admitting: Interventional Cardiology

## 2012-12-25 ENCOUNTER — Encounter (HOSPITAL_COMMUNITY)
Admission: RE | Admit: 2012-12-25 | Discharge: 2012-12-25 | Disposition: A | Discharge status: Home / Self Care | Payer: Self-pay | Source: Ambulatory Visit | Attending: Interventional Cardiology | Admitting: Interventional Cardiology

## 2012-12-25 DIAGNOSIS — E785 Hyperlipidemia, unspecified: Secondary | ICD-10-CM | POA: Insufficient documentation

## 2012-12-25 DIAGNOSIS — I059 Rheumatic mitral valve disease, unspecified: Secondary | ICD-10-CM | POA: Insufficient documentation

## 2012-12-25 DIAGNOSIS — Z5189 Encounter for other specified aftercare: Secondary | ICD-10-CM | POA: Insufficient documentation

## 2012-12-25 DIAGNOSIS — D696 Thrombocytopenia, unspecified: Secondary | ICD-10-CM | POA: Insufficient documentation

## 2012-12-25 DIAGNOSIS — Z9889 Other specified postprocedural states: Secondary | ICD-10-CM | POA: Insufficient documentation

## 2012-12-27 ENCOUNTER — Encounter (HOSPITAL_COMMUNITY)
Admission: RE | Admit: 2012-12-27 | Discharge: 2012-12-27 | Disposition: A | Discharge status: Home / Self Care | Payer: Self-pay | Source: Ambulatory Visit | Attending: Interventional Cardiology | Admitting: Interventional Cardiology

## 2012-12-28 ENCOUNTER — Encounter: Payer: Self-pay | Admitting: Interventional Cardiology

## 2012-12-29 ENCOUNTER — Encounter (HOSPITAL_COMMUNITY)
Admission: RE | Admit: 2012-12-29 | Discharge: 2012-12-29 | Disposition: A | Discharge status: Home / Self Care | Payer: Self-pay | Source: Ambulatory Visit | Attending: Interventional Cardiology | Admitting: Interventional Cardiology

## 2013-01-01 ENCOUNTER — Encounter (HOSPITAL_COMMUNITY)
Admission: RE | Admit: 2013-01-01 | Discharge: 2013-01-01 | Disposition: A | Discharge status: Home / Self Care | Payer: Self-pay | Source: Ambulatory Visit | Attending: Interventional Cardiology | Admitting: Interventional Cardiology

## 2013-01-03 ENCOUNTER — Encounter (HOSPITAL_COMMUNITY)
Admission: RE | Admit: 2013-01-03 | Discharge: 2013-01-03 | Disposition: A | Discharge status: Home / Self Care | Payer: Self-pay | Source: Ambulatory Visit | Attending: Interventional Cardiology | Admitting: Interventional Cardiology

## 2013-01-05 ENCOUNTER — Encounter (HOSPITAL_COMMUNITY)
Admission: RE | Admit: 2013-01-05 | Discharge: 2013-01-05 | Disposition: A | Discharge status: Home / Self Care | Payer: Self-pay | Source: Ambulatory Visit | Attending: Interventional Cardiology | Admitting: Interventional Cardiology

## 2013-01-08 ENCOUNTER — Encounter (HOSPITAL_COMMUNITY)
Admission: RE | Admit: 2013-01-08 | Discharge: 2013-01-08 | Disposition: A | Discharge status: Home / Self Care | Payer: Self-pay | Source: Ambulatory Visit | Attending: Interventional Cardiology | Admitting: Interventional Cardiology

## 2013-01-10 ENCOUNTER — Encounter (HOSPITAL_COMMUNITY)
Admission: RE | Admit: 2013-01-10 | Discharge: 2013-01-10 | Disposition: A | Discharge status: Home / Self Care | Payer: Self-pay | Source: Ambulatory Visit | Attending: Interventional Cardiology | Admitting: Interventional Cardiology

## 2013-01-12 ENCOUNTER — Encounter (HOSPITAL_COMMUNITY)
Admission: RE | Admit: 2013-01-12 | Discharge: 2013-01-12 | Disposition: A | Discharge status: Home / Self Care | Payer: Self-pay | Source: Ambulatory Visit | Attending: Interventional Cardiology | Admitting: Interventional Cardiology

## 2013-01-15 ENCOUNTER — Encounter (HOSPITAL_COMMUNITY)
Admission: RE | Admit: 2013-01-15 | Discharge: 2013-01-15 | Disposition: A | Discharge status: Home / Self Care | Payer: Self-pay | Source: Ambulatory Visit | Attending: Interventional Cardiology | Admitting: Interventional Cardiology

## 2013-01-17 ENCOUNTER — Encounter (HOSPITAL_COMMUNITY)
Admission: RE | Admit: 2013-01-17 | Discharge: 2013-01-17 | Disposition: A | Discharge status: Home / Self Care | Payer: Self-pay | Source: Ambulatory Visit | Attending: Interventional Cardiology | Admitting: Interventional Cardiology

## 2013-01-22 ENCOUNTER — Encounter (HOSPITAL_COMMUNITY)
Admission: RE | Admit: 2013-01-22 | Discharge: 2013-01-22 | Disposition: A | Discharge status: Home / Self Care | Payer: Self-pay | Source: Ambulatory Visit | Attending: Interventional Cardiology | Admitting: Interventional Cardiology

## 2013-01-22 DIAGNOSIS — Z5189 Encounter for other specified aftercare: Secondary | ICD-10-CM | POA: Insufficient documentation

## 2013-01-22 DIAGNOSIS — E785 Hyperlipidemia, unspecified: Secondary | ICD-10-CM | POA: Insufficient documentation

## 2013-01-22 DIAGNOSIS — I059 Rheumatic mitral valve disease, unspecified: Secondary | ICD-10-CM | POA: Insufficient documentation

## 2013-01-22 DIAGNOSIS — Z9889 Other specified postprocedural states: Secondary | ICD-10-CM | POA: Insufficient documentation

## 2013-01-22 DIAGNOSIS — D696 Thrombocytopenia, unspecified: Secondary | ICD-10-CM | POA: Insufficient documentation

## 2013-01-24 ENCOUNTER — Encounter (HOSPITAL_COMMUNITY)
Admission: RE | Admit: 2013-01-24 | Discharge: 2013-01-24 | Disposition: A | Discharge status: Home / Self Care | Payer: Self-pay | Source: Ambulatory Visit | Attending: Interventional Cardiology | Admitting: Interventional Cardiology

## 2013-01-26 ENCOUNTER — Encounter (HOSPITAL_COMMUNITY)
Admission: RE | Admit: 2013-01-26 | Discharge: 2013-01-26 | Disposition: A | Discharge status: Home / Self Care | Payer: Self-pay | Source: Ambulatory Visit | Attending: Interventional Cardiology | Admitting: Interventional Cardiology

## 2013-01-29 ENCOUNTER — Ambulatory Visit (HOSPITAL_COMMUNITY)
Admission: RE | Admit: 2013-01-29 | Discharge: 2013-01-29 | Disposition: A | Discharge status: Home / Self Care | Payer: BC Managed Care – PPO | Source: Ambulatory Visit | Attending: Interventional Cardiology | Admitting: Interventional Cardiology

## 2013-01-29 ENCOUNTER — Other Ambulatory Visit: Payer: Self-pay

## 2013-01-29 ENCOUNTER — Encounter (HOSPITAL_COMMUNITY)
Admission: RE | Admit: 2013-01-29 | Discharge: 2013-01-29 | Disposition: A | Discharge status: Home / Self Care | Payer: Self-pay | Source: Ambulatory Visit | Attending: Interventional Cardiology | Admitting: Interventional Cardiology

## 2013-01-29 DIAGNOSIS — R9431 Abnormal electrocardiogram [ECG] [EKG]: Secondary | ICD-10-CM | POA: Insufficient documentation

## 2013-01-29 NOTE — Progress Notes (Signed)
Prior to beginning exercise today, patient noted that his resting HR was elevated. Pt was placed on the Zoll monitor and heart rate was in the low 120s. Pt was asymptomatic, but states that he felt like his HR was high and also felt the same way last night. A 12-lead EKG was obtained. Pt was evaluated by one of the nurse case managers and was encouraged to hydrate, patient given Gatorade and water. Dr. Michaelle Copas office was contacted, EKG strips were faxed over, and an appointment was made to see Dr. Katrinka Blazing on Thursday 02/01/13. Patient's heart rate came down to the mid 90s, and he participated in exercise without symptoms. Will fax exercise flow sheets to Dr. Michaelle Copas office prior to appointment on Thursday.

## 2013-01-31 ENCOUNTER — Encounter (HOSPITAL_COMMUNITY)
Admission: RE | Admit: 2013-01-31 | Discharge: 2013-01-31 | Disposition: A | Discharge status: Home / Self Care | Payer: Self-pay | Source: Ambulatory Visit | Attending: Interventional Cardiology | Admitting: Interventional Cardiology

## 2013-02-01 ENCOUNTER — Encounter: Payer: Self-pay | Admitting: Interventional Cardiology

## 2013-02-01 ENCOUNTER — Ambulatory Visit (INDEPENDENT_AMBULATORY_CARE_PROVIDER_SITE_OTHER): Payer: BC Managed Care – PPO | Admitting: Interventional Cardiology

## 2013-02-01 VITALS — BP 132/72 | HR 88 | Ht 68.0 in | Wt 177.0 lb

## 2013-02-01 DIAGNOSIS — I2510 Atherosclerotic heart disease of native coronary artery without angina pectoris: Secondary | ICD-10-CM

## 2013-02-01 DIAGNOSIS — Z9889 Other specified postprocedural states: Secondary | ICD-10-CM | POA: Insufficient documentation

## 2013-02-01 DIAGNOSIS — I10 Essential (primary) hypertension: Secondary | ICD-10-CM | POA: Insufficient documentation

## 2013-02-01 DIAGNOSIS — E785 Hyperlipidemia, unspecified: Secondary | ICD-10-CM | POA: Insufficient documentation

## 2013-02-01 HISTORY — DX: Atherosclerotic heart disease of native coronary artery without angina pectoris: I25.10

## 2013-02-01 NOTE — Progress Notes (Signed)
Patient ID: Jerry Tanner, male   DOB: [birth year], 62 y.o.   MRN: [id number] Medical History: Mitral valve repair for MVP and severe MR, 02/2009, Hyperlipidemia, Allergic rhinitis, Depression, Anxiety, CAD, < 50% LAD on cath 2011, itching - sees Dr. Nicholas Lose - etiology unclear - on mirtazapine .    [address], [address] [place], [place]  [address] Phone: [phone] Fax:  [phone]  Date:  02/01/2013   ID:  Jerry Tanner, DOB 04-03-1950, MRN [id number]  PCP:  No primary provider on file.   ASSESSMENT:  1.  Status post mitral valve repair for severe MR, 2011 2. Hyperlipidemia 3. CAD, nonobstructive.  PLAN:  1. Maintain an active lifestyle.   SUBJECTIVE: Jerry Tanner is a 62 y.o. male denies chest discomfort, dyspnea, reduction in exertional tolerance, palpitations, and syncope. He's had no lower extremity edema orthopnea. He denies sustained palpitations. He continues in phase 3 cardiac rehabilitation.   Wt Readings from Last 3 Encounters:  02/01/13 177 lb (80.287 kg)     No past medical history on file.  Current Outpatient Prescriptions  Medication Sig Dispense Refill  . buPROPion (WELLBUTRIN SR) 100 MG 12 hr tablet       . citalopram (CELEXA) 40 MG tablet       . metoprolol succinate (TOPROL-XL) 25 MG 24 hr tablet       . simvastatin (ZOCOR) 20 MG tablet        No current facility-administered medications for this visit.    Allergies:   No Known Allergies  Social History:  The patient  reports that he has never smoked. He does not have any smokeless tobacco history on file. He reports that he does not drink alcohol or use illicit drugs.   ROS:  Please see the history of present illness.      All other systems reviewed and negative.   OBJECTIVE: VS:  BP 132/72  Pulse 88  Ht 5\' 8"  (1.727 m)  Wt 177 lb (80.287 kg)  BMI 26.92 kg/m2 Well nourished, well developed, in no acute distress, healthy HEENT: normal Neck: JVD flat. Carotid bruit 2+  Cardiac:  normal S1, S2;  RRR; no murmur Lungs:  clear to auscultation bilaterally, no wheezing, rhonchi or rales Abd: soft, nontender, no hepatomegaly Ext: Edema absent. Pulses 2+ Skin: warm and dry Neuro:  CNs 2-12 intact, no focal abnormalities noted  EKG:  Left axis deviation/inferior infarct. Normal sinus rhythm.       Signed, Darci Needle III, MD 02/01/2013 2:31 PM

## 2013-02-01 NOTE — Patient Instructions (Signed)
Your physician recommends that you continue on your current medications as directed. Please refer to the Current Medication list given to you today.  Your physician wants you to follow-up in: 1 year You will receive a reminder letter in the mail two months in advance. If you don't receive a letter, please call our office to schedule the follow-up appointment.   Your physician discussed the importance of regular exercise and recommended that you start or continue a regular exercise program for good health.   

## 2013-02-02 ENCOUNTER — Encounter (HOSPITAL_COMMUNITY)
Admission: RE | Admit: 2013-02-02 | Discharge: 2013-02-02 | Disposition: A | Discharge status: Home / Self Care | Payer: Self-pay | Source: Ambulatory Visit | Attending: Interventional Cardiology | Admitting: Interventional Cardiology

## 2013-02-05 ENCOUNTER — Encounter (HOSPITAL_COMMUNITY)
Admission: RE | Admit: 2013-02-05 | Discharge: 2013-02-05 | Disposition: A | Discharge status: Home / Self Care | Payer: Self-pay | Source: Ambulatory Visit | Attending: Interventional Cardiology | Admitting: Interventional Cardiology

## 2013-02-07 ENCOUNTER — Telehealth: Payer: Self-pay

## 2013-02-07 ENCOUNTER — Encounter (HOSPITAL_COMMUNITY)
Admission: RE | Admit: 2013-02-07 | Discharge: 2013-02-07 | Disposition: A | Discharge status: Home / Self Care | Payer: Self-pay | Source: Ambulatory Visit | Attending: Interventional Cardiology | Admitting: Interventional Cardiology

## 2013-02-07 NOTE — Telephone Encounter (Signed)
[facility]iac [facility]hab [facility]ed to [facility] [phone]

## 2013-02-07 NOTE — Telephone Encounter (Signed)
Cardiac rehab form faxed to [facility] [phone] 

## 2013-02-09 ENCOUNTER — Encounter (HOSPITAL_COMMUNITY)
Admission: RE | Admit: 2013-02-09 | Discharge: 2013-02-09 | Disposition: A | Discharge status: Home / Self Care | Payer: Self-pay | Source: Ambulatory Visit | Attending: Interventional Cardiology | Admitting: Interventional Cardiology

## 2013-02-12 ENCOUNTER — Encounter (HOSPITAL_COMMUNITY)
Admission: RE | Admit: 2013-02-12 | Discharge: 2013-02-12 | Disposition: A | Discharge status: Home / Self Care | Payer: Self-pay | Source: Ambulatory Visit | Attending: Interventional Cardiology | Admitting: Interventional Cardiology

## 2013-02-14 ENCOUNTER — Encounter (HOSPITAL_COMMUNITY)
Admission: RE | Admit: 2013-02-14 | Discharge: 2013-02-14 | Disposition: A | Discharge status: Home / Self Care | Payer: Self-pay | Source: Ambulatory Visit | Attending: Interventional Cardiology | Admitting: Interventional Cardiology

## 2013-02-19 ENCOUNTER — Encounter (HOSPITAL_COMMUNITY)
Admission: RE | Admit: 2013-02-19 | Discharge: 2013-02-19 | Disposition: A | Discharge status: Home / Self Care | Payer: Self-pay | Source: Ambulatory Visit | Attending: Interventional Cardiology | Admitting: Interventional Cardiology

## 2013-02-21 ENCOUNTER — Encounter (HOSPITAL_COMMUNITY)
Admission: RE | Admit: 2013-02-21 | Discharge: 2013-02-21 | Disposition: A | Discharge status: Home / Self Care | Payer: BC Managed Care – PPO | Source: Ambulatory Visit | Attending: Interventional Cardiology | Admitting: Interventional Cardiology

## 2013-02-23 ENCOUNTER — Encounter (HOSPITAL_COMMUNITY)
Admission: RE | Admit: 2013-02-23 | Discharge: 2013-02-23 | Disposition: A | Discharge status: Home / Self Care | Payer: Self-pay | Source: Ambulatory Visit | Attending: Interventional Cardiology | Admitting: Interventional Cardiology

## 2013-02-23 DIAGNOSIS — Z5189 Encounter for other specified aftercare: Secondary | ICD-10-CM | POA: Insufficient documentation

## 2013-02-23 DIAGNOSIS — D696 Thrombocytopenia, unspecified: Secondary | ICD-10-CM | POA: Insufficient documentation

## 2013-02-23 DIAGNOSIS — I059 Rheumatic mitral valve disease, unspecified: Secondary | ICD-10-CM | POA: Insufficient documentation

## 2013-02-23 DIAGNOSIS — Z9889 Other specified postprocedural states: Secondary | ICD-10-CM | POA: Insufficient documentation

## 2013-02-23 DIAGNOSIS — E785 Hyperlipidemia, unspecified: Secondary | ICD-10-CM | POA: Insufficient documentation

## 2013-02-26 ENCOUNTER — Encounter (HOSPITAL_COMMUNITY)
Admission: RE | Admit: 2013-02-26 | Discharge: 2013-02-26 | Disposition: A | Discharge status: Home / Self Care | Payer: Self-pay | Source: Ambulatory Visit | Attending: Interventional Cardiology | Admitting: Interventional Cardiology

## 2013-02-28 ENCOUNTER — Encounter (HOSPITAL_COMMUNITY)
Admission: RE | Admit: 2013-02-28 | Discharge: 2013-02-28 | Disposition: A | Discharge status: Home / Self Care | Payer: Self-pay | Source: Ambulatory Visit | Attending: Interventional Cardiology | Admitting: Interventional Cardiology

## 2013-02-28 ENCOUNTER — Other Ambulatory Visit: Payer: Self-pay | Admitting: Interventional Cardiology

## 2013-03-02 ENCOUNTER — Encounter (HOSPITAL_COMMUNITY): Payer: BC Managed Care – PPO

## 2013-03-05 ENCOUNTER — Encounter (HOSPITAL_COMMUNITY): Payer: BC Managed Care – PPO

## 2013-03-07 ENCOUNTER — Encounter (HOSPITAL_COMMUNITY)
Admission: RE | Admit: 2013-03-07 | Discharge: 2013-03-07 | Disposition: A | Discharge status: Home / Self Care | Payer: Self-pay | Source: Ambulatory Visit | Attending: Interventional Cardiology | Admitting: Interventional Cardiology

## 2013-03-09 ENCOUNTER — Encounter (HOSPITAL_COMMUNITY)
Admission: RE | Admit: 2013-03-09 | Discharge: 2013-03-09 | Disposition: A | Discharge status: Home / Self Care | Payer: Self-pay | Source: Ambulatory Visit | Attending: Interventional Cardiology | Admitting: Interventional Cardiology

## 2013-03-12 ENCOUNTER — Encounter (HOSPITAL_COMMUNITY)
Admission: RE | Admit: 2013-03-12 | Discharge: 2013-03-12 | Disposition: A | Discharge status: Home / Self Care | Payer: Self-pay | Source: Ambulatory Visit | Attending: Interventional Cardiology | Admitting: Interventional Cardiology

## 2013-03-14 ENCOUNTER — Encounter (HOSPITAL_COMMUNITY)
Admission: RE | Admit: 2013-03-14 | Discharge: 2013-03-14 | Disposition: A | Discharge status: Home / Self Care | Payer: Self-pay | Source: Ambulatory Visit | Attending: Interventional Cardiology | Admitting: Interventional Cardiology

## 2013-03-16 ENCOUNTER — Encounter (HOSPITAL_COMMUNITY)
Admission: RE | Admit: 2013-03-16 | Discharge: 2013-03-16 | Disposition: A | Discharge status: Home / Self Care | Payer: Self-pay | Source: Ambulatory Visit | Attending: Interventional Cardiology | Admitting: Interventional Cardiology

## 2013-03-19 ENCOUNTER — Ambulatory Visit: Payer: BC Managed Care – PPO | Admitting: Interventional Cardiology

## 2013-03-19 ENCOUNTER — Encounter (HOSPITAL_COMMUNITY)
Admission: RE | Admit: 2013-03-19 | Discharge: 2013-03-19 | Disposition: A | Discharge status: Home / Self Care | Payer: Self-pay | Source: Ambulatory Visit | Attending: Interventional Cardiology | Admitting: Interventional Cardiology

## 2013-03-21 ENCOUNTER — Encounter (HOSPITAL_COMMUNITY)
Admission: RE | Admit: 2013-03-21 | Discharge: 2013-03-21 | Disposition: A | Discharge status: Home / Self Care | Payer: BC Managed Care – PPO | Source: Ambulatory Visit | Attending: Interventional Cardiology | Admitting: Interventional Cardiology

## 2013-03-23 ENCOUNTER — Encounter (HOSPITAL_COMMUNITY)
Admission: RE | Admit: 2013-03-23 | Discharge: 2013-03-23 | Disposition: A | Discharge status: Home / Self Care | Payer: Self-pay | Source: Ambulatory Visit | Attending: Interventional Cardiology | Admitting: Interventional Cardiology

## 2013-03-26 ENCOUNTER — Encounter (HOSPITAL_COMMUNITY)
Admission: RE | Admit: 2013-03-26 | Discharge: 2013-03-26 | Disposition: A | Discharge status: Home / Self Care | Payer: Self-pay | Source: Ambulatory Visit | Attending: Interventional Cardiology | Admitting: Interventional Cardiology

## 2013-03-26 DIAGNOSIS — Z9889 Other specified postprocedural states: Secondary | ICD-10-CM | POA: Insufficient documentation

## 2013-03-26 DIAGNOSIS — Z5189 Encounter for other specified aftercare: Secondary | ICD-10-CM | POA: Insufficient documentation

## 2013-03-26 DIAGNOSIS — I059 Rheumatic mitral valve disease, unspecified: Secondary | ICD-10-CM | POA: Insufficient documentation

## 2013-03-26 DIAGNOSIS — D696 Thrombocytopenia, unspecified: Secondary | ICD-10-CM | POA: Insufficient documentation

## 2013-03-26 DIAGNOSIS — E785 Hyperlipidemia, unspecified: Secondary | ICD-10-CM | POA: Insufficient documentation

## 2013-03-28 ENCOUNTER — Encounter (HOSPITAL_COMMUNITY)
Admission: RE | Admit: 2013-03-28 | Discharge: 2013-03-28 | Disposition: A | Discharge status: Home / Self Care | Payer: Self-pay | Source: Ambulatory Visit | Attending: Interventional Cardiology | Admitting: Interventional Cardiology

## 2013-03-30 ENCOUNTER — Encounter (HOSPITAL_COMMUNITY)
Admission: RE | Admit: 2013-03-30 | Discharge: 2013-03-30 | Disposition: A | Discharge status: Home / Self Care | Payer: Self-pay | Source: Ambulatory Visit | Attending: Interventional Cardiology | Admitting: Interventional Cardiology

## 2013-04-02 ENCOUNTER — Encounter (HOSPITAL_COMMUNITY)
Admission: RE | Admit: 2013-04-02 | Discharge: 2013-04-02 | Disposition: A | Discharge status: Home / Self Care | Payer: Self-pay | Source: Ambulatory Visit | Attending: Interventional Cardiology | Admitting: Interventional Cardiology

## 2013-04-04 ENCOUNTER — Encounter (HOSPITAL_COMMUNITY)
Admission: RE | Admit: 2013-04-04 | Discharge: 2013-04-04 | Disposition: A | Discharge status: Home / Self Care | Payer: Self-pay | Source: Ambulatory Visit | Attending: Interventional Cardiology | Admitting: Interventional Cardiology

## 2013-04-05 ENCOUNTER — Encounter: Payer: Self-pay | Admitting: Interventional Cardiology

## 2013-04-06 ENCOUNTER — Encounter (HOSPITAL_COMMUNITY)
Admission: RE | Admit: 2013-04-06 | Discharge: 2013-04-06 | Disposition: A | Discharge status: Home / Self Care | Payer: BC Managed Care – PPO | Source: Ambulatory Visit | Attending: Interventional Cardiology | Admitting: Interventional Cardiology

## 2013-04-09 ENCOUNTER — Encounter (HOSPITAL_COMMUNITY)
Admission: RE | Admit: 2013-04-09 | Discharge: 2013-04-09 | Disposition: A | Discharge status: Home / Self Care | Payer: Self-pay | Source: Ambulatory Visit | Attending: Interventional Cardiology | Admitting: Interventional Cardiology

## 2013-04-11 ENCOUNTER — Encounter (HOSPITAL_COMMUNITY)
Admission: RE | Admit: 2013-04-11 | Discharge: 2013-04-11 | Disposition: A | Discharge status: Home / Self Care | Payer: Self-pay | Source: Ambulatory Visit | Attending: Interventional Cardiology | Admitting: Interventional Cardiology

## 2013-04-13 ENCOUNTER — Encounter (HOSPITAL_COMMUNITY)
Admission: RE | Admit: 2013-04-13 | Discharge: 2013-04-13 | Disposition: A | Discharge status: Home / Self Care | Payer: Self-pay | Source: Ambulatory Visit | Attending: Interventional Cardiology | Admitting: Interventional Cardiology

## 2013-04-16 ENCOUNTER — Encounter (HOSPITAL_COMMUNITY)
Admission: RE | Admit: 2013-04-16 | Discharge: 2013-04-16 | Disposition: A | Discharge status: Home / Self Care | Payer: Self-pay | Source: Ambulatory Visit | Attending: Interventional Cardiology | Admitting: Interventional Cardiology

## 2013-04-18 ENCOUNTER — Encounter (HOSPITAL_COMMUNITY)
Admission: RE | Admit: 2013-04-18 | Discharge: 2013-04-18 | Disposition: A | Discharge status: Home / Self Care | Payer: Self-pay | Source: Ambulatory Visit | Attending: Interventional Cardiology | Admitting: Interventional Cardiology

## 2013-04-20 ENCOUNTER — Encounter (HOSPITAL_COMMUNITY)
Admission: RE | Admit: 2013-04-20 | Discharge: 2013-04-20 | Disposition: A | Discharge status: Home / Self Care | Payer: BC Managed Care – PPO | Source: Ambulatory Visit | Attending: Interventional Cardiology | Admitting: Interventional Cardiology

## 2013-04-23 ENCOUNTER — Encounter (HOSPITAL_COMMUNITY)
Admission: RE | Admit: 2013-04-23 | Discharge: 2013-04-23 | Disposition: A | Discharge status: Home / Self Care | Payer: Self-pay | Source: Ambulatory Visit | Attending: Interventional Cardiology | Admitting: Interventional Cardiology

## 2013-04-23 DIAGNOSIS — I059 Rheumatic mitral valve disease, unspecified: Secondary | ICD-10-CM | POA: Insufficient documentation

## 2013-04-23 DIAGNOSIS — Z9889 Other specified postprocedural states: Secondary | ICD-10-CM | POA: Insufficient documentation

## 2013-04-23 DIAGNOSIS — Z5189 Encounter for other specified aftercare: Secondary | ICD-10-CM | POA: Insufficient documentation

## 2013-04-23 DIAGNOSIS — E785 Hyperlipidemia, unspecified: Secondary | ICD-10-CM | POA: Insufficient documentation

## 2013-04-23 DIAGNOSIS — D696 Thrombocytopenia, unspecified: Secondary | ICD-10-CM | POA: Insufficient documentation

## 2013-04-25 ENCOUNTER — Encounter (HOSPITAL_COMMUNITY)
Admission: RE | Admit: 2013-04-25 | Discharge: 2013-04-25 | Disposition: A | Discharge status: Home / Self Care | Payer: Self-pay | Source: Ambulatory Visit | Attending: Interventional Cardiology | Admitting: Interventional Cardiology

## 2013-04-27 ENCOUNTER — Encounter (HOSPITAL_COMMUNITY): Payer: BC Managed Care – PPO

## 2013-04-30 ENCOUNTER — Encounter (HOSPITAL_COMMUNITY)
Admission: RE | Admit: 2013-04-30 | Discharge: 2013-04-30 | Disposition: A | Discharge status: Home / Self Care | Payer: BC Managed Care – PPO | Source: Ambulatory Visit | Attending: Interventional Cardiology | Admitting: Interventional Cardiology

## 2013-05-02 ENCOUNTER — Encounter (HOSPITAL_COMMUNITY)
Admission: RE | Admit: 2013-05-02 | Discharge: 2013-05-02 | Disposition: A | Discharge status: Home / Self Care | Payer: BC Managed Care – PPO | Source: Ambulatory Visit | Attending: Interventional Cardiology | Admitting: Interventional Cardiology

## 2013-05-04 ENCOUNTER — Encounter (HOSPITAL_COMMUNITY)
Admission: RE | Admit: 2013-05-04 | Discharge: 2013-05-04 | Disposition: A | Discharge status: Home / Self Care | Payer: Self-pay | Source: Ambulatory Visit | Attending: Interventional Cardiology | Admitting: Interventional Cardiology

## 2013-05-07 ENCOUNTER — Encounter (HOSPITAL_COMMUNITY)
Admission: RE | Admit: 2013-05-07 | Discharge: 2013-05-07 | Disposition: A | Discharge status: Home / Self Care | Payer: Self-pay | Source: Ambulatory Visit | Attending: Interventional Cardiology | Admitting: Interventional Cardiology

## 2013-05-09 ENCOUNTER — Encounter (HOSPITAL_COMMUNITY)
Admission: RE | Admit: 2013-05-09 | Discharge: 2013-05-09 | Disposition: A | Discharge status: Home / Self Care | Payer: Self-pay | Source: Ambulatory Visit | Attending: Interventional Cardiology | Admitting: Interventional Cardiology

## 2013-05-11 ENCOUNTER — Encounter (HOSPITAL_COMMUNITY)
Admission: RE | Admit: 2013-05-11 | Discharge: 2013-05-11 | Disposition: A | Discharge status: Home / Self Care | Payer: Self-pay | Source: Ambulatory Visit | Attending: Interventional Cardiology | Admitting: Interventional Cardiology

## 2013-05-14 ENCOUNTER — Encounter (HOSPITAL_COMMUNITY)
Admission: RE | Admit: 2013-05-14 | Discharge: 2013-05-14 | Disposition: A | Discharge status: Home / Self Care | Payer: Self-pay | Source: Ambulatory Visit | Attending: Interventional Cardiology | Admitting: Interventional Cardiology

## 2013-05-16 ENCOUNTER — Encounter (HOSPITAL_COMMUNITY)
Admission: RE | Admit: 2013-05-16 | Discharge: 2013-05-16 | Disposition: A | Discharge status: Home / Self Care | Payer: Self-pay | Source: Ambulatory Visit | Attending: Interventional Cardiology | Admitting: Interventional Cardiology

## 2013-05-18 ENCOUNTER — Encounter (HOSPITAL_COMMUNITY)
Admission: RE | Admit: 2013-05-18 | Discharge: 2013-05-18 | Disposition: A | Discharge status: Home / Self Care | Payer: Self-pay | Source: Ambulatory Visit | Attending: Interventional Cardiology | Admitting: Interventional Cardiology

## 2013-05-21 ENCOUNTER — Encounter (HOSPITAL_COMMUNITY)
Admission: RE | Admit: 2013-05-21 | Discharge: 2013-05-21 | Disposition: A | Discharge status: Home / Self Care | Payer: Self-pay | Source: Ambulatory Visit | Attending: Interventional Cardiology | Admitting: Interventional Cardiology

## 2013-05-23 ENCOUNTER — Encounter (HOSPITAL_COMMUNITY)
Admission: RE | Admit: 2013-05-23 | Discharge: 2013-05-23 | Disposition: A | Discharge status: Home / Self Care | Payer: Self-pay | Source: Ambulatory Visit | Attending: Interventional Cardiology | Admitting: Interventional Cardiology

## 2013-05-23 DIAGNOSIS — I059 Rheumatic mitral valve disease, unspecified: Secondary | ICD-10-CM | POA: Insufficient documentation

## 2013-05-23 DIAGNOSIS — Z9889 Other specified postprocedural states: Secondary | ICD-10-CM | POA: Insufficient documentation

## 2013-05-23 DIAGNOSIS — D696 Thrombocytopenia, unspecified: Secondary | ICD-10-CM | POA: Insufficient documentation

## 2013-05-23 DIAGNOSIS — E785 Hyperlipidemia, unspecified: Secondary | ICD-10-CM | POA: Insufficient documentation

## 2013-05-23 DIAGNOSIS — Z5189 Encounter for other specified aftercare: Secondary | ICD-10-CM | POA: Insufficient documentation

## 2013-05-25 ENCOUNTER — Encounter (HOSPITAL_COMMUNITY)
Admission: RE | Admit: 2013-05-25 | Discharge: 2013-05-25 | Disposition: A | Discharge status: Home / Self Care | Payer: Self-pay | Source: Ambulatory Visit | Attending: Interventional Cardiology | Admitting: Interventional Cardiology

## 2013-05-28 ENCOUNTER — Encounter (HOSPITAL_COMMUNITY)
Admission: RE | Admit: 2013-05-28 | Discharge: 2013-05-28 | Disposition: A | Discharge status: Home / Self Care | Payer: Self-pay | Source: Ambulatory Visit | Attending: Interventional Cardiology | Admitting: Interventional Cardiology

## 2013-05-30 ENCOUNTER — Encounter (HOSPITAL_COMMUNITY)
Admission: RE | Admit: 2013-05-30 | Discharge: 2013-05-30 | Disposition: A | Discharge status: Home / Self Care | Payer: Self-pay | Source: Ambulatory Visit | Attending: Interventional Cardiology | Admitting: Interventional Cardiology

## 2013-06-01 ENCOUNTER — Encounter (HOSPITAL_COMMUNITY)
Admission: RE | Admit: 2013-06-01 | Discharge: 2013-06-01 | Disposition: A | Discharge status: Home / Self Care | Payer: Self-pay | Source: Ambulatory Visit | Attending: Interventional Cardiology | Admitting: Interventional Cardiology

## 2013-06-04 ENCOUNTER — Encounter (HOSPITAL_COMMUNITY)
Admission: RE | Admit: 2013-06-04 | Discharge: 2013-06-04 | Disposition: A | Discharge status: Home / Self Care | Payer: Self-pay | Source: Ambulatory Visit | Attending: Interventional Cardiology | Admitting: Interventional Cardiology

## 2013-06-06 ENCOUNTER — Encounter (HOSPITAL_COMMUNITY)
Admission: RE | Admit: 2013-06-06 | Discharge: 2013-06-06 | Disposition: A | Discharge status: Home / Self Care | Payer: Self-pay | Source: Ambulatory Visit | Attending: Interventional Cardiology | Admitting: Interventional Cardiology

## 2013-06-08 ENCOUNTER — Encounter (HOSPITAL_COMMUNITY)
Admission: RE | Admit: 2013-06-08 | Discharge: 2013-06-08 | Disposition: A | Discharge status: Home / Self Care | Payer: Self-pay | Source: Ambulatory Visit | Attending: Interventional Cardiology | Admitting: Interventional Cardiology

## 2013-06-11 ENCOUNTER — Encounter (HOSPITAL_COMMUNITY)
Admission: RE | Admit: 2013-06-11 | Discharge: 2013-06-11 | Disposition: A | Discharge status: Home / Self Care | Payer: Self-pay | Source: Ambulatory Visit | Attending: Interventional Cardiology | Admitting: Interventional Cardiology

## 2013-06-13 ENCOUNTER — Encounter (HOSPITAL_COMMUNITY)
Admission: RE | Admit: 2013-06-13 | Discharge: 2013-06-13 | Disposition: A | Discharge status: Home / Self Care | Payer: Self-pay | Source: Ambulatory Visit | Attending: Interventional Cardiology | Admitting: Interventional Cardiology

## 2013-06-15 ENCOUNTER — Encounter (HOSPITAL_COMMUNITY)
Admission: RE | Admit: 2013-06-15 | Discharge: 2013-06-15 | Disposition: A | Discharge status: Home / Self Care | Payer: Self-pay | Source: Ambulatory Visit | Attending: Interventional Cardiology | Admitting: Interventional Cardiology

## 2013-06-18 ENCOUNTER — Encounter (HOSPITAL_COMMUNITY)
Admission: RE | Admit: 2013-06-18 | Discharge: 2013-06-18 | Disposition: A | Discharge status: Home / Self Care | Payer: Self-pay | Source: Ambulatory Visit | Attending: Interventional Cardiology | Admitting: Interventional Cardiology

## 2013-06-20 ENCOUNTER — Encounter (HOSPITAL_COMMUNITY)
Admission: RE | Admit: 2013-06-20 | Discharge: 2013-06-20 | Disposition: A | Discharge status: Home / Self Care | Payer: Self-pay | Source: Ambulatory Visit | Attending: Interventional Cardiology | Admitting: Interventional Cardiology

## 2013-06-22 ENCOUNTER — Encounter (HOSPITAL_COMMUNITY)
Admission: RE | Admit: 2013-06-22 | Discharge: 2013-06-22 | Disposition: A | Discharge status: Home / Self Care | Payer: Self-pay | Source: Ambulatory Visit | Attending: Interventional Cardiology | Admitting: Interventional Cardiology

## 2013-06-22 DIAGNOSIS — I059 Rheumatic mitral valve disease, unspecified: Secondary | ICD-10-CM | POA: Insufficient documentation

## 2013-06-22 DIAGNOSIS — D696 Thrombocytopenia, unspecified: Secondary | ICD-10-CM | POA: Insufficient documentation

## 2013-06-22 DIAGNOSIS — Z5189 Encounter for other specified aftercare: Secondary | ICD-10-CM | POA: Insufficient documentation

## 2013-06-22 DIAGNOSIS — Z9889 Other specified postprocedural states: Secondary | ICD-10-CM | POA: Insufficient documentation

## 2013-06-22 DIAGNOSIS — E785 Hyperlipidemia, unspecified: Secondary | ICD-10-CM | POA: Insufficient documentation

## 2013-06-25 ENCOUNTER — Encounter (HOSPITAL_COMMUNITY)
Admission: RE | Admit: 2013-06-25 | Discharge: 2013-06-25 | Disposition: A | Discharge status: Home / Self Care | Payer: Self-pay | Source: Ambulatory Visit | Attending: Interventional Cardiology | Admitting: Interventional Cardiology

## 2013-06-27 ENCOUNTER — Encounter (HOSPITAL_COMMUNITY)
Admission: RE | Admit: 2013-06-27 | Discharge: 2013-06-27 | Disposition: A | Discharge status: Home / Self Care | Payer: Self-pay | Source: Ambulatory Visit | Attending: Interventional Cardiology | Admitting: Interventional Cardiology

## 2013-06-29 ENCOUNTER — Encounter (HOSPITAL_COMMUNITY)
Admission: RE | Admit: 2013-06-29 | Discharge: 2013-06-29 | Disposition: A | Discharge status: Home / Self Care | Payer: Self-pay | Source: Ambulatory Visit | Attending: Interventional Cardiology | Admitting: Interventional Cardiology

## 2013-07-02 ENCOUNTER — Encounter (HOSPITAL_COMMUNITY)
Admission: RE | Admit: 2013-07-02 | Discharge: 2013-07-02 | Disposition: A | Discharge status: Home / Self Care | Payer: Self-pay | Source: Ambulatory Visit | Attending: Interventional Cardiology | Admitting: Interventional Cardiology

## 2013-07-04 ENCOUNTER — Encounter (HOSPITAL_COMMUNITY)
Admission: RE | Admit: 2013-07-04 | Discharge: 2013-07-04 | Disposition: A | Discharge status: Home / Self Care | Payer: Self-pay | Source: Ambulatory Visit | Attending: Interventional Cardiology | Admitting: Interventional Cardiology

## 2013-07-06 ENCOUNTER — Encounter (HOSPITAL_COMMUNITY)
Admission: RE | Admit: 2013-07-06 | Discharge: 2013-07-06 | Disposition: A | Discharge status: Home / Self Care | Payer: Self-pay | Source: Ambulatory Visit | Attending: Interventional Cardiology | Admitting: Interventional Cardiology

## 2013-07-09 ENCOUNTER — Encounter (HOSPITAL_COMMUNITY)
Admission: RE | Admit: 2013-07-09 | Discharge: 2013-07-09 | Disposition: A | Discharge status: Home / Self Care | Payer: Self-pay | Source: Ambulatory Visit | Attending: Interventional Cardiology | Admitting: Interventional Cardiology

## 2013-07-11 ENCOUNTER — Encounter (HOSPITAL_COMMUNITY)
Admission: RE | Admit: 2013-07-11 | Discharge: 2013-07-11 | Disposition: A | Discharge status: Home / Self Care | Payer: Self-pay | Source: Ambulatory Visit | Attending: Interventional Cardiology | Admitting: Interventional Cardiology

## 2013-07-13 ENCOUNTER — Encounter (HOSPITAL_COMMUNITY)
Admission: RE | Admit: 2013-07-13 | Discharge: 2013-07-13 | Disposition: A | Discharge status: Home / Self Care | Payer: Self-pay | Source: Ambulatory Visit | Attending: Interventional Cardiology | Admitting: Interventional Cardiology

## 2013-07-18 ENCOUNTER — Encounter (HOSPITAL_COMMUNITY)
Admission: RE | Admit: 2013-07-18 | Discharge: 2013-07-18 | Disposition: A | Discharge status: Home / Self Care | Payer: Self-pay | Source: Ambulatory Visit | Attending: Interventional Cardiology | Admitting: Interventional Cardiology

## 2013-07-20 ENCOUNTER — Encounter (HOSPITAL_COMMUNITY)
Admission: RE | Admit: 2013-07-20 | Discharge: 2013-07-20 | Disposition: A | Discharge status: Home / Self Care | Payer: Self-pay | Source: Ambulatory Visit | Attending: Interventional Cardiology | Admitting: Interventional Cardiology

## 2013-07-23 ENCOUNTER — Encounter (HOSPITAL_COMMUNITY)
Admission: RE | Admit: 2013-07-23 | Discharge: 2013-07-23 | Disposition: A | Discharge status: Home / Self Care | Payer: Self-pay | Source: Ambulatory Visit | Attending: Interventional Cardiology | Admitting: Interventional Cardiology

## 2013-07-23 DIAGNOSIS — Z9889 Other specified postprocedural states: Secondary | ICD-10-CM | POA: Insufficient documentation

## 2013-07-23 DIAGNOSIS — I059 Rheumatic mitral valve disease, unspecified: Secondary | ICD-10-CM | POA: Insufficient documentation

## 2013-07-23 DIAGNOSIS — D696 Thrombocytopenia, unspecified: Secondary | ICD-10-CM | POA: Insufficient documentation

## 2013-07-23 DIAGNOSIS — Z5189 Encounter for other specified aftercare: Secondary | ICD-10-CM | POA: Insufficient documentation

## 2013-07-23 DIAGNOSIS — E785 Hyperlipidemia, unspecified: Secondary | ICD-10-CM | POA: Insufficient documentation

## 2013-07-25 ENCOUNTER — Encounter (HOSPITAL_COMMUNITY): Payer: BC Managed Care – PPO

## 2013-07-27 ENCOUNTER — Encounter (HOSPITAL_COMMUNITY)
Admission: RE | Admit: 2013-07-27 | Discharge: 2013-07-27 | Disposition: A | Discharge status: Home / Self Care | Payer: Self-pay | Source: Ambulatory Visit | Attending: Interventional Cardiology | Admitting: Interventional Cardiology

## 2013-07-30 ENCOUNTER — Encounter (HOSPITAL_COMMUNITY)
Admission: RE | Admit: 2013-07-30 | Discharge: 2013-07-30 | Disposition: A | Discharge status: Home / Self Care | Payer: Self-pay | Source: Ambulatory Visit | Attending: Interventional Cardiology | Admitting: Interventional Cardiology

## 2013-08-01 ENCOUNTER — Encounter (HOSPITAL_COMMUNITY)
Admission: RE | Admit: 2013-08-01 | Discharge: 2013-08-01 | Disposition: A | Discharge status: Home / Self Care | Payer: BC Managed Care – PPO | Source: Ambulatory Visit | Attending: Interventional Cardiology | Admitting: Interventional Cardiology

## 2013-08-03 ENCOUNTER — Encounter (HOSPITAL_COMMUNITY)
Admission: RE | Admit: 2013-08-03 | Discharge: 2013-08-03 | Disposition: A | Discharge status: Home / Self Care | Payer: Self-pay | Source: Ambulatory Visit | Attending: Interventional Cardiology | Admitting: Interventional Cardiology

## 2013-08-06 ENCOUNTER — Encounter (HOSPITAL_COMMUNITY)
Admission: RE | Admit: 2013-08-06 | Discharge: 2013-08-06 | Disposition: A | Discharge status: Home / Self Care | Payer: Self-pay | Source: Ambulatory Visit | Attending: Interventional Cardiology | Admitting: Interventional Cardiology

## 2013-08-08 ENCOUNTER — Encounter (HOSPITAL_COMMUNITY)
Admission: RE | Admit: 2013-08-08 | Discharge: 2013-08-08 | Disposition: A | Discharge status: Home / Self Care | Payer: Self-pay | Source: Ambulatory Visit | Attending: Interventional Cardiology | Admitting: Interventional Cardiology

## 2013-08-10 ENCOUNTER — Encounter (HOSPITAL_COMMUNITY)
Admission: RE | Admit: 2013-08-10 | Discharge: 2013-08-10 | Disposition: A | Discharge status: Home / Self Care | Payer: Self-pay | Source: Ambulatory Visit | Attending: Interventional Cardiology | Admitting: Interventional Cardiology

## 2013-08-13 ENCOUNTER — Encounter (HOSPITAL_COMMUNITY)
Admission: RE | Admit: 2013-08-13 | Discharge: 2013-08-13 | Disposition: A | Discharge status: Home / Self Care | Payer: Self-pay | Source: Ambulatory Visit | Attending: Interventional Cardiology | Admitting: Interventional Cardiology

## 2013-08-15 ENCOUNTER — Encounter (HOSPITAL_COMMUNITY)
Admission: RE | Admit: 2013-08-15 | Discharge: 2013-08-15 | Disposition: A | Discharge status: Home / Self Care | Payer: Self-pay | Source: Ambulatory Visit | Attending: Interventional Cardiology | Admitting: Interventional Cardiology

## 2013-08-17 ENCOUNTER — Encounter (HOSPITAL_COMMUNITY)
Admission: RE | Admit: 2013-08-17 | Discharge: 2013-08-17 | Disposition: A | Discharge status: Home / Self Care | Payer: Self-pay | Source: Ambulatory Visit | Attending: Interventional Cardiology | Admitting: Interventional Cardiology

## 2013-08-20 ENCOUNTER — Encounter (HOSPITAL_COMMUNITY)
Admission: RE | Admit: 2013-08-20 | Discharge: 2013-08-20 | Disposition: A | Discharge status: Home / Self Care | Payer: Self-pay | Source: Ambulatory Visit | Attending: Interventional Cardiology | Admitting: Interventional Cardiology

## 2013-08-22 ENCOUNTER — Encounter (HOSPITAL_COMMUNITY)
Admission: RE | Admit: 2013-08-22 | Discharge: 2013-08-22 | Disposition: A | Discharge status: Home / Self Care | Payer: Self-pay | Source: Ambulatory Visit | Attending: Interventional Cardiology | Admitting: Interventional Cardiology

## 2013-08-22 DIAGNOSIS — Z9889 Other specified postprocedural states: Secondary | ICD-10-CM | POA: Insufficient documentation

## 2013-08-22 DIAGNOSIS — D696 Thrombocytopenia, unspecified: Secondary | ICD-10-CM | POA: Insufficient documentation

## 2013-08-22 DIAGNOSIS — Z5189 Encounter for other specified aftercare: Secondary | ICD-10-CM | POA: Insufficient documentation

## 2013-08-22 DIAGNOSIS — E785 Hyperlipidemia, unspecified: Secondary | ICD-10-CM | POA: Insufficient documentation

## 2013-08-22 DIAGNOSIS — I059 Rheumatic mitral valve disease, unspecified: Secondary | ICD-10-CM | POA: Insufficient documentation

## 2013-08-27 ENCOUNTER — Encounter (HOSPITAL_COMMUNITY)
Admission: RE | Admit: 2013-08-27 | Discharge: 2013-08-27 | Disposition: A | Discharge status: Home / Self Care | Payer: Self-pay | Source: Ambulatory Visit | Attending: Interventional Cardiology | Admitting: Interventional Cardiology

## 2013-08-29 ENCOUNTER — Encounter (HOSPITAL_COMMUNITY)
Admission: RE | Admit: 2013-08-29 | Discharge: 2013-08-29 | Disposition: A | Discharge status: Home / Self Care | Payer: Self-pay | Source: Ambulatory Visit | Attending: Interventional Cardiology | Admitting: Interventional Cardiology

## 2013-08-31 ENCOUNTER — Encounter (HOSPITAL_COMMUNITY)
Admission: RE | Admit: 2013-08-31 | Discharge: 2013-08-31 | Disposition: A | Discharge status: Home / Self Care | Payer: Self-pay | Source: Ambulatory Visit | Attending: Interventional Cardiology | Admitting: Interventional Cardiology

## 2013-09-03 ENCOUNTER — Encounter (HOSPITAL_COMMUNITY)
Admission: RE | Admit: 2013-09-03 | Discharge: 2013-09-03 | Disposition: A | Discharge status: Home / Self Care | Payer: Self-pay | Source: Ambulatory Visit | Attending: Interventional Cardiology | Admitting: Interventional Cardiology

## 2013-09-05 ENCOUNTER — Encounter (HOSPITAL_COMMUNITY)
Admission: RE | Admit: 2013-09-05 | Discharge: 2013-09-05 | Disposition: A | Discharge status: Home / Self Care | Payer: Self-pay | Source: Ambulatory Visit | Attending: Interventional Cardiology | Admitting: Interventional Cardiology

## 2013-09-07 ENCOUNTER — Encounter (HOSPITAL_COMMUNITY)
Admission: RE | Admit: 2013-09-07 | Discharge: 2013-09-07 | Disposition: A | Discharge status: Home / Self Care | Payer: Self-pay | Source: Ambulatory Visit | Attending: Interventional Cardiology | Admitting: Interventional Cardiology

## 2013-09-10 ENCOUNTER — Encounter (HOSPITAL_COMMUNITY)
Admission: RE | Admit: 2013-09-10 | Discharge: 2013-09-10 | Disposition: A | Discharge status: Home / Self Care | Payer: Self-pay | Source: Ambulatory Visit | Attending: Interventional Cardiology | Admitting: Interventional Cardiology

## 2013-09-12 ENCOUNTER — Encounter (HOSPITAL_COMMUNITY)
Admission: RE | Admit: 2013-09-12 | Discharge: 2013-09-12 | Disposition: A | Discharge status: Home / Self Care | Payer: Self-pay | Source: Ambulatory Visit | Attending: Interventional Cardiology | Admitting: Interventional Cardiology

## 2013-09-14 ENCOUNTER — Encounter (HOSPITAL_COMMUNITY)
Admission: RE | Admit: 2013-09-14 | Discharge: 2013-09-14 | Disposition: A | Discharge status: Home / Self Care | Payer: Self-pay | Source: Ambulatory Visit | Attending: Interventional Cardiology | Admitting: Interventional Cardiology

## 2013-09-17 ENCOUNTER — Encounter (HOSPITAL_COMMUNITY)
Admission: RE | Admit: 2013-09-17 | Discharge: 2013-09-17 | Disposition: A | Discharge status: Home / Self Care | Payer: Self-pay | Source: Ambulatory Visit | Attending: Interventional Cardiology | Admitting: Interventional Cardiology

## 2013-09-19 ENCOUNTER — Encounter (HOSPITAL_COMMUNITY)
Admission: RE | Admit: 2013-09-19 | Discharge: 2013-09-19 | Disposition: A | Discharge status: Home / Self Care | Payer: Self-pay | Source: Ambulatory Visit | Attending: Interventional Cardiology | Admitting: Interventional Cardiology

## 2013-09-21 ENCOUNTER — Encounter (HOSPITAL_COMMUNITY)
Admission: RE | Admit: 2013-09-21 | Discharge: 2013-09-21 | Disposition: A | Discharge status: Home / Self Care | Payer: Self-pay | Source: Ambulatory Visit | Attending: Interventional Cardiology | Admitting: Interventional Cardiology

## 2013-09-24 ENCOUNTER — Encounter (HOSPITAL_COMMUNITY)
Admission: RE | Admit: 2013-09-24 | Discharge: 2013-09-24 | Disposition: A | Discharge status: Home / Self Care | Payer: Self-pay | Source: Ambulatory Visit | Attending: Interventional Cardiology | Admitting: Interventional Cardiology

## 2013-09-24 DIAGNOSIS — E785 Hyperlipidemia, unspecified: Secondary | ICD-10-CM | POA: Insufficient documentation

## 2013-09-24 DIAGNOSIS — Z9889 Other specified postprocedural states: Secondary | ICD-10-CM | POA: Insufficient documentation

## 2013-09-24 DIAGNOSIS — I059 Rheumatic mitral valve disease, unspecified: Secondary | ICD-10-CM | POA: Insufficient documentation

## 2013-09-24 DIAGNOSIS — D696 Thrombocytopenia, unspecified: Secondary | ICD-10-CM | POA: Insufficient documentation

## 2013-09-24 DIAGNOSIS — Z5189 Encounter for other specified aftercare: Secondary | ICD-10-CM | POA: Insufficient documentation

## 2013-09-26 ENCOUNTER — Encounter (HOSPITAL_COMMUNITY)
Admission: RE | Admit: 2013-09-26 | Discharge: 2013-09-26 | Disposition: A | Discharge status: Home / Self Care | Payer: Self-pay | Source: Ambulatory Visit | Attending: Interventional Cardiology | Admitting: Interventional Cardiology

## 2013-09-28 ENCOUNTER — Encounter (HOSPITAL_COMMUNITY)
Admission: RE | Admit: 2013-09-28 | Discharge: 2013-09-28 | Disposition: A | Discharge status: Home / Self Care | Payer: Self-pay | Source: Ambulatory Visit | Attending: Interventional Cardiology | Admitting: Interventional Cardiology

## 2013-10-01 ENCOUNTER — Encounter (HOSPITAL_COMMUNITY)
Admission: RE | Admit: 2013-10-01 | Discharge: 2013-10-01 | Disposition: A | Discharge status: Home / Self Care | Payer: Self-pay | Source: Ambulatory Visit | Attending: Interventional Cardiology | Admitting: Interventional Cardiology

## 2013-10-03 ENCOUNTER — Encounter (HOSPITAL_COMMUNITY)
Admission: RE | Admit: 2013-10-03 | Discharge: 2013-10-03 | Disposition: A | Discharge status: Home / Self Care | Payer: Self-pay | Source: Ambulatory Visit | Attending: Interventional Cardiology | Admitting: Interventional Cardiology

## 2013-10-05 ENCOUNTER — Encounter (HOSPITAL_COMMUNITY)
Admission: RE | Admit: 2013-10-05 | Discharge: 2013-10-05 | Disposition: A | Discharge status: Home / Self Care | Payer: Self-pay | Source: Ambulatory Visit | Attending: Interventional Cardiology | Admitting: Interventional Cardiology

## 2013-10-08 ENCOUNTER — Encounter (HOSPITAL_COMMUNITY)
Admission: RE | Admit: 2013-10-08 | Discharge: 2013-10-08 | Disposition: A | Discharge status: Home / Self Care | Payer: Self-pay | Source: Ambulatory Visit | Attending: Interventional Cardiology | Admitting: Interventional Cardiology

## 2013-10-10 ENCOUNTER — Encounter (HOSPITAL_COMMUNITY): Payer: Self-pay

## 2013-10-12 ENCOUNTER — Encounter (HOSPITAL_COMMUNITY)
Admission: RE | Admit: 2013-10-12 | Discharge: 2013-10-12 | Disposition: A | Discharge status: Home / Self Care | Payer: Self-pay | Source: Ambulatory Visit | Attending: Interventional Cardiology | Admitting: Interventional Cardiology

## 2013-10-15 ENCOUNTER — Encounter (HOSPITAL_COMMUNITY)
Admission: RE | Admit: 2013-10-15 | Discharge: 2013-10-15 | Disposition: A | Discharge status: Home / Self Care | Payer: BC Managed Care – PPO | Source: Ambulatory Visit | Attending: Interventional Cardiology | Admitting: Interventional Cardiology

## 2013-10-17 ENCOUNTER — Encounter (HOSPITAL_COMMUNITY)
Admission: RE | Admit: 2013-10-17 | Discharge: 2013-10-17 | Disposition: A | Discharge status: Home / Self Care | Payer: Self-pay | Source: Ambulatory Visit | Attending: Interventional Cardiology | Admitting: Interventional Cardiology

## 2013-10-19 ENCOUNTER — Encounter (HOSPITAL_COMMUNITY)
Admission: RE | Admit: 2013-10-19 | Discharge: 2013-10-19 | Disposition: A | Discharge status: Home / Self Care | Payer: Self-pay | Source: Ambulatory Visit | Attending: Interventional Cardiology | Admitting: Interventional Cardiology

## 2013-10-22 ENCOUNTER — Encounter (HOSPITAL_COMMUNITY)
Admission: RE | Admit: 2013-10-22 | Discharge: 2013-10-22 | Disposition: A | Discharge status: Home / Self Care | Payer: Self-pay | Source: Ambulatory Visit | Attending: Interventional Cardiology | Admitting: Interventional Cardiology

## 2013-10-24 ENCOUNTER — Encounter (HOSPITAL_COMMUNITY)
Admission: RE | Admit: 2013-10-24 | Discharge: 2013-10-24 | Disposition: A | Discharge status: Home / Self Care | Payer: Self-pay | Source: Ambulatory Visit | Attending: Interventional Cardiology | Admitting: Interventional Cardiology

## 2013-10-24 DIAGNOSIS — I059 Rheumatic mitral valve disease, unspecified: Secondary | ICD-10-CM | POA: Insufficient documentation

## 2013-10-24 DIAGNOSIS — Z9889 Other specified postprocedural states: Secondary | ICD-10-CM | POA: Insufficient documentation

## 2013-10-24 DIAGNOSIS — E785 Hyperlipidemia, unspecified: Secondary | ICD-10-CM | POA: Insufficient documentation

## 2013-10-24 DIAGNOSIS — Z5189 Encounter for other specified aftercare: Secondary | ICD-10-CM | POA: Insufficient documentation

## 2013-10-24 DIAGNOSIS — D696 Thrombocytopenia, unspecified: Secondary | ICD-10-CM | POA: Insufficient documentation

## 2013-10-26 ENCOUNTER — Encounter (HOSPITAL_COMMUNITY)
Admission: RE | Admit: 2013-10-26 | Discharge: 2013-10-26 | Disposition: A | Discharge status: Home / Self Care | Payer: Self-pay | Source: Ambulatory Visit | Attending: Interventional Cardiology | Admitting: Interventional Cardiology

## 2013-10-31 ENCOUNTER — Encounter (HOSPITAL_COMMUNITY)
Admission: RE | Admit: 2013-10-31 | Discharge: 2013-10-31 | Disposition: A | Discharge status: Home / Self Care | Payer: Self-pay | Source: Ambulatory Visit | Attending: Interventional Cardiology | Admitting: Interventional Cardiology

## 2013-11-02 ENCOUNTER — Encounter (HOSPITAL_COMMUNITY)
Admission: RE | Admit: 2013-11-02 | Discharge: 2013-11-02 | Disposition: A | Discharge status: Home / Self Care | Payer: Self-pay | Source: Ambulatory Visit | Attending: Interventional Cardiology | Admitting: Interventional Cardiology

## 2013-11-05 ENCOUNTER — Encounter (HOSPITAL_COMMUNITY)
Admission: RE | Admit: 2013-11-05 | Discharge: 2013-11-05 | Disposition: A | Discharge status: Home / Self Care | Payer: Self-pay | Source: Ambulatory Visit | Attending: Interventional Cardiology | Admitting: Interventional Cardiology

## 2013-11-07 ENCOUNTER — Encounter (HOSPITAL_COMMUNITY)
Admission: RE | Admit: 2013-11-07 | Discharge: 2013-11-07 | Disposition: A | Discharge status: Home / Self Care | Payer: BC Managed Care – PPO | Source: Ambulatory Visit | Attending: Interventional Cardiology | Admitting: Interventional Cardiology

## 2013-11-09 ENCOUNTER — Encounter (HOSPITAL_COMMUNITY)
Admission: RE | Admit: 2013-11-09 | Discharge: 2013-11-09 | Disposition: A | Discharge status: Home / Self Care | Payer: Self-pay | Source: Ambulatory Visit | Attending: Interventional Cardiology | Admitting: Interventional Cardiology

## 2013-11-12 ENCOUNTER — Encounter (HOSPITAL_COMMUNITY)
Admission: RE | Admit: 2013-11-12 | Discharge: 2013-11-12 | Disposition: A | Discharge status: Home / Self Care | Payer: Self-pay | Source: Ambulatory Visit | Attending: Interventional Cardiology | Admitting: Interventional Cardiology

## 2013-11-14 ENCOUNTER — Encounter (HOSPITAL_COMMUNITY): Payer: Self-pay

## 2013-11-16 ENCOUNTER — Encounter (HOSPITAL_COMMUNITY)
Admission: RE | Admit: 2013-11-16 | Discharge: 2013-11-16 | Disposition: A | Discharge status: Home / Self Care | Payer: Self-pay | Source: Ambulatory Visit | Attending: Interventional Cardiology | Admitting: Interventional Cardiology

## 2013-11-19 ENCOUNTER — Encounter (HOSPITAL_COMMUNITY)
Admission: RE | Admit: 2013-11-19 | Discharge: 2013-11-19 | Disposition: A | Discharge status: Home / Self Care | Payer: Self-pay | Source: Ambulatory Visit | Attending: Interventional Cardiology | Admitting: Interventional Cardiology

## 2013-11-21 ENCOUNTER — Encounter (HOSPITAL_COMMUNITY)
Admission: RE | Admit: 2013-11-21 | Discharge: 2013-11-21 | Disposition: A | Discharge status: Home / Self Care | Payer: Self-pay | Source: Ambulatory Visit | Attending: Interventional Cardiology | Admitting: Interventional Cardiology

## 2013-11-23 ENCOUNTER — Encounter (HOSPITAL_COMMUNITY)
Admission: RE | Admit: 2013-11-23 | Discharge: 2013-11-23 | Disposition: A | Discharge status: Home / Self Care | Payer: Self-pay | Source: Ambulatory Visit | Attending: Interventional Cardiology | Admitting: Interventional Cardiology

## 2013-11-23 DIAGNOSIS — I059 Rheumatic mitral valve disease, unspecified: Secondary | ICD-10-CM | POA: Insufficient documentation

## 2013-11-23 DIAGNOSIS — E785 Hyperlipidemia, unspecified: Secondary | ICD-10-CM | POA: Insufficient documentation

## 2013-11-23 DIAGNOSIS — Z5189 Encounter for other specified aftercare: Secondary | ICD-10-CM | POA: Insufficient documentation

## 2013-11-23 DIAGNOSIS — Z9889 Other specified postprocedural states: Secondary | ICD-10-CM | POA: Insufficient documentation

## 2013-11-23 DIAGNOSIS — D696 Thrombocytopenia, unspecified: Secondary | ICD-10-CM | POA: Insufficient documentation

## 2013-11-26 ENCOUNTER — Encounter (HOSPITAL_COMMUNITY)
Admission: RE | Admit: 2013-11-26 | Discharge: 2013-11-26 | Disposition: A | Discharge status: Home / Self Care | Payer: Self-pay | Source: Ambulatory Visit | Attending: Interventional Cardiology | Admitting: Interventional Cardiology

## 2013-11-28 ENCOUNTER — Encounter (HOSPITAL_COMMUNITY)
Admission: RE | Admit: 2013-11-28 | Discharge: 2013-11-28 | Disposition: A | Discharge status: Home / Self Care | Payer: Self-pay | Source: Ambulatory Visit | Attending: Interventional Cardiology | Admitting: Interventional Cardiology

## 2013-11-30 ENCOUNTER — Encounter (HOSPITAL_COMMUNITY)
Admission: RE | Admit: 2013-11-30 | Discharge: 2013-11-30 | Disposition: A | Discharge status: Home / Self Care | Payer: Self-pay | Source: Ambulatory Visit | Attending: Interventional Cardiology | Admitting: Interventional Cardiology

## 2013-12-03 ENCOUNTER — Encounter (HOSPITAL_COMMUNITY): Payer: Self-pay

## 2013-12-05 ENCOUNTER — Encounter (HOSPITAL_COMMUNITY)
Admission: RE | Admit: 2013-12-05 | Discharge: 2013-12-05 | Disposition: A | Discharge status: Home / Self Care | Payer: Self-pay | Source: Ambulatory Visit | Attending: Interventional Cardiology | Admitting: Interventional Cardiology

## 2013-12-07 ENCOUNTER — Encounter (HOSPITAL_COMMUNITY)
Admission: RE | Admit: 2013-12-07 | Discharge: 2013-12-07 | Disposition: A | Discharge status: Home / Self Care | Payer: Self-pay | Source: Ambulatory Visit | Attending: Interventional Cardiology | Admitting: Interventional Cardiology

## 2013-12-10 ENCOUNTER — Encounter (HOSPITAL_COMMUNITY)
Admission: RE | Admit: 2013-12-10 | Discharge: 2013-12-10 | Disposition: A | Discharge status: Home / Self Care | Payer: Self-pay | Source: Ambulatory Visit | Attending: Interventional Cardiology | Admitting: Interventional Cardiology

## 2013-12-12 ENCOUNTER — Encounter (HOSPITAL_COMMUNITY)
Admission: RE | Admit: 2013-12-12 | Discharge: 2013-12-12 | Disposition: A | Discharge status: Home / Self Care | Payer: Self-pay | Source: Ambulatory Visit | Attending: Interventional Cardiology | Admitting: Interventional Cardiology

## 2013-12-14 ENCOUNTER — Encounter (HOSPITAL_COMMUNITY): Payer: Self-pay

## 2013-12-17 ENCOUNTER — Encounter (HOSPITAL_COMMUNITY)
Admission: RE | Admit: 2013-12-17 | Discharge: 2013-12-17 | Disposition: A | Discharge status: Home / Self Care | Payer: Self-pay | Source: Ambulatory Visit | Attending: Interventional Cardiology | Admitting: Interventional Cardiology

## 2013-12-19 ENCOUNTER — Encounter (HOSPITAL_COMMUNITY)
Admission: RE | Admit: 2013-12-19 | Discharge: 2013-12-19 | Disposition: A | Discharge status: Home / Self Care | Payer: Self-pay | Source: Ambulatory Visit | Attending: Interventional Cardiology | Admitting: Interventional Cardiology

## 2013-12-21 ENCOUNTER — Encounter (HOSPITAL_COMMUNITY)
Admission: RE | Admit: 2013-12-21 | Discharge: 2013-12-21 | Disposition: A | Discharge status: Home / Self Care | Payer: Self-pay | Source: Ambulatory Visit | Attending: Interventional Cardiology | Admitting: Interventional Cardiology

## 2013-12-24 ENCOUNTER — Encounter (HOSPITAL_COMMUNITY)
Admission: RE | Admit: 2013-12-24 | Discharge: 2013-12-24 | Disposition: A | Discharge status: Home / Self Care | Payer: Self-pay | Source: Ambulatory Visit | Attending: Interventional Cardiology | Admitting: Interventional Cardiology

## 2013-12-24 DIAGNOSIS — D696 Thrombocytopenia, unspecified: Secondary | ICD-10-CM | POA: Insufficient documentation

## 2013-12-24 DIAGNOSIS — Z5189 Encounter for other specified aftercare: Secondary | ICD-10-CM | POA: Insufficient documentation

## 2013-12-24 DIAGNOSIS — E785 Hyperlipidemia, unspecified: Secondary | ICD-10-CM | POA: Insufficient documentation

## 2013-12-24 DIAGNOSIS — Z9889 Other specified postprocedural states: Secondary | ICD-10-CM | POA: Insufficient documentation

## 2013-12-24 DIAGNOSIS — I059 Rheumatic mitral valve disease, unspecified: Secondary | ICD-10-CM | POA: Insufficient documentation

## 2013-12-26 ENCOUNTER — Encounter (HOSPITAL_COMMUNITY): Payer: Self-pay

## 2013-12-28 ENCOUNTER — Encounter (HOSPITAL_COMMUNITY)
Admission: RE | Admit: 2013-12-28 | Discharge: 2013-12-28 | Disposition: A | Discharge status: Home / Self Care | Payer: Self-pay | Source: Ambulatory Visit | Attending: Interventional Cardiology | Admitting: Interventional Cardiology

## 2013-12-31 ENCOUNTER — Encounter (HOSPITAL_COMMUNITY)
Admission: RE | Admit: 2013-12-31 | Discharge: 2013-12-31 | Disposition: A | Discharge status: Home / Self Care | Payer: Self-pay | Source: Ambulatory Visit | Attending: Interventional Cardiology | Admitting: Interventional Cardiology

## 2014-01-02 ENCOUNTER — Encounter (HOSPITAL_COMMUNITY)
Admission: RE | Admit: 2014-01-02 | Discharge: 2014-01-02 | Disposition: A | Discharge status: Home / Self Care | Payer: Self-pay | Source: Ambulatory Visit | Attending: Interventional Cardiology | Admitting: Interventional Cardiology

## 2014-01-04 ENCOUNTER — Encounter (HOSPITAL_COMMUNITY)
Admission: RE | Admit: 2014-01-04 | Discharge: 2014-01-04 | Disposition: A | Discharge status: Home / Self Care | Payer: Self-pay | Source: Ambulatory Visit | Attending: Interventional Cardiology | Admitting: Interventional Cardiology

## 2014-01-07 ENCOUNTER — Encounter (HOSPITAL_COMMUNITY)
Admission: RE | Admit: 2014-01-07 | Discharge: 2014-01-07 | Disposition: A | Discharge status: Home / Self Care | Payer: Self-pay | Source: Ambulatory Visit | Attending: Interventional Cardiology | Admitting: Interventional Cardiology

## 2014-01-09 ENCOUNTER — Encounter (HOSPITAL_COMMUNITY)
Admission: RE | Admit: 2014-01-09 | Discharge: 2014-01-09 | Disposition: A | Discharge status: Home / Self Care | Payer: Self-pay | Source: Ambulatory Visit | Attending: Interventional Cardiology | Admitting: Interventional Cardiology

## 2014-01-11 ENCOUNTER — Encounter (HOSPITAL_COMMUNITY): Payer: Self-pay

## 2014-01-14 ENCOUNTER — Encounter (HOSPITAL_COMMUNITY)
Admission: RE | Admit: 2014-01-14 | Discharge: 2014-01-14 | Disposition: A | Discharge status: Home / Self Care | Payer: Self-pay | Source: Ambulatory Visit | Attending: Interventional Cardiology | Admitting: Interventional Cardiology

## 2014-01-16 ENCOUNTER — Encounter (HOSPITAL_COMMUNITY)
Admission: RE | Admit: 2014-01-16 | Discharge: 2014-01-16 | Disposition: A | Discharge status: Home / Self Care | Payer: Self-pay | Source: Ambulatory Visit | Attending: Interventional Cardiology | Admitting: Interventional Cardiology

## 2014-01-21 ENCOUNTER — Encounter (HOSPITAL_COMMUNITY)
Admission: RE | Admit: 2014-01-21 | Discharge: 2014-01-21 | Disposition: A | Discharge status: Home / Self Care | Payer: Self-pay | Source: Ambulatory Visit | Attending: Interventional Cardiology | Admitting: Interventional Cardiology

## 2014-01-23 ENCOUNTER — Encounter (HOSPITAL_COMMUNITY)
Admission: RE | Admit: 2014-01-23 | Discharge: 2014-01-23 | Disposition: A | Discharge status: Home / Self Care | Payer: Self-pay | Source: Ambulatory Visit | Attending: Interventional Cardiology | Admitting: Interventional Cardiology

## 2014-01-23 DIAGNOSIS — Z9889 Other specified postprocedural states: Secondary | ICD-10-CM | POA: Insufficient documentation

## 2014-01-23 DIAGNOSIS — Z5189 Encounter for other specified aftercare: Secondary | ICD-10-CM | POA: Insufficient documentation

## 2014-01-23 DIAGNOSIS — I059 Rheumatic mitral valve disease, unspecified: Secondary | ICD-10-CM | POA: Insufficient documentation

## 2014-01-23 DIAGNOSIS — D696 Thrombocytopenia, unspecified: Secondary | ICD-10-CM | POA: Insufficient documentation

## 2014-01-23 DIAGNOSIS — E785 Hyperlipidemia, unspecified: Secondary | ICD-10-CM | POA: Insufficient documentation

## 2014-01-25 ENCOUNTER — Encounter (HOSPITAL_COMMUNITY)
Admission: RE | Admit: 2014-01-25 | Discharge: 2014-01-25 | Disposition: A | Discharge status: Home / Self Care | Payer: Self-pay | Source: Ambulatory Visit | Attending: Interventional Cardiology | Admitting: Interventional Cardiology

## 2014-01-28 ENCOUNTER — Encounter (HOSPITAL_COMMUNITY)
Admission: RE | Admit: 2014-01-28 | Discharge: 2014-01-28 | Disposition: A | Discharge status: Home / Self Care | Payer: Self-pay | Source: Ambulatory Visit | Attending: Interventional Cardiology | Admitting: Interventional Cardiology

## 2014-01-30 ENCOUNTER — Encounter (HOSPITAL_COMMUNITY)
Admission: RE | Admit: 2014-01-30 | Discharge: 2014-01-30 | Disposition: A | Discharge status: Home / Self Care | Payer: Self-pay | Source: Ambulatory Visit | Attending: Interventional Cardiology | Admitting: Interventional Cardiology

## 2014-02-01 ENCOUNTER — Ambulatory Visit: Payer: BC Managed Care – PPO | Admitting: Interventional Cardiology

## 2014-02-01 ENCOUNTER — Encounter (HOSPITAL_COMMUNITY)
Admission: RE | Admit: 2014-02-01 | Discharge: 2014-02-01 | Disposition: A | Discharge status: Home / Self Care | Payer: Self-pay | Source: Ambulatory Visit | Attending: Interventional Cardiology | Admitting: Interventional Cardiology

## 2014-02-04 ENCOUNTER — Encounter (HOSPITAL_COMMUNITY)
Admission: RE | Admit: 2014-02-04 | Discharge: 2014-02-04 | Disposition: A | Discharge status: Home / Self Care | Payer: Self-pay | Source: Ambulatory Visit | Attending: Interventional Cardiology | Admitting: Interventional Cardiology

## 2014-02-06 ENCOUNTER — Encounter (HOSPITAL_COMMUNITY)
Admission: RE | Admit: 2014-02-06 | Discharge: 2014-02-06 | Disposition: A | Discharge status: Home / Self Care | Payer: Self-pay | Source: Ambulatory Visit | Attending: Interventional Cardiology | Admitting: Interventional Cardiology

## 2014-02-08 ENCOUNTER — Encounter (HOSPITAL_COMMUNITY)
Admission: RE | Admit: 2014-02-08 | Discharge: 2014-02-08 | Disposition: A | Discharge status: Home / Self Care | Payer: Self-pay | Source: Ambulatory Visit | Attending: Interventional Cardiology | Admitting: Interventional Cardiology

## 2014-02-11 ENCOUNTER — Encounter (HOSPITAL_COMMUNITY)
Admission: RE | Admit: 2014-02-11 | Discharge: 2014-02-11 | Disposition: A | Discharge status: Home / Self Care | Payer: Self-pay | Source: Ambulatory Visit | Attending: Interventional Cardiology | Admitting: Interventional Cardiology

## 2014-02-13 ENCOUNTER — Encounter (HOSPITAL_COMMUNITY)
Admission: RE | Admit: 2014-02-13 | Discharge: 2014-02-13 | Disposition: A | Discharge status: Home / Self Care | Payer: Self-pay | Source: Ambulatory Visit | Attending: Interventional Cardiology | Admitting: Interventional Cardiology

## 2014-02-18 ENCOUNTER — Encounter (HOSPITAL_COMMUNITY)
Admission: RE | Admit: 2014-02-18 | Discharge: 2014-02-18 | Disposition: A | Discharge status: Home / Self Care | Payer: Self-pay | Source: Ambulatory Visit | Attending: Interventional Cardiology | Admitting: Interventional Cardiology

## 2014-02-20 ENCOUNTER — Encounter (HOSPITAL_COMMUNITY)
Admission: RE | Admit: 2014-02-20 | Discharge: 2014-02-20 | Disposition: A | Discharge status: Home / Self Care | Payer: Self-pay | Source: Ambulatory Visit | Attending: Interventional Cardiology | Admitting: Interventional Cardiology

## 2014-02-25 ENCOUNTER — Encounter (HOSPITAL_COMMUNITY)
Admission: RE | Admit: 2014-02-25 | Discharge: 2014-02-25 | Disposition: A | Discharge status: Home / Self Care | Payer: Self-pay | Source: Ambulatory Visit | Attending: Interventional Cardiology | Admitting: Interventional Cardiology

## 2014-02-25 DIAGNOSIS — E785 Hyperlipidemia, unspecified: Secondary | ICD-10-CM | POA: Insufficient documentation

## 2014-02-25 DIAGNOSIS — Z9889 Other specified postprocedural states: Secondary | ICD-10-CM | POA: Insufficient documentation

## 2014-02-25 DIAGNOSIS — Z5189 Encounter for other specified aftercare: Secondary | ICD-10-CM | POA: Insufficient documentation

## 2014-02-25 DIAGNOSIS — D696 Thrombocytopenia, unspecified: Secondary | ICD-10-CM | POA: Insufficient documentation

## 2014-02-25 DIAGNOSIS — I059 Rheumatic mitral valve disease, unspecified: Secondary | ICD-10-CM | POA: Insufficient documentation

## 2014-02-27 ENCOUNTER — Encounter (HOSPITAL_COMMUNITY)
Admission: RE | Admit: 2014-02-27 | Discharge: 2014-02-27 | Disposition: A | Discharge status: Home / Self Care | Payer: Self-pay | Source: Ambulatory Visit | Attending: Interventional Cardiology | Admitting: Interventional Cardiology

## 2014-03-01 ENCOUNTER — Encounter (HOSPITAL_COMMUNITY)
Admission: RE | Admit: 2014-03-01 | Discharge: 2014-03-01 | Disposition: A | Discharge status: Home / Self Care | Payer: Self-pay | Source: Ambulatory Visit | Attending: Interventional Cardiology | Admitting: Interventional Cardiology

## 2014-03-04 ENCOUNTER — Encounter (HOSPITAL_COMMUNITY): Payer: Self-pay

## 2014-03-06 ENCOUNTER — Encounter (HOSPITAL_COMMUNITY): Payer: Self-pay

## 2014-03-08 ENCOUNTER — Other Ambulatory Visit: Payer: Self-pay | Admitting: Interventional Cardiology

## 2014-03-08 ENCOUNTER — Encounter (HOSPITAL_COMMUNITY)
Admission: RE | Admit: 2014-03-08 | Discharge: 2014-03-08 | Disposition: A | Discharge status: Home / Self Care | Payer: Self-pay | Source: Ambulatory Visit | Attending: Interventional Cardiology | Admitting: Interventional Cardiology

## 2014-03-13 ENCOUNTER — Encounter (HOSPITAL_COMMUNITY)
Admission: RE | Admit: 2014-03-13 | Discharge: 2014-03-13 | Disposition: A | Discharge status: Home / Self Care | Payer: Self-pay | Source: Ambulatory Visit | Attending: Interventional Cardiology | Admitting: Interventional Cardiology

## 2014-03-13 ENCOUNTER — Encounter: Payer: Self-pay | Admitting: Interventional Cardiology

## 2014-03-14 ENCOUNTER — Encounter: Payer: Self-pay | Admitting: Interventional Cardiology

## 2014-03-14 ENCOUNTER — Ambulatory Visit (INDEPENDENT_AMBULATORY_CARE_PROVIDER_SITE_OTHER): Payer: BLUE CROSS/BLUE SHIELD | Admitting: Interventional Cardiology

## 2014-03-14 VITALS — BP 122/74 | HR 77 | Ht 68.0 in | Wt 166.0 lb

## 2014-03-14 DIAGNOSIS — Z9889 Other specified postprocedural states: Secondary | ICD-10-CM

## 2014-03-14 DIAGNOSIS — I10 Essential (primary) hypertension: Secondary | ICD-10-CM

## 2014-03-14 DIAGNOSIS — E785 Hyperlipidemia, unspecified: Secondary | ICD-10-CM

## 2014-03-14 DIAGNOSIS — I2510 Atherosclerotic heart disease of native coronary artery without angina pectoris: Secondary | ICD-10-CM

## 2014-03-14 NOTE — Patient Instructions (Addendum)
Your physician recommends that you continue on your current medications as directed. Please refer to the Current Medication list given to you today.  Your physician wants you to follow-up in: 1 year. You will receive a reminder letter in the mail two months in advance. If you don't receive a letter, please call our office to schedule the follow-up appointment.  

## 2014-03-14 NOTE — Progress Notes (Signed)
Patient ID: Jerry Tanner, male   DOB: 25-Mar-1950, 64 y.o.   MRN: [id number]    [address] [place], [place]  [address] Phone: [phone] Fax:  [phone]  Date:  03/14/2014   ID:  Jerry Tanner, DOB Aug 14, 1950, MRN [id number]  PCP:  Horton Finer, MD   ASSESSMENT:  1. S/p MVR. No clinical evidence of recurrent MR 2. Coronary artery disease, asymptomatic 3. Hyperlipidemia followed by primary care  PLAN:  1. Clinic follow-up in one year 2. Aerobic activity 3. Mediterranean diet   SUBJECTIVE: Jerry Tanner is a 64 y.o. male who is doing well. He denies dyspnea. No palpitations or significant cardiovascular complaints. He continues in cardiac rehabilitation of his own accord. He is concerned he would not have the ability to exercise on a regular basis without the accountability to others.   Wt Readings from Last 3 Encounters:  03/14/14 166 lb (75.297 kg)  02/01/13 177 lb (80.287 kg)     Past Medical History  Diagnosis Date  . H/O mitral valve repair     Current Outpatient Prescriptions  Medication Sig Dispense Refill  . buPROPion (WELLBUTRIN SR) 100 MG 12 hr tablet     . citalopram (CELEXA) 40 MG tablet     . metoprolol succinate (TOPROL-XL) 25 MG 24 hr tablet TAKE 1 TABLET EACH DAY. 30 tablet 1  . simvastatin (ZOCOR) 20 MG tablet      No current facility-administered medications for this visit.    Allergies:   No Known Allergies  Social History:  The patient  reports that he has never smoked. He does not have any smokeless tobacco history on file. He reports that he does not drink alcohol or use illicit drugs.   ROS:  Please see the history of present illness.   No chills, fever, weight loss, or anorexia   All other systems reviewed and negative.   OBJECTIVE: VS:  BP 122/74 mmHg  Pulse 77  Ht 5\' 8"  (1.727 m)  Wt 166 lb (75.297 kg)  BMI 25.25 kg/m2 Well nourished, well developed, in no acute distress, healthy appearing HEENT: normal Neck:  JVD flat. Carotid bruit absent  Cardiac:  normal S1, S2; RRR; no murmur Lungs:  clear to auscultation bilaterally, no wheezing, rhonchi or rales Abd: soft, nontender, no hepatomegaly Ext: Edema absent. Pulses 2+ Skin: warm and dry Neuro:  CNs 2-12 intact, no focal abnormalities noted  EKG:  Normal sinus rhythm with left axis deviation/left anterior hemiblock. No change from prior       Signed, [place], MD 03/14/2014 3:29 PM

## 2014-03-15 ENCOUNTER — Encounter (HOSPITAL_COMMUNITY): Payer: Self-pay

## 2014-03-18 ENCOUNTER — Encounter (HOSPITAL_COMMUNITY)
Admission: RE | Admit: 2014-03-18 | Discharge: 2014-03-18 | Disposition: A | Discharge status: Home / Self Care | Payer: Self-pay | Source: Ambulatory Visit | Attending: Interventional Cardiology | Admitting: Interventional Cardiology

## 2014-03-20 ENCOUNTER — Encounter (HOSPITAL_COMMUNITY)
Admission: RE | Admit: 2014-03-20 | Discharge: 2014-03-20 | Disposition: A | Discharge status: Home / Self Care | Payer: Self-pay | Source: Ambulatory Visit | Attending: Interventional Cardiology | Admitting: Interventional Cardiology

## 2014-03-22 ENCOUNTER — Encounter (HOSPITAL_COMMUNITY)
Admission: RE | Admit: 2014-03-22 | Discharge: 2014-03-22 | Disposition: A | Discharge status: Home / Self Care | Payer: Self-pay | Source: Ambulatory Visit | Attending: Interventional Cardiology | Admitting: Interventional Cardiology

## 2014-03-25 ENCOUNTER — Encounter (HOSPITAL_COMMUNITY)
Admission: RE | Admit: 2014-03-25 | Discharge: 2014-03-25 | Disposition: A | Discharge status: Home / Self Care | Payer: Self-pay | Source: Ambulatory Visit | Attending: Interventional Cardiology | Admitting: Interventional Cardiology

## 2014-03-25 DIAGNOSIS — D696 Thrombocytopenia, unspecified: Secondary | ICD-10-CM | POA: Insufficient documentation

## 2014-03-25 DIAGNOSIS — Z5189 Encounter for other specified aftercare: Secondary | ICD-10-CM | POA: Insufficient documentation

## 2014-03-25 DIAGNOSIS — I059 Rheumatic mitral valve disease, unspecified: Secondary | ICD-10-CM | POA: Insufficient documentation

## 2014-03-25 DIAGNOSIS — E785 Hyperlipidemia, unspecified: Secondary | ICD-10-CM | POA: Insufficient documentation

## 2014-03-25 DIAGNOSIS — Z9889 Other specified postprocedural states: Secondary | ICD-10-CM | POA: Insufficient documentation

## 2014-03-27 ENCOUNTER — Encounter (HOSPITAL_COMMUNITY)
Admission: RE | Admit: 2014-03-27 | Discharge: 2014-03-27 | Disposition: A | Discharge status: Home / Self Care | Payer: Self-pay | Source: Ambulatory Visit | Attending: Interventional Cardiology | Admitting: Interventional Cardiology

## 2014-03-29 ENCOUNTER — Encounter (HOSPITAL_COMMUNITY)
Admission: RE | Admit: 2014-03-29 | Discharge: 2014-03-29 | Disposition: A | Discharge status: Home / Self Care | Payer: Self-pay | Source: Ambulatory Visit | Attending: Interventional Cardiology | Admitting: Interventional Cardiology

## 2014-04-01 ENCOUNTER — Encounter (HOSPITAL_COMMUNITY)
Admission: RE | Admit: 2014-04-01 | Discharge: 2014-04-01 | Disposition: A | Discharge status: Home / Self Care | Payer: Self-pay | Source: Ambulatory Visit | Attending: Interventional Cardiology | Admitting: Interventional Cardiology

## 2014-04-03 ENCOUNTER — Encounter (HOSPITAL_COMMUNITY): Payer: Self-pay

## 2014-04-05 ENCOUNTER — Encounter (HOSPITAL_COMMUNITY)
Admission: RE | Admit: 2014-04-05 | Discharge: 2014-04-05 | Disposition: A | Discharge status: Home / Self Care | Payer: Self-pay | Source: Ambulatory Visit | Attending: Interventional Cardiology | Admitting: Interventional Cardiology

## 2014-04-08 ENCOUNTER — Encounter (HOSPITAL_COMMUNITY): Payer: Self-pay

## 2014-04-10 ENCOUNTER — Encounter (HOSPITAL_COMMUNITY)
Admission: RE | Admit: 2014-04-10 | Discharge: 2014-04-10 | Disposition: A | Discharge status: Home / Self Care | Payer: Self-pay | Source: Ambulatory Visit | Attending: Interventional Cardiology | Admitting: Interventional Cardiology

## 2014-04-12 ENCOUNTER — Encounter (HOSPITAL_COMMUNITY)
Admission: RE | Admit: 2014-04-12 | Discharge: 2014-04-12 | Disposition: A | Discharge status: Home / Self Care | Payer: Self-pay | Source: Ambulatory Visit | Attending: Interventional Cardiology | Admitting: Interventional Cardiology

## 2014-04-15 ENCOUNTER — Encounter (HOSPITAL_COMMUNITY)
Admission: RE | Admit: 2014-04-15 | Discharge: 2014-04-15 | Disposition: A | Discharge status: Home / Self Care | Payer: Self-pay | Source: Ambulatory Visit | Attending: Interventional Cardiology | Admitting: Interventional Cardiology

## 2014-04-17 ENCOUNTER — Encounter (HOSPITAL_COMMUNITY)
Admission: RE | Admit: 2014-04-17 | Discharge: 2014-04-17 | Disposition: A | Discharge status: Home / Self Care | Payer: Self-pay | Source: Ambulatory Visit | Attending: Interventional Cardiology | Admitting: Interventional Cardiology

## 2014-04-19 ENCOUNTER — Encounter (HOSPITAL_COMMUNITY)
Admission: RE | Admit: 2014-04-19 | Discharge: 2014-04-19 | Disposition: A | Discharge status: Home / Self Care | Payer: Self-pay | Source: Ambulatory Visit | Attending: Interventional Cardiology | Admitting: Interventional Cardiology

## 2014-04-22 ENCOUNTER — Encounter (HOSPITAL_COMMUNITY)
Admission: RE | Admit: 2014-04-22 | Discharge: 2014-04-22 | Disposition: A | Discharge status: Home / Self Care | Payer: Self-pay | Source: Ambulatory Visit | Attending: Interventional Cardiology | Admitting: Interventional Cardiology

## 2014-04-24 ENCOUNTER — Encounter (HOSPITAL_COMMUNITY)
Admission: RE | Admit: 2014-04-24 | Discharge: 2014-04-24 | Disposition: A | Discharge status: Home / Self Care | Payer: Self-pay | Source: Ambulatory Visit | Attending: Interventional Cardiology | Admitting: Interventional Cardiology

## 2014-04-24 DIAGNOSIS — E785 Hyperlipidemia, unspecified: Secondary | ICD-10-CM | POA: Insufficient documentation

## 2014-04-24 DIAGNOSIS — I059 Rheumatic mitral valve disease, unspecified: Secondary | ICD-10-CM | POA: Insufficient documentation

## 2014-04-24 DIAGNOSIS — Z5189 Encounter for other specified aftercare: Secondary | ICD-10-CM | POA: Insufficient documentation

## 2014-04-24 DIAGNOSIS — D696 Thrombocytopenia, unspecified: Secondary | ICD-10-CM | POA: Insufficient documentation

## 2014-04-24 DIAGNOSIS — Z9889 Other specified postprocedural states: Secondary | ICD-10-CM | POA: Insufficient documentation

## 2014-04-26 ENCOUNTER — Encounter (HOSPITAL_COMMUNITY)
Admission: RE | Admit: 2014-04-26 | Discharge: 2014-04-26 | Disposition: A | Discharge status: Home / Self Care | Payer: Self-pay | Source: Ambulatory Visit | Attending: Interventional Cardiology | Admitting: Interventional Cardiology

## 2014-04-29 ENCOUNTER — Encounter (HOSPITAL_COMMUNITY)
Admission: RE | Admit: 2014-04-29 | Discharge: 2014-04-29 | Disposition: A | Discharge status: Home / Self Care | Payer: Self-pay | Source: Ambulatory Visit | Attending: Interventional Cardiology | Admitting: Interventional Cardiology

## 2014-05-01 ENCOUNTER — Encounter (HOSPITAL_COMMUNITY)
Admission: RE | Admit: 2014-05-01 | Discharge: 2014-05-01 | Disposition: A | Discharge status: Home / Self Care | Payer: Self-pay | Source: Ambulatory Visit | Attending: Interventional Cardiology | Admitting: Interventional Cardiology

## 2014-05-03 ENCOUNTER — Encounter (HOSPITAL_COMMUNITY)
Admission: RE | Admit: 2014-05-03 | Discharge: 2014-05-03 | Disposition: A | Discharge status: Home / Self Care | Payer: BLUE CROSS/BLUE SHIELD | Source: Ambulatory Visit | Attending: Interventional Cardiology | Admitting: Interventional Cardiology

## 2014-05-06 ENCOUNTER — Encounter (HOSPITAL_COMMUNITY)
Admission: RE | Admit: 2014-05-06 | Discharge: 2014-05-06 | Disposition: A | Discharge status: Home / Self Care | Payer: Self-pay | Source: Ambulatory Visit | Attending: Interventional Cardiology | Admitting: Interventional Cardiology

## 2014-05-07 ENCOUNTER — Other Ambulatory Visit: Payer: Self-pay | Admitting: Interventional Cardiology

## 2014-05-08 ENCOUNTER — Encounter (HOSPITAL_COMMUNITY)
Admission: RE | Admit: 2014-05-08 | Discharge: 2014-05-08 | Disposition: A | Discharge status: Home / Self Care | Payer: Self-pay | Source: Ambulatory Visit | Attending: Interventional Cardiology | Admitting: Interventional Cardiology

## 2014-05-10 ENCOUNTER — Encounter (HOSPITAL_COMMUNITY)
Admission: RE | Admit: 2014-05-10 | Discharge: 2014-05-10 | Disposition: A | Discharge status: Home / Self Care | Payer: Self-pay | Source: Ambulatory Visit | Attending: Interventional Cardiology | Admitting: Interventional Cardiology

## 2014-05-13 ENCOUNTER — Encounter (HOSPITAL_COMMUNITY)
Admission: RE | Admit: 2014-05-13 | Discharge: 2014-05-13 | Disposition: A | Discharge status: Home / Self Care | Payer: Self-pay | Source: Ambulatory Visit | Attending: Interventional Cardiology | Admitting: Interventional Cardiology

## 2014-05-15 ENCOUNTER — Encounter (HOSPITAL_COMMUNITY)
Admission: RE | Admit: 2014-05-15 | Discharge: 2014-05-15 | Disposition: A | Discharge status: Home / Self Care | Payer: Self-pay | Source: Ambulatory Visit | Attending: Interventional Cardiology | Admitting: Interventional Cardiology

## 2014-05-17 ENCOUNTER — Encounter (HOSPITAL_COMMUNITY)
Admission: RE | Admit: 2014-05-17 | Discharge: 2014-05-17 | Disposition: A | Discharge status: Home / Self Care | Payer: Self-pay | Source: Ambulatory Visit | Attending: Interventional Cardiology | Admitting: Interventional Cardiology

## 2014-05-20 ENCOUNTER — Encounter (HOSPITAL_COMMUNITY)
Admission: RE | Admit: 2014-05-20 | Discharge: 2014-05-20 | Disposition: A | Discharge status: Home / Self Care | Payer: Self-pay | Source: Ambulatory Visit | Attending: Interventional Cardiology | Admitting: Interventional Cardiology

## 2014-05-22 ENCOUNTER — Encounter (HOSPITAL_COMMUNITY)
Admission: RE | Admit: 2014-05-22 | Discharge: 2014-05-22 | Disposition: A | Discharge status: Home / Self Care | Payer: Self-pay | Source: Ambulatory Visit | Attending: Interventional Cardiology | Admitting: Interventional Cardiology

## 2014-05-24 ENCOUNTER — Encounter (HOSPITAL_COMMUNITY)
Admission: RE | Admit: 2014-05-24 | Discharge: 2014-05-24 | Disposition: A | Discharge status: Home / Self Care | Payer: Self-pay | Source: Ambulatory Visit | Attending: Interventional Cardiology | Admitting: Interventional Cardiology

## 2014-05-24 DIAGNOSIS — E785 Hyperlipidemia, unspecified: Secondary | ICD-10-CM | POA: Insufficient documentation

## 2014-05-24 DIAGNOSIS — D696 Thrombocytopenia, unspecified: Secondary | ICD-10-CM | POA: Insufficient documentation

## 2014-05-24 DIAGNOSIS — Z5189 Encounter for other specified aftercare: Secondary | ICD-10-CM | POA: Insufficient documentation

## 2014-05-24 DIAGNOSIS — I059 Rheumatic mitral valve disease, unspecified: Secondary | ICD-10-CM | POA: Insufficient documentation

## 2014-05-24 DIAGNOSIS — Z9889 Other specified postprocedural states: Secondary | ICD-10-CM | POA: Insufficient documentation

## 2014-05-27 ENCOUNTER — Encounter (HOSPITAL_COMMUNITY)
Admission: RE | Admit: 2014-05-27 | Discharge: 2014-05-27 | Disposition: A | Discharge status: Home / Self Care | Payer: Self-pay | Source: Ambulatory Visit | Attending: Interventional Cardiology | Admitting: Interventional Cardiology

## 2014-05-29 ENCOUNTER — Encounter (HOSPITAL_COMMUNITY)
Admission: RE | Admit: 2014-05-29 | Discharge: 2014-05-29 | Disposition: A | Discharge status: Home / Self Care | Payer: Self-pay | Source: Ambulatory Visit | Attending: Interventional Cardiology | Admitting: Interventional Cardiology

## 2014-05-31 ENCOUNTER — Encounter (HOSPITAL_COMMUNITY)
Admission: RE | Admit: 2014-05-31 | Discharge: 2014-05-31 | Disposition: A | Discharge status: Home / Self Care | Payer: Self-pay | Source: Ambulatory Visit | Attending: Interventional Cardiology | Admitting: Interventional Cardiology

## 2014-06-03 ENCOUNTER — Encounter (HOSPITAL_COMMUNITY)
Admission: RE | Admit: 2014-06-03 | Discharge: 2014-06-03 | Disposition: A | Discharge status: Home / Self Care | Payer: Self-pay | Source: Ambulatory Visit | Attending: Interventional Cardiology | Admitting: Interventional Cardiology

## 2014-06-05 ENCOUNTER — Encounter (HOSPITAL_COMMUNITY)
Admission: RE | Admit: 2014-06-05 | Discharge: 2014-06-05 | Disposition: A | Discharge status: Home / Self Care | Payer: Self-pay | Source: Ambulatory Visit | Attending: Interventional Cardiology | Admitting: Interventional Cardiology

## 2014-06-07 ENCOUNTER — Encounter (HOSPITAL_COMMUNITY): Payer: Self-pay

## 2014-06-10 ENCOUNTER — Encounter (HOSPITAL_COMMUNITY)
Admission: RE | Admit: 2014-06-10 | Discharge: 2014-06-10 | Disposition: A | Discharge status: Home / Self Care | Payer: Self-pay | Source: Ambulatory Visit | Attending: Interventional Cardiology | Admitting: Interventional Cardiology

## 2014-06-12 ENCOUNTER — Encounter (HOSPITAL_COMMUNITY)
Admission: RE | Admit: 2014-06-12 | Discharge: 2014-06-12 | Disposition: A | Discharge status: Home / Self Care | Payer: Self-pay | Source: Ambulatory Visit | Attending: Interventional Cardiology | Admitting: Interventional Cardiology

## 2014-06-14 ENCOUNTER — Encounter (HOSPITAL_COMMUNITY)
Admission: RE | Admit: 2014-06-14 | Discharge: 2014-06-14 | Disposition: A | Discharge status: Home / Self Care | Payer: Self-pay | Source: Ambulatory Visit | Attending: Interventional Cardiology | Admitting: Interventional Cardiology

## 2014-06-17 ENCOUNTER — Encounter (HOSPITAL_COMMUNITY): Payer: Self-pay

## 2014-06-19 ENCOUNTER — Encounter (HOSPITAL_COMMUNITY)
Admission: RE | Admit: 2014-06-19 | Discharge: 2014-06-19 | Disposition: A | Discharge status: Home / Self Care | Payer: Self-pay | Source: Ambulatory Visit | Attending: Interventional Cardiology | Admitting: Interventional Cardiology

## 2014-06-21 ENCOUNTER — Encounter (HOSPITAL_COMMUNITY)
Admission: RE | Admit: 2014-06-21 | Discharge: 2014-06-21 | Disposition: A | Discharge status: Home / Self Care | Payer: Self-pay | Source: Ambulatory Visit | Attending: Interventional Cardiology | Admitting: Interventional Cardiology

## 2014-06-24 ENCOUNTER — Encounter (HOSPITAL_COMMUNITY)
Admission: RE | Admit: 2014-06-24 | Discharge: 2014-06-24 | Disposition: A | Discharge status: Home / Self Care | Payer: Self-pay | Source: Ambulatory Visit | Attending: Interventional Cardiology | Admitting: Interventional Cardiology

## 2014-06-24 DIAGNOSIS — I059 Rheumatic mitral valve disease, unspecified: Secondary | ICD-10-CM | POA: Insufficient documentation

## 2014-06-24 DIAGNOSIS — E785 Hyperlipidemia, unspecified: Secondary | ICD-10-CM | POA: Insufficient documentation

## 2014-06-24 DIAGNOSIS — D696 Thrombocytopenia, unspecified: Secondary | ICD-10-CM | POA: Insufficient documentation

## 2014-06-24 DIAGNOSIS — Z5189 Encounter for other specified aftercare: Secondary | ICD-10-CM | POA: Insufficient documentation

## 2014-06-24 DIAGNOSIS — Z9889 Other specified postprocedural states: Secondary | ICD-10-CM | POA: Insufficient documentation

## 2014-06-26 ENCOUNTER — Encounter (HOSPITAL_COMMUNITY)
Admission: RE | Admit: 2014-06-26 | Discharge: 2014-06-26 | Disposition: A | Discharge status: Home / Self Care | Payer: Self-pay | Source: Ambulatory Visit | Attending: Interventional Cardiology | Admitting: Interventional Cardiology

## 2014-06-28 ENCOUNTER — Encounter (HOSPITAL_COMMUNITY)
Admission: RE | Admit: 2014-06-28 | Discharge: 2014-06-28 | Disposition: A | Discharge status: Home / Self Care | Payer: Self-pay | Source: Ambulatory Visit | Attending: Interventional Cardiology | Admitting: Interventional Cardiology

## 2014-07-01 ENCOUNTER — Encounter (HOSPITAL_COMMUNITY)
Admission: RE | Admit: 2014-07-01 | Discharge: 2014-07-01 | Disposition: A | Discharge status: Home / Self Care | Payer: Self-pay | Source: Ambulatory Visit | Attending: Interventional Cardiology | Admitting: Interventional Cardiology

## 2014-07-03 ENCOUNTER — Encounter (HOSPITAL_COMMUNITY)
Admission: RE | Admit: 2014-07-03 | Discharge: 2014-07-03 | Disposition: A | Discharge status: Home / Self Care | Payer: Self-pay | Source: Ambulatory Visit | Attending: Interventional Cardiology | Admitting: Interventional Cardiology

## 2014-07-05 ENCOUNTER — Encounter (HOSPITAL_COMMUNITY)
Admission: RE | Admit: 2014-07-05 | Discharge: 2014-07-05 | Disposition: A | Discharge status: Home / Self Care | Payer: Self-pay | Source: Ambulatory Visit | Attending: Interventional Cardiology | Admitting: Interventional Cardiology

## 2014-07-08 ENCOUNTER — Encounter (HOSPITAL_COMMUNITY)
Admission: RE | Admit: 2014-07-08 | Discharge: 2014-07-08 | Disposition: A | Discharge status: Home / Self Care | Payer: Self-pay | Source: Ambulatory Visit | Attending: Interventional Cardiology | Admitting: Interventional Cardiology

## 2014-07-10 ENCOUNTER — Encounter (HOSPITAL_COMMUNITY)
Admission: RE | Admit: 2014-07-10 | Discharge: 2014-07-10 | Disposition: A | Discharge status: Home / Self Care | Payer: Self-pay | Source: Ambulatory Visit | Attending: Interventional Cardiology | Admitting: Interventional Cardiology

## 2014-07-12 ENCOUNTER — Encounter (HOSPITAL_COMMUNITY): Payer: Self-pay

## 2014-07-15 ENCOUNTER — Encounter (HOSPITAL_COMMUNITY): Payer: Self-pay

## 2014-07-17 ENCOUNTER — Encounter (HOSPITAL_COMMUNITY)
Admission: RE | Admit: 2014-07-17 | Discharge: 2014-07-17 | Disposition: A | Discharge status: Home / Self Care | Payer: Self-pay | Source: Ambulatory Visit | Attending: Interventional Cardiology | Admitting: Interventional Cardiology

## 2014-07-19 ENCOUNTER — Encounter (HOSPITAL_COMMUNITY)
Admission: RE | Admit: 2014-07-19 | Discharge: 2014-07-19 | Disposition: A | Discharge status: Home / Self Care | Payer: Self-pay | Source: Ambulatory Visit | Attending: Interventional Cardiology | Admitting: Interventional Cardiology

## 2014-07-24 ENCOUNTER — Encounter (HOSPITAL_COMMUNITY)
Admission: RE | Admit: 2014-07-24 | Discharge: 2014-07-24 | Disposition: A | Discharge status: Home / Self Care | Payer: Self-pay | Source: Ambulatory Visit | Attending: Interventional Cardiology | Admitting: Interventional Cardiology

## 2014-07-24 DIAGNOSIS — Z9889 Other specified postprocedural states: Secondary | ICD-10-CM | POA: Insufficient documentation

## 2014-07-24 DIAGNOSIS — E785 Hyperlipidemia, unspecified: Secondary | ICD-10-CM | POA: Insufficient documentation

## 2014-07-24 DIAGNOSIS — I059 Rheumatic mitral valve disease, unspecified: Secondary | ICD-10-CM | POA: Insufficient documentation

## 2014-07-24 DIAGNOSIS — D696 Thrombocytopenia, unspecified: Secondary | ICD-10-CM | POA: Insufficient documentation

## 2014-07-24 DIAGNOSIS — Z5189 Encounter for other specified aftercare: Secondary | ICD-10-CM | POA: Insufficient documentation

## 2014-07-26 ENCOUNTER — Encounter (HOSPITAL_COMMUNITY)
Admission: RE | Admit: 2014-07-26 | Discharge: 2014-07-26 | Disposition: A | Discharge status: Home / Self Care | Payer: Self-pay | Source: Ambulatory Visit | Attending: Interventional Cardiology | Admitting: Interventional Cardiology

## 2014-07-29 ENCOUNTER — Encounter (HOSPITAL_COMMUNITY)
Admission: RE | Admit: 2014-07-29 | Discharge: 2014-07-29 | Disposition: A | Discharge status: Home / Self Care | Payer: Self-pay | Source: Ambulatory Visit | Attending: Interventional Cardiology | Admitting: Interventional Cardiology

## 2014-07-31 ENCOUNTER — Encounter (HOSPITAL_COMMUNITY)
Admission: RE | Admit: 2014-07-31 | Discharge: 2014-07-31 | Disposition: A | Discharge status: Home / Self Care | Payer: Self-pay | Source: Ambulatory Visit | Attending: Interventional Cardiology | Admitting: Interventional Cardiology

## 2014-08-02 ENCOUNTER — Encounter (HOSPITAL_COMMUNITY)
Admission: RE | Admit: 2014-08-02 | Discharge: 2014-08-02 | Disposition: A | Discharge status: Home / Self Care | Payer: Self-pay | Source: Ambulatory Visit | Attending: Interventional Cardiology | Admitting: Interventional Cardiology

## 2014-08-05 ENCOUNTER — Encounter (HOSPITAL_COMMUNITY)
Admission: RE | Admit: 2014-08-05 | Discharge: 2014-08-05 | Disposition: A | Discharge status: Home / Self Care | Payer: Self-pay | Source: Ambulatory Visit | Attending: Interventional Cardiology | Admitting: Interventional Cardiology

## 2014-08-07 ENCOUNTER — Encounter (HOSPITAL_COMMUNITY)
Admission: RE | Admit: 2014-08-07 | Discharge: 2014-08-07 | Disposition: A | Discharge status: Home / Self Care | Payer: Self-pay | Source: Ambulatory Visit | Attending: Interventional Cardiology | Admitting: Interventional Cardiology

## 2014-08-09 ENCOUNTER — Encounter (HOSPITAL_COMMUNITY)
Admission: RE | Admit: 2014-08-09 | Discharge: 2014-08-09 | Disposition: A | Discharge status: Home / Self Care | Payer: Self-pay | Source: Ambulatory Visit | Attending: Interventional Cardiology | Admitting: Interventional Cardiology

## 2014-08-12 ENCOUNTER — Encounter (HOSPITAL_COMMUNITY)
Admission: RE | Admit: 2014-08-12 | Discharge: 2014-08-12 | Disposition: A | Discharge status: Home / Self Care | Payer: Self-pay | Source: Ambulatory Visit | Attending: Interventional Cardiology | Admitting: Interventional Cardiology

## 2014-08-14 ENCOUNTER — Encounter (HOSPITAL_COMMUNITY)
Admission: RE | Admit: 2014-08-14 | Discharge: 2014-08-14 | Disposition: A | Discharge status: Home / Self Care | Payer: Self-pay | Source: Ambulatory Visit | Attending: Interventional Cardiology | Admitting: Interventional Cardiology

## 2014-08-16 ENCOUNTER — Encounter (HOSPITAL_COMMUNITY)
Admission: RE | Admit: 2014-08-16 | Discharge: 2014-08-16 | Disposition: A | Discharge status: Home / Self Care | Payer: Self-pay | Source: Ambulatory Visit | Attending: Interventional Cardiology | Admitting: Interventional Cardiology

## 2014-08-19 ENCOUNTER — Encounter (HOSPITAL_COMMUNITY)
Admission: RE | Admit: 2014-08-19 | Discharge: 2014-08-19 | Disposition: A | Discharge status: Home / Self Care | Payer: Self-pay | Source: Ambulatory Visit | Attending: Interventional Cardiology | Admitting: Interventional Cardiology

## 2014-08-21 ENCOUNTER — Encounter (HOSPITAL_COMMUNITY)
Admission: RE | Admit: 2014-08-21 | Discharge: 2014-08-21 | Disposition: A | Discharge status: Home / Self Care | Payer: Self-pay | Source: Ambulatory Visit | Attending: Interventional Cardiology | Admitting: Interventional Cardiology

## 2014-08-23 ENCOUNTER — Encounter (HOSPITAL_COMMUNITY)
Admission: RE | Admit: 2014-08-23 | Discharge: 2014-08-23 | Disposition: A | Discharge status: Home / Self Care | Payer: Self-pay | Source: Ambulatory Visit | Attending: Interventional Cardiology | Admitting: Interventional Cardiology

## 2014-08-23 DIAGNOSIS — E785 Hyperlipidemia, unspecified: Secondary | ICD-10-CM | POA: Insufficient documentation

## 2014-08-23 DIAGNOSIS — Z9889 Other specified postprocedural states: Secondary | ICD-10-CM | POA: Insufficient documentation

## 2014-08-23 DIAGNOSIS — D696 Thrombocytopenia, unspecified: Secondary | ICD-10-CM | POA: Insufficient documentation

## 2014-08-23 DIAGNOSIS — Z5189 Encounter for other specified aftercare: Secondary | ICD-10-CM | POA: Insufficient documentation

## 2014-08-23 DIAGNOSIS — I059 Rheumatic mitral valve disease, unspecified: Secondary | ICD-10-CM | POA: Insufficient documentation

## 2014-08-28 ENCOUNTER — Encounter (HOSPITAL_COMMUNITY)
Admission: RE | Admit: 2014-08-28 | Discharge: 2014-08-28 | Disposition: A | Discharge status: Home / Self Care | Payer: Self-pay | Source: Ambulatory Visit | Attending: Interventional Cardiology | Admitting: Interventional Cardiology

## 2014-08-30 ENCOUNTER — Encounter (HOSPITAL_COMMUNITY)
Admission: RE | Admit: 2014-08-30 | Discharge: 2014-08-30 | Disposition: A | Discharge status: Home / Self Care | Payer: Self-pay | Source: Ambulatory Visit | Attending: Interventional Cardiology | Admitting: Interventional Cardiology

## 2014-09-02 ENCOUNTER — Encounter (HOSPITAL_COMMUNITY)
Admission: RE | Admit: 2014-09-02 | Discharge: 2014-09-02 | Disposition: A | Discharge status: Home / Self Care | Payer: Self-pay | Source: Ambulatory Visit | Attending: Interventional Cardiology | Admitting: Interventional Cardiology

## 2014-09-04 ENCOUNTER — Encounter (HOSPITAL_COMMUNITY)
Admission: RE | Admit: 2014-09-04 | Discharge: 2014-09-04 | Disposition: A | Discharge status: Home / Self Care | Payer: Self-pay | Source: Ambulatory Visit | Attending: Interventional Cardiology | Admitting: Interventional Cardiology

## 2014-09-06 ENCOUNTER — Encounter (HOSPITAL_COMMUNITY)
Admission: RE | Admit: 2014-09-06 | Discharge: 2014-09-06 | Disposition: A | Discharge status: Home / Self Care | Payer: Self-pay | Source: Ambulatory Visit | Attending: Interventional Cardiology | Admitting: Interventional Cardiology

## 2014-09-09 ENCOUNTER — Encounter (HOSPITAL_COMMUNITY)
Admission: RE | Admit: 2014-09-09 | Discharge: 2014-09-09 | Disposition: A | Discharge status: Home / Self Care | Payer: Self-pay | Source: Ambulatory Visit | Attending: Interventional Cardiology | Admitting: Interventional Cardiology

## 2014-09-11 ENCOUNTER — Encounter (HOSPITAL_COMMUNITY)
Admission: RE | Admit: 2014-09-11 | Discharge: 2014-09-11 | Disposition: A | Discharge status: Home / Self Care | Payer: Self-pay | Source: Ambulatory Visit | Attending: Interventional Cardiology | Admitting: Interventional Cardiology

## 2014-09-13 ENCOUNTER — Encounter (HOSPITAL_COMMUNITY)
Admission: RE | Admit: 2014-09-13 | Discharge: 2014-09-13 | Disposition: A | Discharge status: Home / Self Care | Payer: Self-pay | Source: Ambulatory Visit | Attending: Interventional Cardiology | Admitting: Interventional Cardiology

## 2014-09-16 ENCOUNTER — Encounter (HOSPITAL_COMMUNITY)
Admission: RE | Admit: 2014-09-16 | Discharge: 2014-09-16 | Disposition: A | Discharge status: Home / Self Care | Payer: Self-pay | Source: Ambulatory Visit | Attending: Interventional Cardiology | Admitting: Interventional Cardiology

## 2014-09-18 ENCOUNTER — Encounter (HOSPITAL_COMMUNITY)
Admission: RE | Admit: 2014-09-18 | Discharge: 2014-09-18 | Disposition: A | Discharge status: Home / Self Care | Payer: Self-pay | Source: Ambulatory Visit | Attending: Interventional Cardiology | Admitting: Interventional Cardiology

## 2014-09-20 ENCOUNTER — Encounter (HOSPITAL_COMMUNITY)
Admission: RE | Admit: 2014-09-20 | Discharge: 2014-09-20 | Disposition: A | Discharge status: Home / Self Care | Payer: Self-pay | Source: Ambulatory Visit | Attending: Interventional Cardiology | Admitting: Interventional Cardiology

## 2014-09-23 ENCOUNTER — Encounter (HOSPITAL_COMMUNITY)
Admission: RE | Admit: 2014-09-23 | Discharge: 2014-09-23 | Disposition: A | Discharge status: Home / Self Care | Payer: Self-pay | Source: Ambulatory Visit | Attending: Interventional Cardiology | Admitting: Interventional Cardiology

## 2014-09-23 DIAGNOSIS — I059 Rheumatic mitral valve disease, unspecified: Secondary | ICD-10-CM | POA: Insufficient documentation

## 2014-09-23 DIAGNOSIS — D696 Thrombocytopenia, unspecified: Secondary | ICD-10-CM | POA: Insufficient documentation

## 2014-09-23 DIAGNOSIS — Z5189 Encounter for other specified aftercare: Secondary | ICD-10-CM | POA: Insufficient documentation

## 2014-09-23 DIAGNOSIS — Z9889 Other specified postprocedural states: Secondary | ICD-10-CM | POA: Insufficient documentation

## 2014-09-23 DIAGNOSIS — E785 Hyperlipidemia, unspecified: Secondary | ICD-10-CM | POA: Insufficient documentation

## 2014-09-25 ENCOUNTER — Encounter (HOSPITAL_COMMUNITY)
Admission: RE | Admit: 2014-09-25 | Discharge: 2014-09-25 | Disposition: A | Discharge status: Home / Self Care | Payer: Self-pay | Source: Ambulatory Visit | Attending: Interventional Cardiology | Admitting: Interventional Cardiology

## 2014-09-27 ENCOUNTER — Encounter (HOSPITAL_COMMUNITY)
Admission: RE | Admit: 2014-09-27 | Discharge: 2014-09-27 | Disposition: A | Discharge status: Home / Self Care | Payer: BLUE CROSS/BLUE SHIELD | Source: Ambulatory Visit | Attending: Interventional Cardiology | Admitting: Interventional Cardiology

## 2014-09-30 ENCOUNTER — Encounter (HOSPITAL_COMMUNITY)
Admission: RE | Admit: 2014-09-30 | Discharge: 2014-09-30 | Disposition: A | Discharge status: Home / Self Care | Payer: Self-pay | Source: Ambulatory Visit | Attending: Interventional Cardiology | Admitting: Interventional Cardiology

## 2014-10-02 ENCOUNTER — Encounter (HOSPITAL_COMMUNITY)
Admission: RE | Admit: 2014-10-02 | Discharge: 2014-10-02 | Disposition: A | Discharge status: Home / Self Care | Payer: Self-pay | Source: Ambulatory Visit | Attending: Interventional Cardiology | Admitting: Interventional Cardiology

## 2014-10-04 ENCOUNTER — Encounter (HOSPITAL_COMMUNITY)
Admission: RE | Admit: 2014-10-04 | Discharge: 2014-10-04 | Disposition: A | Discharge status: Home / Self Care | Payer: Self-pay | Source: Ambulatory Visit | Attending: Interventional Cardiology | Admitting: Interventional Cardiology

## 2014-10-07 ENCOUNTER — Encounter (HOSPITAL_COMMUNITY)
Admission: RE | Admit: 2014-10-07 | Discharge: 2014-10-07 | Disposition: A | Discharge status: Home / Self Care | Payer: Self-pay | Source: Ambulatory Visit | Attending: Interventional Cardiology | Admitting: Interventional Cardiology

## 2014-10-09 ENCOUNTER — Encounter (HOSPITAL_COMMUNITY)
Admission: RE | Admit: 2014-10-09 | Discharge: 2014-10-09 | Disposition: A | Discharge status: Home / Self Care | Payer: Self-pay | Source: Ambulatory Visit | Attending: Interventional Cardiology | Admitting: Interventional Cardiology

## 2014-10-11 ENCOUNTER — Encounter (HOSPITAL_COMMUNITY)
Admission: RE | Admit: 2014-10-11 | Discharge: 2014-10-11 | Disposition: A | Discharge status: Home / Self Care | Payer: Self-pay | Source: Ambulatory Visit | Attending: Interventional Cardiology | Admitting: Interventional Cardiology

## 2014-10-14 ENCOUNTER — Encounter (HOSPITAL_COMMUNITY)
Admission: RE | Admit: 2014-10-14 | Discharge: 2014-10-14 | Disposition: A | Discharge status: Home / Self Care | Payer: Self-pay | Source: Ambulatory Visit | Attending: Interventional Cardiology | Admitting: Interventional Cardiology

## 2014-10-16 ENCOUNTER — Encounter (HOSPITAL_COMMUNITY)
Admission: RE | Admit: 2014-10-16 | Discharge: 2014-10-16 | Disposition: A | Discharge status: Home / Self Care | Payer: Self-pay | Source: Ambulatory Visit | Attending: Interventional Cardiology | Admitting: Interventional Cardiology

## 2014-10-18 ENCOUNTER — Encounter (HOSPITAL_COMMUNITY)
Admission: RE | Admit: 2014-10-18 | Discharge: 2014-10-18 | Disposition: A | Discharge status: Home / Self Care | Payer: Self-pay | Source: Ambulatory Visit | Attending: Interventional Cardiology | Admitting: Interventional Cardiology

## 2014-10-21 ENCOUNTER — Encounter (HOSPITAL_COMMUNITY)
Admission: RE | Admit: 2014-10-21 | Discharge: 2014-10-21 | Disposition: A | Discharge status: Home / Self Care | Payer: Self-pay | Source: Ambulatory Visit | Attending: Interventional Cardiology | Admitting: Interventional Cardiology

## 2014-10-23 ENCOUNTER — Encounter (HOSPITAL_COMMUNITY)
Admission: RE | Admit: 2014-10-23 | Discharge: 2014-10-23 | Disposition: A | Discharge status: Home / Self Care | Payer: Self-pay | Source: Ambulatory Visit | Attending: Interventional Cardiology | Admitting: Interventional Cardiology

## 2014-10-25 ENCOUNTER — Encounter (HOSPITAL_COMMUNITY)
Admission: RE | Admit: 2014-10-25 | Discharge: 2014-10-25 | Disposition: A | Discharge status: Home / Self Care | Payer: Self-pay | Source: Ambulatory Visit | Attending: Interventional Cardiology | Admitting: Interventional Cardiology

## 2014-10-25 DIAGNOSIS — Z9889 Other specified postprocedural states: Secondary | ICD-10-CM | POA: Insufficient documentation

## 2014-10-25 DIAGNOSIS — E785 Hyperlipidemia, unspecified: Secondary | ICD-10-CM | POA: Insufficient documentation

## 2014-10-25 DIAGNOSIS — I059 Rheumatic mitral valve disease, unspecified: Secondary | ICD-10-CM | POA: Insufficient documentation

## 2014-10-25 DIAGNOSIS — Z5189 Encounter for other specified aftercare: Secondary | ICD-10-CM | POA: Insufficient documentation

## 2014-10-25 DIAGNOSIS — D696 Thrombocytopenia, unspecified: Secondary | ICD-10-CM | POA: Insufficient documentation

## 2014-10-30 ENCOUNTER — Encounter (HOSPITAL_COMMUNITY)
Admission: RE | Admit: 2014-10-30 | Discharge: 2014-10-30 | Disposition: A | Discharge status: Home / Self Care | Payer: Self-pay | Source: Ambulatory Visit | Attending: Interventional Cardiology | Admitting: Interventional Cardiology

## 2014-11-01 ENCOUNTER — Encounter (HOSPITAL_COMMUNITY)
Admission: RE | Admit: 2014-11-01 | Discharge: 2014-11-01 | Disposition: A | Discharge status: Home / Self Care | Payer: Self-pay | Source: Ambulatory Visit | Attending: Interventional Cardiology | Admitting: Interventional Cardiology

## 2014-11-04 ENCOUNTER — Encounter (HOSPITAL_COMMUNITY)
Admission: RE | Admit: 2014-11-04 | Discharge: 2014-11-04 | Disposition: A | Discharge status: Home / Self Care | Payer: Self-pay | Source: Ambulatory Visit | Attending: Interventional Cardiology | Admitting: Interventional Cardiology

## 2014-11-06 ENCOUNTER — Encounter (HOSPITAL_COMMUNITY)
Admission: RE | Admit: 2014-11-06 | Discharge: 2014-11-06 | Disposition: A | Discharge status: Home / Self Care | Payer: Self-pay | Source: Ambulatory Visit | Attending: Interventional Cardiology | Admitting: Interventional Cardiology

## 2014-11-08 ENCOUNTER — Encounter (HOSPITAL_COMMUNITY)
Admission: RE | Admit: 2014-11-08 | Discharge: 2014-11-08 | Disposition: A | Discharge status: Home / Self Care | Payer: Self-pay | Source: Ambulatory Visit | Attending: Interventional Cardiology | Admitting: Interventional Cardiology

## 2014-11-11 ENCOUNTER — Encounter (HOSPITAL_COMMUNITY): Payer: Self-pay

## 2014-11-13 ENCOUNTER — Other Ambulatory Visit: Payer: Self-pay | Admitting: Interventional Cardiology

## 2014-11-13 ENCOUNTER — Encounter (HOSPITAL_COMMUNITY): Payer: Self-pay

## 2014-11-15 ENCOUNTER — Encounter (HOSPITAL_COMMUNITY): Payer: Self-pay

## 2014-11-18 ENCOUNTER — Encounter (HOSPITAL_COMMUNITY)
Admission: RE | Admit: 2014-11-18 | Discharge: 2014-11-18 | Disposition: A | Discharge status: Home / Self Care | Payer: Self-pay | Source: Ambulatory Visit | Attending: Interventional Cardiology | Admitting: Interventional Cardiology

## 2014-11-20 ENCOUNTER — Encounter (HOSPITAL_COMMUNITY)
Admission: RE | Admit: 2014-11-20 | Discharge: 2014-11-20 | Disposition: A | Discharge status: Home / Self Care | Payer: Self-pay | Source: Ambulatory Visit | Attending: Interventional Cardiology | Admitting: Interventional Cardiology

## 2014-11-22 ENCOUNTER — Encounter (HOSPITAL_COMMUNITY)
Admission: RE | Admit: 2014-11-22 | Discharge: 2014-11-22 | Disposition: A | Discharge status: Home / Self Care | Payer: Self-pay | Source: Ambulatory Visit | Attending: Interventional Cardiology | Admitting: Interventional Cardiology

## 2014-11-25 ENCOUNTER — Encounter (HOSPITAL_COMMUNITY)
Admission: RE | Admit: 2014-11-25 | Discharge: 2014-11-25 | Disposition: A | Discharge status: Home / Self Care | Payer: Self-pay | Source: Ambulatory Visit | Attending: Interventional Cardiology | Admitting: Interventional Cardiology

## 2014-11-25 DIAGNOSIS — Z5189 Encounter for other specified aftercare: Secondary | ICD-10-CM | POA: Insufficient documentation

## 2014-11-25 DIAGNOSIS — Z9889 Other specified postprocedural states: Secondary | ICD-10-CM | POA: Insufficient documentation

## 2014-11-25 DIAGNOSIS — D696 Thrombocytopenia, unspecified: Secondary | ICD-10-CM | POA: Insufficient documentation

## 2014-11-25 DIAGNOSIS — E785 Hyperlipidemia, unspecified: Secondary | ICD-10-CM | POA: Insufficient documentation

## 2014-11-25 DIAGNOSIS — I059 Rheumatic mitral valve disease, unspecified: Secondary | ICD-10-CM | POA: Insufficient documentation

## 2014-11-27 ENCOUNTER — Encounter (HOSPITAL_COMMUNITY)
Admission: RE | Admit: 2014-11-27 | Discharge: 2014-11-27 | Disposition: A | Discharge status: Home / Self Care | Payer: Self-pay | Source: Ambulatory Visit | Attending: Interventional Cardiology | Admitting: Interventional Cardiology

## 2014-11-29 ENCOUNTER — Encounter (HOSPITAL_COMMUNITY)
Admission: RE | Admit: 2014-11-29 | Discharge: 2014-11-29 | Disposition: A | Discharge status: Home / Self Care | Payer: Self-pay | Source: Ambulatory Visit | Attending: Interventional Cardiology | Admitting: Interventional Cardiology

## 2014-12-02 ENCOUNTER — Encounter (HOSPITAL_COMMUNITY): Payer: Self-pay

## 2014-12-04 ENCOUNTER — Encounter (HOSPITAL_COMMUNITY)
Admission: RE | Admit: 2014-12-04 | Discharge: 2014-12-04 | Disposition: A | Discharge status: Home / Self Care | Payer: Self-pay | Source: Ambulatory Visit | Attending: Interventional Cardiology | Admitting: Interventional Cardiology

## 2014-12-06 ENCOUNTER — Encounter (HOSPITAL_COMMUNITY): Payer: Self-pay

## 2014-12-09 ENCOUNTER — Encounter (HOSPITAL_COMMUNITY)
Admission: RE | Admit: 2014-12-09 | Discharge: 2014-12-09 | Disposition: A | Discharge status: Home / Self Care | Payer: Self-pay | Source: Ambulatory Visit | Attending: Interventional Cardiology | Admitting: Interventional Cardiology

## 2014-12-11 ENCOUNTER — Encounter (HOSPITAL_COMMUNITY)
Admission: RE | Admit: 2014-12-11 | Discharge: 2014-12-11 | Disposition: A | Discharge status: Home / Self Care | Payer: Self-pay | Source: Ambulatory Visit | Attending: Interventional Cardiology | Admitting: Interventional Cardiology

## 2014-12-13 ENCOUNTER — Encounter (HOSPITAL_COMMUNITY)
Admission: RE | Admit: 2014-12-13 | Discharge: 2014-12-13 | Disposition: A | Discharge status: Home / Self Care | Payer: Self-pay | Source: Ambulatory Visit | Attending: Interventional Cardiology | Admitting: Interventional Cardiology

## 2014-12-16 ENCOUNTER — Encounter (HOSPITAL_COMMUNITY): Payer: Self-pay

## 2014-12-18 ENCOUNTER — Encounter (HOSPITAL_COMMUNITY): Payer: Self-pay

## 2014-12-20 ENCOUNTER — Encounter (HOSPITAL_COMMUNITY)
Admission: RE | Admit: 2014-12-20 | Discharge: 2014-12-20 | Disposition: A | Discharge status: Home / Self Care | Payer: Self-pay | Source: Ambulatory Visit | Attending: Interventional Cardiology | Admitting: Interventional Cardiology

## 2014-12-23 ENCOUNTER — Encounter (HOSPITAL_COMMUNITY)
Admission: RE | Admit: 2014-12-23 | Discharge: 2014-12-23 | Disposition: A | Discharge status: Home / Self Care | Payer: Self-pay | Source: Ambulatory Visit | Attending: Interventional Cardiology | Admitting: Interventional Cardiology

## 2014-12-25 ENCOUNTER — Encounter (HOSPITAL_COMMUNITY)
Admission: RE | Admit: 2014-12-25 | Discharge: 2014-12-25 | Disposition: A | Discharge status: Home / Self Care | Payer: Self-pay | Source: Ambulatory Visit | Attending: Interventional Cardiology | Admitting: Interventional Cardiology

## 2014-12-25 DIAGNOSIS — E785 Hyperlipidemia, unspecified: Secondary | ICD-10-CM | POA: Insufficient documentation

## 2014-12-25 DIAGNOSIS — Z5189 Encounter for other specified aftercare: Secondary | ICD-10-CM | POA: Insufficient documentation

## 2014-12-25 DIAGNOSIS — D696 Thrombocytopenia, unspecified: Secondary | ICD-10-CM | POA: Insufficient documentation

## 2014-12-25 DIAGNOSIS — Z9889 Other specified postprocedural states: Secondary | ICD-10-CM | POA: Insufficient documentation

## 2014-12-25 DIAGNOSIS — I059 Rheumatic mitral valve disease, unspecified: Secondary | ICD-10-CM | POA: Insufficient documentation

## 2014-12-27 ENCOUNTER — Encounter (HOSPITAL_COMMUNITY)
Admission: RE | Admit: 2014-12-27 | Discharge: 2014-12-27 | Disposition: A | Discharge status: Home / Self Care | Payer: Self-pay | Source: Ambulatory Visit | Attending: Interventional Cardiology | Admitting: Interventional Cardiology

## 2014-12-30 ENCOUNTER — Encounter (HOSPITAL_COMMUNITY)
Admission: RE | Admit: 2014-12-30 | Discharge: 2014-12-30 | Disposition: A | Discharge status: Home / Self Care | Payer: Self-pay | Source: Ambulatory Visit | Attending: Interventional Cardiology | Admitting: Interventional Cardiology

## 2015-01-01 ENCOUNTER — Encounter (HOSPITAL_COMMUNITY)
Admission: RE | Admit: 2015-01-01 | Discharge: 2015-01-01 | Disposition: A | Discharge status: Home / Self Care | Payer: Self-pay | Source: Ambulatory Visit | Attending: Interventional Cardiology | Admitting: Interventional Cardiology

## 2015-01-03 ENCOUNTER — Encounter (HOSPITAL_COMMUNITY)
Admission: RE | Admit: 2015-01-03 | Discharge: 2015-01-03 | Disposition: A | Discharge status: Home / Self Care | Payer: Self-pay | Source: Ambulatory Visit | Attending: Interventional Cardiology | Admitting: Interventional Cardiology

## 2015-01-06 ENCOUNTER — Encounter (HOSPITAL_COMMUNITY)
Admission: RE | Admit: 2015-01-06 | Discharge: 2015-01-06 | Disposition: A | Discharge status: Home / Self Care | Payer: Self-pay | Source: Ambulatory Visit | Attending: Interventional Cardiology | Admitting: Interventional Cardiology

## 2015-01-08 ENCOUNTER — Encounter (HOSPITAL_COMMUNITY)
Admission: RE | Admit: 2015-01-08 | Discharge: 2015-01-08 | Disposition: A | Discharge status: Home / Self Care | Payer: Self-pay | Source: Ambulatory Visit | Attending: Interventional Cardiology | Admitting: Interventional Cardiology

## 2015-01-10 ENCOUNTER — Encounter (HOSPITAL_COMMUNITY)
Admission: RE | Admit: 2015-01-10 | Discharge: 2015-01-10 | Disposition: A | Discharge status: Home / Self Care | Payer: Self-pay | Source: Ambulatory Visit | Attending: Interventional Cardiology | Admitting: Interventional Cardiology

## 2015-01-13 ENCOUNTER — Encounter (HOSPITAL_COMMUNITY)
Admission: RE | Admit: 2015-01-13 | Discharge: 2015-01-13 | Disposition: A | Discharge status: Home / Self Care | Payer: Self-pay | Source: Ambulatory Visit | Attending: Interventional Cardiology | Admitting: Interventional Cardiology

## 2015-01-15 ENCOUNTER — Encounter (HOSPITAL_COMMUNITY)
Admission: RE | Admit: 2015-01-15 | Discharge: 2015-01-15 | Disposition: A | Discharge status: Home / Self Care | Payer: Self-pay | Source: Ambulatory Visit | Attending: Interventional Cardiology | Admitting: Interventional Cardiology

## 2015-01-20 ENCOUNTER — Encounter (HOSPITAL_COMMUNITY): Payer: Self-pay

## 2015-01-22 ENCOUNTER — Encounter (HOSPITAL_COMMUNITY): Payer: Self-pay

## 2015-01-24 ENCOUNTER — Encounter (HOSPITAL_COMMUNITY): Payer: Self-pay

## 2015-01-24 DIAGNOSIS — Z9889 Other specified postprocedural states: Secondary | ICD-10-CM | POA: Insufficient documentation

## 2015-01-24 DIAGNOSIS — D696 Thrombocytopenia, unspecified: Secondary | ICD-10-CM | POA: Insufficient documentation

## 2015-01-24 DIAGNOSIS — I059 Rheumatic mitral valve disease, unspecified: Secondary | ICD-10-CM | POA: Insufficient documentation

## 2015-01-24 DIAGNOSIS — Z5189 Encounter for other specified aftercare: Secondary | ICD-10-CM | POA: Insufficient documentation

## 2015-01-24 DIAGNOSIS — E785 Hyperlipidemia, unspecified: Secondary | ICD-10-CM | POA: Insufficient documentation

## 2015-01-27 ENCOUNTER — Encounter (HOSPITAL_COMMUNITY)
Admission: RE | Admit: 2015-01-27 | Discharge: 2015-01-27 | Disposition: A | Discharge status: Home / Self Care | Payer: Self-pay | Source: Ambulatory Visit | Attending: Interventional Cardiology | Admitting: Interventional Cardiology

## 2015-01-29 ENCOUNTER — Encounter (HOSPITAL_COMMUNITY)
Admission: RE | Admit: 2015-01-29 | Discharge: 2015-01-29 | Disposition: A | Discharge status: Home / Self Care | Payer: Self-pay | Source: Ambulatory Visit | Attending: Interventional Cardiology | Admitting: Interventional Cardiology

## 2015-01-31 ENCOUNTER — Encounter (HOSPITAL_COMMUNITY)
Admission: RE | Admit: 2015-01-31 | Discharge: 2015-01-31 | Disposition: A | Discharge status: Home / Self Care | Payer: Self-pay | Source: Ambulatory Visit | Attending: Interventional Cardiology | Admitting: Interventional Cardiology

## 2015-02-03 ENCOUNTER — Encounter (HOSPITAL_COMMUNITY)
Admission: RE | Admit: 2015-02-03 | Discharge: 2015-02-03 | Disposition: A | Discharge status: Home / Self Care | Payer: Self-pay | Source: Ambulatory Visit | Attending: Interventional Cardiology | Admitting: Interventional Cardiology

## 2015-02-05 ENCOUNTER — Encounter (HOSPITAL_COMMUNITY)
Admission: RE | Admit: 2015-02-05 | Discharge: 2015-02-05 | Disposition: A | Discharge status: Home / Self Care | Payer: Self-pay | Source: Ambulatory Visit | Attending: Interventional Cardiology | Admitting: Interventional Cardiology

## 2015-02-07 ENCOUNTER — Encounter (HOSPITAL_COMMUNITY)
Admission: RE | Admit: 2015-02-07 | Discharge: 2015-02-07 | Disposition: A | Discharge status: Home / Self Care | Payer: Self-pay | Source: Ambulatory Visit | Attending: Interventional Cardiology | Admitting: Interventional Cardiology

## 2015-02-10 ENCOUNTER — Encounter (HOSPITAL_COMMUNITY)
Admission: RE | Admit: 2015-02-10 | Discharge: 2015-02-10 | Disposition: A | Discharge status: Home / Self Care | Payer: Self-pay | Source: Ambulatory Visit | Attending: Interventional Cardiology | Admitting: Interventional Cardiology

## 2015-02-12 ENCOUNTER — Encounter (HOSPITAL_COMMUNITY)
Admission: RE | Admit: 2015-02-12 | Discharge: 2015-02-12 | Disposition: A | Discharge status: Home / Self Care | Payer: Self-pay | Source: Ambulatory Visit | Attending: Interventional Cardiology | Admitting: Interventional Cardiology

## 2015-02-14 ENCOUNTER — Encounter (HOSPITAL_COMMUNITY)
Admission: RE | Admit: 2015-02-14 | Discharge: 2015-02-14 | Disposition: A | Discharge status: Home / Self Care | Payer: Self-pay | Source: Ambulatory Visit | Attending: Interventional Cardiology | Admitting: Interventional Cardiology

## 2015-02-19 ENCOUNTER — Encounter (HOSPITAL_COMMUNITY)
Admission: RE | Admit: 2015-02-19 | Discharge: 2015-02-19 | Disposition: A | Discharge status: Home / Self Care | Payer: Self-pay | Source: Ambulatory Visit | Attending: Interventional Cardiology | Admitting: Interventional Cardiology

## 2015-02-19 ENCOUNTER — Other Ambulatory Visit: Payer: Self-pay | Admitting: Interventional Cardiology

## 2015-02-21 ENCOUNTER — Encounter (HOSPITAL_COMMUNITY)
Admission: RE | Admit: 2015-02-21 | Discharge: 2015-02-21 | Disposition: A | Discharge status: Home / Self Care | Payer: Self-pay | Source: Ambulatory Visit | Attending: Interventional Cardiology | Admitting: Interventional Cardiology

## 2015-02-26 ENCOUNTER — Encounter (HOSPITAL_COMMUNITY)
Admission: RE | Admit: 2015-02-26 | Discharge: 2015-02-26 | Disposition: A | Discharge status: Home / Self Care | Payer: Self-pay | Source: Ambulatory Visit | Attending: Interventional Cardiology | Admitting: Interventional Cardiology

## 2015-02-26 DIAGNOSIS — Z5189 Encounter for other specified aftercare: Secondary | ICD-10-CM | POA: Insufficient documentation

## 2015-02-26 DIAGNOSIS — E785 Hyperlipidemia, unspecified: Secondary | ICD-10-CM | POA: Insufficient documentation

## 2015-02-26 DIAGNOSIS — I059 Rheumatic mitral valve disease, unspecified: Secondary | ICD-10-CM | POA: Insufficient documentation

## 2015-02-26 DIAGNOSIS — D696 Thrombocytopenia, unspecified: Secondary | ICD-10-CM | POA: Insufficient documentation

## 2015-02-26 DIAGNOSIS — Z9889 Other specified postprocedural states: Secondary | ICD-10-CM | POA: Insufficient documentation

## 2015-02-28 ENCOUNTER — Encounter (HOSPITAL_COMMUNITY)
Admission: RE | Admit: 2015-02-28 | Discharge: 2015-02-28 | Disposition: A | Discharge status: Home / Self Care | Payer: Self-pay | Source: Ambulatory Visit | Attending: Interventional Cardiology | Admitting: Interventional Cardiology

## 2015-03-03 ENCOUNTER — Encounter (HOSPITAL_COMMUNITY)
Admission: RE | Admit: 2015-03-03 | Discharge: 2015-03-03 | Disposition: A | Discharge status: Home / Self Care | Payer: Self-pay | Source: Ambulatory Visit | Attending: Interventional Cardiology | Admitting: Interventional Cardiology

## 2015-03-05 ENCOUNTER — Encounter (HOSPITAL_COMMUNITY)
Admission: RE | Admit: 2015-03-05 | Discharge: 2015-03-05 | Disposition: A | Discharge status: Home / Self Care | Payer: Self-pay | Source: Ambulatory Visit | Attending: Interventional Cardiology | Admitting: Interventional Cardiology

## 2015-03-07 ENCOUNTER — Encounter (HOSPITAL_COMMUNITY)
Admission: RE | Admit: 2015-03-07 | Discharge: 2015-03-07 | Disposition: A | Discharge status: Home / Self Care | Payer: Self-pay | Source: Ambulatory Visit | Attending: Interventional Cardiology | Admitting: Interventional Cardiology

## 2015-03-10 ENCOUNTER — Encounter (HOSPITAL_COMMUNITY)
Admission: RE | Admit: 2015-03-10 | Discharge: 2015-03-10 | Disposition: A | Discharge status: Home / Self Care | Payer: Self-pay | Source: Ambulatory Visit | Attending: Interventional Cardiology | Admitting: Interventional Cardiology

## 2015-03-12 ENCOUNTER — Encounter (HOSPITAL_COMMUNITY)
Admission: RE | Admit: 2015-03-12 | Discharge: 2015-03-12 | Disposition: A | Discharge status: Home / Self Care | Payer: Self-pay | Source: Ambulatory Visit | Attending: Interventional Cardiology | Admitting: Interventional Cardiology

## 2015-03-14 ENCOUNTER — Encounter (HOSPITAL_COMMUNITY)
Admission: RE | Admit: 2015-03-14 | Discharge: 2015-03-14 | Disposition: A | Discharge status: Home / Self Care | Payer: Self-pay | Source: Ambulatory Visit | Attending: Interventional Cardiology | Admitting: Interventional Cardiology

## 2015-03-17 ENCOUNTER — Encounter (HOSPITAL_COMMUNITY)
Admission: RE | Admit: 2015-03-17 | Discharge: 2015-03-17 | Disposition: A | Discharge status: Home / Self Care | Payer: Self-pay | Source: Ambulatory Visit | Attending: Interventional Cardiology | Admitting: Interventional Cardiology

## 2015-03-19 ENCOUNTER — Encounter (HOSPITAL_COMMUNITY)
Admission: RE | Admit: 2015-03-19 | Discharge: 2015-03-19 | Disposition: A | Discharge status: Home / Self Care | Payer: Self-pay | Source: Ambulatory Visit | Attending: Interventional Cardiology | Admitting: Interventional Cardiology

## 2015-03-21 ENCOUNTER — Encounter (HOSPITAL_COMMUNITY)
Admission: RE | Admit: 2015-03-21 | Discharge: 2015-03-21 | Disposition: A | Discharge status: Home / Self Care | Payer: Self-pay | Source: Ambulatory Visit | Attending: Interventional Cardiology | Admitting: Interventional Cardiology

## 2015-03-24 ENCOUNTER — Encounter (HOSPITAL_COMMUNITY)
Admission: RE | Admit: 2015-03-24 | Discharge: 2015-03-24 | Disposition: A | Discharge status: Home / Self Care | Payer: Self-pay | Source: Ambulatory Visit | Attending: Interventional Cardiology | Admitting: Interventional Cardiology

## 2015-03-26 ENCOUNTER — Encounter (HOSPITAL_COMMUNITY)
Admission: RE | Admit: 2015-03-26 | Discharge: 2015-03-26 | Disposition: A | Discharge status: Home / Self Care | Payer: Self-pay | Source: Ambulatory Visit | Attending: Interventional Cardiology | Admitting: Interventional Cardiology

## 2015-03-26 DIAGNOSIS — Z954 Presence of other heart-valve replacement: Secondary | ICD-10-CM | POA: Insufficient documentation

## 2015-03-28 ENCOUNTER — Encounter (HOSPITAL_COMMUNITY)
Admission: RE | Admit: 2015-03-28 | Discharge: 2015-03-28 | Disposition: A | Discharge status: Home / Self Care | Payer: Self-pay | Source: Ambulatory Visit | Attending: Interventional Cardiology | Admitting: Interventional Cardiology

## 2015-03-31 ENCOUNTER — Encounter (HOSPITAL_COMMUNITY)
Admission: RE | Admit: 2015-03-31 | Discharge: 2015-03-31 | Disposition: A | Discharge status: Home / Self Care | Payer: Self-pay | Source: Ambulatory Visit | Attending: Interventional Cardiology | Admitting: Interventional Cardiology

## 2015-03-31 ENCOUNTER — Telehealth: Payer: Self-pay | Admitting: Interventional Cardiology

## 2015-03-31 NOTE — Telephone Encounter (Signed)
Called pt and left message asking pt to call back to update his Fm and medical Hx.

## 2015-04-02 ENCOUNTER — Encounter (HOSPITAL_COMMUNITY)
Admission: RE | Admit: 2015-04-02 | Discharge: 2015-04-02 | Disposition: A | Discharge status: Home / Self Care | Payer: Self-pay | Source: Ambulatory Visit | Attending: Interventional Cardiology | Admitting: Interventional Cardiology

## 2015-04-04 ENCOUNTER — Encounter (HOSPITAL_COMMUNITY)
Admission: RE | Admit: 2015-04-04 | Discharge: 2015-04-04 | Disposition: A | Discharge status: Home / Self Care | Payer: Self-pay | Source: Ambulatory Visit | Attending: Interventional Cardiology | Admitting: Interventional Cardiology

## 2015-04-07 ENCOUNTER — Encounter (HOSPITAL_COMMUNITY)
Admission: RE | Admit: 2015-04-07 | Discharge: 2015-04-07 | Disposition: A | Discharge status: Home / Self Care | Payer: Self-pay | Source: Ambulatory Visit | Attending: Interventional Cardiology | Admitting: Interventional Cardiology

## 2015-04-08 ENCOUNTER — Encounter: Payer: Self-pay | Admitting: Interventional Cardiology

## 2015-04-09 ENCOUNTER — Ambulatory Visit (INDEPENDENT_AMBULATORY_CARE_PROVIDER_SITE_OTHER): Payer: Medicare Other | Admitting: Interventional Cardiology

## 2015-04-09 ENCOUNTER — Encounter: Payer: Self-pay | Admitting: Interventional Cardiology

## 2015-04-09 ENCOUNTER — Encounter (HOSPITAL_COMMUNITY)
Admission: RE | Admit: 2015-04-09 | Discharge: 2015-04-09 | Disposition: A | Discharge status: Home / Self Care | Payer: Self-pay | Source: Ambulatory Visit | Attending: Interventional Cardiology | Admitting: Interventional Cardiology

## 2015-04-09 VITALS — BP 110/74 | HR 81 | Ht 68.0 in | Wt 167.8 lb

## 2015-04-09 DIAGNOSIS — Z9889 Other specified postprocedural states: Secondary | ICD-10-CM | POA: Diagnosis not present

## 2015-04-09 DIAGNOSIS — I2510 Atherosclerotic heart disease of native coronary artery without angina pectoris: Secondary | ICD-10-CM

## 2015-04-09 DIAGNOSIS — I10 Essential (primary) hypertension: Secondary | ICD-10-CM

## 2015-04-09 DIAGNOSIS — E785 Hyperlipidemia, unspecified: Secondary | ICD-10-CM

## 2015-04-09 NOTE — Patient Instructions (Signed)

## 2015-04-09 NOTE — Progress Notes (Signed)
Cardiology Office Note   Date:  04/09/2015   ID:  Jerry Tanner, DOB 04-18-1950, MRN [id number]  PCP:  Henrine Screws, MD  Cardiologist:  Jerry Grooms, MD   Chief Complaint  Patient presents with  . Coronary Artery Disease      History of Present Illness: Jerry Tanner is a 65 y.o. male who presents for  Asymptomatic LAD CAD , status post mitral valve repair, hyperlipidemia, and essential hypertension.  [name] is doing well. He denies chest discomfort , dyspnea, palpitations, and syncope. He is still active in cardiac rehabilitation. He has no specific complaints. He wonders what we'll do another myocardial perfusion study given his known coronary disease.   There has been no change in his exertional tolerance since  He began in cardiac rehabilitation 4 years ago. He goes 3 days per week.    Past Medical History  Diagnosis Date  . H/O mitral valve repair   . Heart disease     Past Surgical History  Procedure Laterality Date  . Mitral valve repair    . Tonsillectomy       Current Outpatient Prescriptions  Medication Sig Dispense Refill  . citalopram (CELEXA) 40 MG tablet Take 20 mg by mouth daily.     . metoprolol succinate (TOPROL-XL) 25 MG 24 hr tablet Take 25 mg by mouth daily.    . simvastatin (ZOCOR) 20 MG tablet Take 20 mg by mouth daily.      No current facility-administered medications for this visit.    Allergies:   Review of patient's allergies indicates no known allergies.    Social History:  The patient  reports that he has never smoked. He has never used smokeless tobacco. He reports that he does not drink alcohol or use illicit drugs.   Family History:  The patient's family history includes Breast cancer in his mother; Heart Problems in his father and mother.    ROS:  Please see the history of present illness.   Otherwise, review of systems are positive for  Back discomfort , snoring, anxiety, and knee discomfort..   All other systems are  reviewed and negative.    PHYSICAL EXAM: VS:  BP 110/74 mmHg  Pulse 81  Ht 5\' 8"  (1.727 m)  Wt 167 lb 12.8 oz (76.114 kg)  BMI 25.52 kg/m2 , BMI Body mass index is 25.52 kg/(m^2). GEN: Well nourished, well developed, in no acute distress HEENT: normal Neck: no JVD, carotid bruits, or masses Cardiac:  RRR.  There is no murmur, rub, or gallop. There is no edema. Respiratory:  clear to auscultation bilaterally, normal work of breathing. GI: soft, nontender, nondistended, + BS MS: no deformity or atrophy Skin: warm and dry, no rash Neuro:  Strength and sensation are intact Psych: euthymic mood, full affect   EKG:  EKG is ordered today. The ekg reveals  Normal sinus rhythm, left atrial abnormality, left anterior hemiblock, poor R-wave progression. First-degree AV block. No change from prior.   Recent Labs: No results found for requested labs within last 365 days.    Lipid Panel No results found for: CHOL, TRIG, HDL, CHOLHDL, VLDL, LDLCALC, LDLDIRECT    Wt Readings from Last 3 Encounters:  04/09/15 167 lb 12.8 oz (76.114 kg)  03/14/14 166 lb (75.297 kg)  02/01/13 177 lb (80.287 kg)      Other studies Reviewed: Additional studies/ records that were reviewed today include:  Review of all old data.. The findings include  Nothing new.  ASSESSMENT AND PLAN:  1. Hx of mitral valve repair  no evidence of regurgitation on exam  2. Atherosclerosis of native coronary artery of native heart without angina pectoris  Asymptomatic - EKG 12-Lead  3. Essential hypertension, benign  Well controlled - EKG 12-Lead  4. Hyperlipidemia  followed by primary care    Current medicines are reviewed at length with the patient today.  The patient has the following concerns regarding medicines:  none.  The following changes/actions have been instituted:     consider myocardial perfusion imaging neck shear   Active lifestyle  Labs/ tests ordered today include:  No orders of the  defined types were placed in this encounter.     Disposition:   FU with HS in 1 year  Signed, Jerry Grooms, MD  04/09/2015 4:05 PM    [facility] [place], [place], [place]  [address] Phone: [phone]; Fax: [phone]

## 2015-04-11 ENCOUNTER — Encounter (HOSPITAL_COMMUNITY)
Admission: RE | Admit: 2015-04-11 | Discharge: 2015-04-11 | Disposition: A | Discharge status: Home / Self Care | Payer: Self-pay | Source: Ambulatory Visit | Attending: Interventional Cardiology | Admitting: Interventional Cardiology

## 2015-04-14 ENCOUNTER — Encounter (HOSPITAL_COMMUNITY): Payer: Self-pay

## 2015-04-16 ENCOUNTER — Encounter (HOSPITAL_COMMUNITY): Payer: Self-pay

## 2015-04-18 ENCOUNTER — Encounter (HOSPITAL_COMMUNITY)
Admission: RE | Admit: 2015-04-18 | Discharge: 2015-04-18 | Disposition: A | Discharge status: Home / Self Care | Payer: Self-pay | Source: Ambulatory Visit | Attending: Interventional Cardiology | Admitting: Interventional Cardiology

## 2015-04-21 ENCOUNTER — Encounter (HOSPITAL_COMMUNITY): Payer: Self-pay

## 2015-04-22 ENCOUNTER — Other Ambulatory Visit: Payer: Self-pay | Admitting: Interventional Cardiology

## 2015-04-23 ENCOUNTER — Encounter (HOSPITAL_COMMUNITY): Payer: Self-pay

## 2015-04-23 DIAGNOSIS — Z954 Presence of other heart-valve replacement: Secondary | ICD-10-CM | POA: Insufficient documentation

## 2015-04-25 ENCOUNTER — Encounter (HOSPITAL_COMMUNITY): Payer: Self-pay

## 2015-04-28 ENCOUNTER — Encounter (HOSPITAL_COMMUNITY): Payer: Self-pay

## 2015-04-30 ENCOUNTER — Encounter (HOSPITAL_COMMUNITY)
Admission: RE | Admit: 2015-04-30 | Discharge: 2015-04-30 | Disposition: A | Discharge status: Home / Self Care | Payer: Self-pay | Source: Ambulatory Visit | Attending: Interventional Cardiology | Admitting: Interventional Cardiology

## 2015-05-02 ENCOUNTER — Encounter (HOSPITAL_COMMUNITY): Payer: Self-pay

## 2015-05-05 ENCOUNTER — Encounter (HOSPITAL_COMMUNITY)
Admission: RE | Admit: 2015-05-05 | Discharge: 2015-05-05 | Disposition: A | Discharge status: Home / Self Care | Payer: Self-pay | Source: Ambulatory Visit | Attending: Interventional Cardiology | Admitting: Interventional Cardiology

## 2015-05-07 ENCOUNTER — Encounter (HOSPITAL_COMMUNITY)
Admission: RE | Admit: 2015-05-07 | Discharge: 2015-05-07 | Disposition: A | Discharge status: Home / Self Care | Payer: Self-pay | Source: Ambulatory Visit | Attending: Interventional Cardiology | Admitting: Interventional Cardiology

## 2015-05-09 ENCOUNTER — Encounter (HOSPITAL_COMMUNITY)
Admission: RE | Admit: 2015-05-09 | Discharge: 2015-05-09 | Disposition: A | Discharge status: Home / Self Care | Payer: Self-pay | Source: Ambulatory Visit | Attending: Interventional Cardiology | Admitting: Interventional Cardiology

## 2015-05-12 ENCOUNTER — Encounter (HOSPITAL_COMMUNITY)
Admission: RE | Admit: 2015-05-12 | Discharge: 2015-05-12 | Disposition: A | Discharge status: Home / Self Care | Payer: Self-pay | Source: Ambulatory Visit | Attending: Interventional Cardiology | Admitting: Interventional Cardiology

## 2015-05-14 ENCOUNTER — Encounter (HOSPITAL_COMMUNITY)
Admission: RE | Admit: 2015-05-14 | Discharge: 2015-05-14 | Disposition: A | Discharge status: Home / Self Care | Payer: Self-pay | Source: Ambulatory Visit | Attending: Interventional Cardiology | Admitting: Interventional Cardiology

## 2015-05-16 ENCOUNTER — Encounter (HOSPITAL_COMMUNITY)
Admission: RE | Admit: 2015-05-16 | Discharge: 2015-05-16 | Disposition: A | Discharge status: Home / Self Care | Payer: Self-pay | Source: Ambulatory Visit | Attending: Interventional Cardiology | Admitting: Interventional Cardiology

## 2015-05-19 ENCOUNTER — Encounter (HOSPITAL_COMMUNITY)
Admission: RE | Admit: 2015-05-19 | Discharge: 2015-05-19 | Disposition: A | Discharge status: Home / Self Care | Payer: Self-pay | Source: Ambulatory Visit | Attending: Interventional Cardiology | Admitting: Interventional Cardiology

## 2015-05-21 ENCOUNTER — Encounter (HOSPITAL_COMMUNITY)
Admission: RE | Admit: 2015-05-21 | Discharge: 2015-05-21 | Disposition: A | Discharge status: Home / Self Care | Payer: Self-pay | Source: Ambulatory Visit | Attending: Interventional Cardiology | Admitting: Interventional Cardiology

## 2015-05-23 ENCOUNTER — Encounter (HOSPITAL_COMMUNITY)
Admission: RE | Admit: 2015-05-23 | Discharge: 2015-05-23 | Disposition: A | Discharge status: Home / Self Care | Payer: Self-pay | Source: Ambulatory Visit | Attending: Interventional Cardiology | Admitting: Interventional Cardiology

## 2015-05-26 ENCOUNTER — Encounter (HOSPITAL_COMMUNITY)
Admission: RE | Admit: 2015-05-26 | Discharge: 2015-05-26 | Disposition: A | Discharge status: Home / Self Care | Payer: Self-pay | Source: Ambulatory Visit | Attending: Interventional Cardiology | Admitting: Interventional Cardiology

## 2015-05-26 DIAGNOSIS — Z952 Presence of prosthetic heart valve: Secondary | ICD-10-CM | POA: Insufficient documentation

## 2015-05-28 ENCOUNTER — Encounter (HOSPITAL_COMMUNITY)
Admission: RE | Admit: 2015-05-28 | Discharge: 2015-05-28 | Disposition: A | Discharge status: Home / Self Care | Payer: Self-pay | Source: Ambulatory Visit | Attending: Interventional Cardiology | Admitting: Interventional Cardiology

## 2015-05-30 ENCOUNTER — Encounter (HOSPITAL_COMMUNITY)
Admission: RE | Admit: 2015-05-30 | Discharge: 2015-05-30 | Disposition: A | Discharge status: Home / Self Care | Payer: Self-pay | Source: Ambulatory Visit | Attending: Interventional Cardiology | Admitting: Interventional Cardiology

## 2015-06-02 ENCOUNTER — Encounter (HOSPITAL_COMMUNITY)
Admission: RE | Admit: 2015-06-02 | Discharge: 2015-06-02 | Disposition: A | Discharge status: Home / Self Care | Payer: Self-pay | Source: Ambulatory Visit | Attending: Interventional Cardiology | Admitting: Interventional Cardiology

## 2015-06-04 ENCOUNTER — Encounter (HOSPITAL_COMMUNITY): Payer: Self-pay

## 2015-06-06 ENCOUNTER — Encounter (HOSPITAL_COMMUNITY)
Admission: RE | Admit: 2015-06-06 | Discharge: 2015-06-06 | Disposition: A | Discharge status: Home / Self Care | Payer: Self-pay | Source: Ambulatory Visit | Attending: Interventional Cardiology | Admitting: Interventional Cardiology

## 2015-06-09 ENCOUNTER — Encounter (HOSPITAL_COMMUNITY)
Admission: RE | Admit: 2015-06-09 | Discharge: 2015-06-09 | Disposition: A | Discharge status: Home / Self Care | Payer: Self-pay | Source: Ambulatory Visit | Attending: Interventional Cardiology | Admitting: Interventional Cardiology

## 2015-06-11 ENCOUNTER — Encounter (HOSPITAL_COMMUNITY)
Admission: RE | Admit: 2015-06-11 | Discharge: 2015-06-11 | Disposition: A | Discharge status: Home / Self Care | Payer: Self-pay | Source: Ambulatory Visit | Attending: Interventional Cardiology | Admitting: Interventional Cardiology

## 2015-06-13 ENCOUNTER — Encounter (HOSPITAL_COMMUNITY)
Admission: RE | Admit: 2015-06-13 | Discharge: 2015-06-13 | Disposition: A | Discharge status: Home / Self Care | Payer: Self-pay | Source: Ambulatory Visit | Attending: Interventional Cardiology | Admitting: Interventional Cardiology

## 2015-06-16 ENCOUNTER — Encounter (HOSPITAL_COMMUNITY)
Admission: RE | Admit: 2015-06-16 | Discharge: 2015-06-16 | Disposition: A | Discharge status: Home / Self Care | Payer: Self-pay | Source: Ambulatory Visit | Attending: Interventional Cardiology | Admitting: Interventional Cardiology

## 2015-06-18 ENCOUNTER — Encounter (HOSPITAL_COMMUNITY)
Admission: RE | Admit: 2015-06-18 | Discharge: 2015-06-18 | Disposition: A | Discharge status: Home / Self Care | Payer: Self-pay | Source: Ambulatory Visit | Attending: Interventional Cardiology | Admitting: Interventional Cardiology

## 2015-06-20 ENCOUNTER — Encounter (HOSPITAL_COMMUNITY)
Admission: RE | Admit: 2015-06-20 | Discharge: 2015-06-20 | Disposition: A | Discharge status: Home / Self Care | Payer: Self-pay | Source: Ambulatory Visit | Attending: Interventional Cardiology | Admitting: Interventional Cardiology

## 2015-06-23 ENCOUNTER — Encounter (HOSPITAL_COMMUNITY)
Admission: RE | Admit: 2015-06-23 | Discharge: 2015-06-23 | Disposition: A | Discharge status: Home / Self Care | Payer: Self-pay | Source: Ambulatory Visit | Attending: Interventional Cardiology | Admitting: Interventional Cardiology

## 2015-06-23 DIAGNOSIS — Z952 Presence of prosthetic heart valve: Secondary | ICD-10-CM | POA: Insufficient documentation

## 2015-06-25 ENCOUNTER — Encounter (HOSPITAL_COMMUNITY)
Admission: RE | Admit: 2015-06-25 | Discharge: 2015-06-25 | Disposition: A | Discharge status: Home / Self Care | Payer: Self-pay | Source: Ambulatory Visit | Attending: Interventional Cardiology | Admitting: Interventional Cardiology

## 2015-06-27 ENCOUNTER — Encounter (HOSPITAL_COMMUNITY)
Admission: RE | Admit: 2015-06-27 | Discharge: 2015-06-27 | Disposition: A | Discharge status: Home / Self Care | Payer: Self-pay | Source: Ambulatory Visit | Attending: Interventional Cardiology | Admitting: Interventional Cardiology

## 2015-06-30 ENCOUNTER — Encounter (HOSPITAL_COMMUNITY)
Admission: RE | Admit: 2015-06-30 | Discharge: 2015-06-30 | Disposition: A | Discharge status: Home / Self Care | Payer: Self-pay | Source: Ambulatory Visit | Attending: Interventional Cardiology | Admitting: Interventional Cardiology

## 2015-07-02 ENCOUNTER — Encounter (HOSPITAL_COMMUNITY)
Admission: RE | Admit: 2015-07-02 | Discharge: 2015-07-02 | Disposition: A | Discharge status: Home / Self Care | Payer: Self-pay | Source: Ambulatory Visit | Attending: Interventional Cardiology | Admitting: Interventional Cardiology

## 2015-07-04 ENCOUNTER — Encounter (HOSPITAL_COMMUNITY)
Admission: RE | Admit: 2015-07-04 | Discharge: 2015-07-04 | Disposition: A | Discharge status: Home / Self Care | Payer: Medicare Other | Source: Ambulatory Visit | Attending: Interventional Cardiology | Admitting: Interventional Cardiology

## 2015-07-07 ENCOUNTER — Encounter (HOSPITAL_COMMUNITY)
Admission: RE | Admit: 2015-07-07 | Discharge: 2015-07-07 | Disposition: A | Discharge status: Home / Self Care | Payer: Self-pay | Source: Ambulatory Visit | Attending: Interventional Cardiology | Admitting: Interventional Cardiology

## 2015-07-09 ENCOUNTER — Encounter (HOSPITAL_COMMUNITY)
Admission: RE | Admit: 2015-07-09 | Discharge: 2015-07-09 | Disposition: A | Discharge status: Home / Self Care | Payer: Self-pay | Source: Ambulatory Visit | Attending: Interventional Cardiology | Admitting: Interventional Cardiology

## 2015-07-11 ENCOUNTER — Encounter (HOSPITAL_COMMUNITY)
Admission: RE | Admit: 2015-07-11 | Discharge: 2015-07-11 | Disposition: A | Discharge status: Home / Self Care | Payer: Self-pay | Source: Ambulatory Visit | Attending: Interventional Cardiology | Admitting: Interventional Cardiology

## 2015-07-14 ENCOUNTER — Encounter (HOSPITAL_COMMUNITY)
Admission: RE | Admit: 2015-07-14 | Discharge: 2015-07-14 | Disposition: A | Discharge status: Home / Self Care | Payer: Self-pay | Source: Ambulatory Visit | Attending: Interventional Cardiology | Admitting: Interventional Cardiology

## 2015-07-16 ENCOUNTER — Encounter (HOSPITAL_COMMUNITY): Payer: Self-pay

## 2015-07-18 ENCOUNTER — Encounter (HOSPITAL_COMMUNITY)
Admission: RE | Admit: 2015-07-18 | Discharge: 2015-07-18 | Disposition: A | Discharge status: Home / Self Care | Payer: Self-pay | Source: Ambulatory Visit | Attending: Interventional Cardiology | Admitting: Interventional Cardiology

## 2015-07-23 ENCOUNTER — Encounter (HOSPITAL_COMMUNITY): Payer: Self-pay

## 2015-07-25 ENCOUNTER — Encounter (HOSPITAL_COMMUNITY)
Admission: RE | Admit: 2015-07-25 | Discharge: 2015-07-25 | Disposition: A | Discharge status: Home / Self Care | Payer: Self-pay | Source: Ambulatory Visit | Attending: Interventional Cardiology | Admitting: Interventional Cardiology

## 2015-07-25 DIAGNOSIS — Z952 Presence of prosthetic heart valve: Secondary | ICD-10-CM | POA: Insufficient documentation

## 2015-07-28 ENCOUNTER — Encounter (HOSPITAL_COMMUNITY)
Admission: RE | Admit: 2015-07-28 | Discharge: 2015-07-28 | Disposition: A | Discharge status: Home / Self Care | Payer: Self-pay | Source: Ambulatory Visit | Attending: Interventional Cardiology | Admitting: Interventional Cardiology

## 2015-07-30 ENCOUNTER — Encounter (HOSPITAL_COMMUNITY)
Admission: RE | Admit: 2015-07-30 | Discharge: 2015-07-30 | Disposition: A | Discharge status: Home / Self Care | Payer: Self-pay | Source: Ambulatory Visit | Attending: Interventional Cardiology | Admitting: Interventional Cardiology

## 2015-08-01 ENCOUNTER — Encounter (HOSPITAL_COMMUNITY)
Admission: RE | Admit: 2015-08-01 | Discharge: 2015-08-01 | Disposition: A | Discharge status: Home / Self Care | Payer: Self-pay | Source: Ambulatory Visit | Attending: Interventional Cardiology | Admitting: Interventional Cardiology

## 2015-08-04 ENCOUNTER — Encounter (HOSPITAL_COMMUNITY)
Admission: RE | Admit: 2015-08-04 | Discharge: 2015-08-04 | Disposition: A | Discharge status: Home / Self Care | Payer: Self-pay | Source: Ambulatory Visit | Attending: Interventional Cardiology | Admitting: Interventional Cardiology

## 2015-08-06 ENCOUNTER — Encounter (HOSPITAL_COMMUNITY)
Admission: RE | Admit: 2015-08-06 | Discharge: 2015-08-06 | Disposition: A | Discharge status: Home / Self Care | Payer: Self-pay | Source: Ambulatory Visit | Attending: Interventional Cardiology | Admitting: Interventional Cardiology

## 2015-08-08 ENCOUNTER — Encounter (HOSPITAL_COMMUNITY)
Admission: RE | Admit: 2015-08-08 | Discharge: 2015-08-08 | Disposition: A | Discharge status: Home / Self Care | Payer: Self-pay | Source: Ambulatory Visit | Attending: Interventional Cardiology | Admitting: Interventional Cardiology

## 2015-08-11 ENCOUNTER — Encounter (HOSPITAL_COMMUNITY): Payer: Self-pay

## 2015-08-13 ENCOUNTER — Encounter (HOSPITAL_COMMUNITY)
Admission: RE | Admit: 2015-08-13 | Discharge: 2015-08-13 | Disposition: A | Discharge status: Home / Self Care | Payer: Self-pay | Source: Ambulatory Visit | Attending: Interventional Cardiology | Admitting: Interventional Cardiology

## 2015-08-15 ENCOUNTER — Encounter (HOSPITAL_COMMUNITY): Payer: Self-pay

## 2015-08-18 ENCOUNTER — Encounter (HOSPITAL_COMMUNITY)
Admission: RE | Admit: 2015-08-18 | Discharge: 2015-08-18 | Disposition: A | Discharge status: Home / Self Care | Payer: Self-pay | Source: Ambulatory Visit | Attending: Interventional Cardiology | Admitting: Interventional Cardiology

## 2015-08-20 ENCOUNTER — Encounter (HOSPITAL_COMMUNITY)
Admission: RE | Admit: 2015-08-20 | Discharge: 2015-08-20 | Disposition: A | Discharge status: Home / Self Care | Payer: Self-pay | Source: Ambulatory Visit | Attending: Interventional Cardiology | Admitting: Interventional Cardiology

## 2015-08-22 ENCOUNTER — Encounter (HOSPITAL_COMMUNITY)
Admission: RE | Admit: 2015-08-22 | Discharge: 2015-08-22 | Disposition: A | Discharge status: Home / Self Care | Payer: Medicare Other | Source: Ambulatory Visit | Attending: Interventional Cardiology | Admitting: Interventional Cardiology

## 2015-08-25 ENCOUNTER — Encounter (HOSPITAL_COMMUNITY)
Admission: RE | Admit: 2015-08-25 | Discharge: 2015-08-25 | Disposition: A | Discharge status: Home / Self Care | Payer: Self-pay | Source: Ambulatory Visit | Attending: Interventional Cardiology | Admitting: Interventional Cardiology

## 2015-08-25 DIAGNOSIS — Z952 Presence of prosthetic heart valve: Secondary | ICD-10-CM | POA: Insufficient documentation

## 2015-08-27 ENCOUNTER — Encounter (HOSPITAL_COMMUNITY)
Admission: RE | Admit: 2015-08-27 | Discharge: 2015-08-27 | Disposition: A | Discharge status: Home / Self Care | Payer: Self-pay | Source: Ambulatory Visit | Attending: Interventional Cardiology | Admitting: Interventional Cardiology

## 2015-08-29 ENCOUNTER — Encounter (HOSPITAL_COMMUNITY)
Admission: RE | Admit: 2015-08-29 | Discharge: 2015-08-29 | Disposition: A | Discharge status: Home / Self Care | Payer: Self-pay | Source: Ambulatory Visit | Attending: Interventional Cardiology | Admitting: Interventional Cardiology

## 2015-09-01 ENCOUNTER — Encounter (HOSPITAL_COMMUNITY)
Admission: RE | Admit: 2015-09-01 | Discharge: 2015-09-01 | Disposition: A | Discharge status: Home / Self Care | Payer: Self-pay | Source: Ambulatory Visit | Attending: Interventional Cardiology | Admitting: Interventional Cardiology

## 2015-09-03 ENCOUNTER — Encounter (HOSPITAL_COMMUNITY)
Admission: RE | Admit: 2015-09-03 | Discharge: 2015-09-03 | Disposition: A | Discharge status: Home / Self Care | Payer: Self-pay | Source: Ambulatory Visit | Attending: Interventional Cardiology | Admitting: Interventional Cardiology

## 2015-09-05 ENCOUNTER — Encounter (HOSPITAL_COMMUNITY)
Admission: RE | Admit: 2015-09-05 | Discharge: 2015-09-05 | Disposition: A | Discharge status: Home / Self Care | Payer: Self-pay | Source: Ambulatory Visit | Attending: Interventional Cardiology | Admitting: Interventional Cardiology

## 2015-09-08 ENCOUNTER — Encounter (HOSPITAL_COMMUNITY)
Admission: RE | Admit: 2015-09-08 | Discharge: 2015-09-08 | Disposition: A | Discharge status: Home / Self Care | Payer: Self-pay | Source: Ambulatory Visit | Attending: Interventional Cardiology | Admitting: Interventional Cardiology

## 2015-09-10 ENCOUNTER — Encounter (HOSPITAL_COMMUNITY)
Admission: RE | Admit: 2015-09-10 | Discharge: 2015-09-10 | Disposition: A | Discharge status: Home / Self Care | Payer: Self-pay | Source: Ambulatory Visit | Attending: Interventional Cardiology | Admitting: Interventional Cardiology

## 2015-09-12 ENCOUNTER — Encounter (HOSPITAL_COMMUNITY)
Admission: RE | Admit: 2015-09-12 | Discharge: 2015-09-12 | Disposition: A | Discharge status: Home / Self Care | Payer: Self-pay | Source: Ambulatory Visit | Attending: Interventional Cardiology | Admitting: Interventional Cardiology

## 2015-09-15 ENCOUNTER — Encounter (HOSPITAL_COMMUNITY)
Admission: RE | Admit: 2015-09-15 | Discharge: 2015-09-15 | Disposition: A | Discharge status: Home / Self Care | Payer: Self-pay | Source: Ambulatory Visit | Attending: Interventional Cardiology | Admitting: Interventional Cardiology

## 2015-09-17 ENCOUNTER — Encounter (HOSPITAL_COMMUNITY)
Admission: RE | Admit: 2015-09-17 | Discharge: 2015-09-17 | Disposition: A | Discharge status: Home / Self Care | Payer: Self-pay | Source: Ambulatory Visit | Attending: Interventional Cardiology | Admitting: Interventional Cardiology

## 2015-09-19 ENCOUNTER — Encounter (HOSPITAL_COMMUNITY)
Admission: RE | Admit: 2015-09-19 | Discharge: 2015-09-19 | Disposition: A | Discharge status: Home / Self Care | Payer: Self-pay | Source: Ambulatory Visit | Attending: Interventional Cardiology | Admitting: Interventional Cardiology

## 2015-09-22 ENCOUNTER — Encounter (HOSPITAL_COMMUNITY)
Admission: RE | Admit: 2015-09-22 | Discharge: 2015-09-22 | Disposition: A | Discharge status: Home / Self Care | Payer: Self-pay | Source: Ambulatory Visit | Attending: Interventional Cardiology | Admitting: Interventional Cardiology

## 2015-09-24 ENCOUNTER — Other Ambulatory Visit: Payer: Self-pay | Admitting: Interventional Cardiology

## 2015-09-24 ENCOUNTER — Encounter (HOSPITAL_COMMUNITY)
Admission: RE | Admit: 2015-09-24 | Discharge: 2015-09-24 | Disposition: A | Discharge status: Home / Self Care | Payer: Self-pay | Source: Ambulatory Visit | Attending: Interventional Cardiology | Admitting: Interventional Cardiology

## 2015-09-24 DIAGNOSIS — Z952 Presence of prosthetic heart valve: Secondary | ICD-10-CM | POA: Insufficient documentation

## 2015-09-26 ENCOUNTER — Encounter (HOSPITAL_COMMUNITY)
Admission: RE | Admit: 2015-09-26 | Discharge: 2015-09-26 | Disposition: A | Discharge status: Home / Self Care | Payer: Self-pay | Source: Ambulatory Visit | Attending: Interventional Cardiology | Admitting: Interventional Cardiology

## 2015-09-29 ENCOUNTER — Encounter (HOSPITAL_COMMUNITY)
Admission: RE | Admit: 2015-09-29 | Discharge: 2015-09-29 | Disposition: A | Discharge status: Home / Self Care | Payer: Self-pay | Source: Ambulatory Visit | Attending: Interventional Cardiology | Admitting: Interventional Cardiology

## 2015-10-01 ENCOUNTER — Encounter (HOSPITAL_COMMUNITY)
Admission: RE | Admit: 2015-10-01 | Discharge: 2015-10-01 | Disposition: A | Discharge status: Home / Self Care | Payer: Self-pay | Source: Ambulatory Visit | Attending: Interventional Cardiology | Admitting: Interventional Cardiology

## 2015-10-03 ENCOUNTER — Encounter (HOSPITAL_COMMUNITY)
Admission: RE | Admit: 2015-10-03 | Discharge: 2015-10-03 | Disposition: A | Discharge status: Home / Self Care | Payer: Self-pay | Source: Ambulatory Visit | Attending: Interventional Cardiology | Admitting: Interventional Cardiology

## 2015-10-06 ENCOUNTER — Encounter (HOSPITAL_COMMUNITY)
Admission: RE | Admit: 2015-10-06 | Discharge: 2015-10-06 | Disposition: A | Discharge status: Home / Self Care | Payer: Self-pay | Source: Ambulatory Visit | Attending: Interventional Cardiology | Admitting: Interventional Cardiology

## 2015-10-08 ENCOUNTER — Encounter (HOSPITAL_COMMUNITY): Payer: Self-pay

## 2015-10-10 ENCOUNTER — Encounter (HOSPITAL_COMMUNITY)
Admission: RE | Admit: 2015-10-10 | Discharge: 2015-10-10 | Disposition: A | Discharge status: Home / Self Care | Payer: Self-pay | Source: Ambulatory Visit | Attending: Interventional Cardiology | Admitting: Interventional Cardiology

## 2015-10-13 ENCOUNTER — Encounter (HOSPITAL_COMMUNITY)
Admission: RE | Admit: 2015-10-13 | Discharge: 2015-10-13 | Disposition: A | Discharge status: Home / Self Care | Payer: Self-pay | Source: Ambulatory Visit | Attending: Interventional Cardiology | Admitting: Interventional Cardiology

## 2015-10-15 ENCOUNTER — Encounter (HOSPITAL_COMMUNITY)
Admission: RE | Admit: 2015-10-15 | Discharge: 2015-10-15 | Disposition: A | Discharge status: Home / Self Care | Payer: Self-pay | Source: Ambulatory Visit | Attending: Interventional Cardiology | Admitting: Interventional Cardiology

## 2015-10-17 ENCOUNTER — Encounter (HOSPITAL_COMMUNITY): Payer: Self-pay

## 2015-10-20 ENCOUNTER — Encounter (HOSPITAL_COMMUNITY)
Admission: RE | Admit: 2015-10-20 | Discharge: 2015-10-20 | Disposition: A | Discharge status: Home / Self Care | Payer: Self-pay | Source: Ambulatory Visit | Attending: Interventional Cardiology | Admitting: Interventional Cardiology

## 2015-10-22 ENCOUNTER — Encounter (HOSPITAL_COMMUNITY)
Admission: RE | Admit: 2015-10-22 | Discharge: 2015-10-22 | Disposition: A | Discharge status: Home / Self Care | Payer: Self-pay | Source: Ambulatory Visit | Attending: Interventional Cardiology | Admitting: Interventional Cardiology

## 2015-10-24 ENCOUNTER — Encounter (HOSPITAL_COMMUNITY)
Admission: RE | Admit: 2015-10-24 | Discharge: 2015-10-24 | Disposition: A | Discharge status: Home / Self Care | Payer: Self-pay | Source: Ambulatory Visit | Attending: Interventional Cardiology | Admitting: Interventional Cardiology

## 2015-10-24 DIAGNOSIS — Z952 Presence of prosthetic heart valve: Secondary | ICD-10-CM | POA: Insufficient documentation

## 2015-10-29 ENCOUNTER — Encounter (HOSPITAL_COMMUNITY)
Admission: RE | Admit: 2015-10-29 | Discharge: 2015-10-29 | Disposition: A | Discharge status: Home / Self Care | Payer: Self-pay | Source: Ambulatory Visit | Attending: Interventional Cardiology | Admitting: Interventional Cardiology

## 2015-10-31 ENCOUNTER — Encounter (HOSPITAL_COMMUNITY)
Admission: RE | Admit: 2015-10-31 | Discharge: 2015-10-31 | Disposition: A | Discharge status: Home / Self Care | Payer: Self-pay | Source: Ambulatory Visit | Attending: Interventional Cardiology | Admitting: Interventional Cardiology

## 2015-11-03 ENCOUNTER — Encounter (HOSPITAL_COMMUNITY)
Admission: RE | Admit: 2015-11-03 | Discharge: 2015-11-03 | Disposition: A | Discharge status: Home / Self Care | Payer: Self-pay | Source: Ambulatory Visit | Attending: Interventional Cardiology | Admitting: Interventional Cardiology

## 2015-11-05 ENCOUNTER — Encounter (HOSPITAL_COMMUNITY)
Admission: RE | Admit: 2015-11-05 | Discharge: 2015-11-05 | Disposition: A | Discharge status: Home / Self Care | Payer: Self-pay | Source: Ambulatory Visit | Attending: Interventional Cardiology | Admitting: Interventional Cardiology

## 2015-11-07 ENCOUNTER — Encounter (HOSPITAL_COMMUNITY)
Admission: RE | Admit: 2015-11-07 | Discharge: 2015-11-07 | Disposition: A | Discharge status: Home / Self Care | Payer: Self-pay | Source: Ambulatory Visit | Attending: Interventional Cardiology | Admitting: Interventional Cardiology

## 2015-11-10 ENCOUNTER — Encounter (HOSPITAL_COMMUNITY)
Admission: RE | Admit: 2015-11-10 | Discharge: 2015-11-10 | Disposition: A | Discharge status: Home / Self Care | Payer: Self-pay | Source: Ambulatory Visit | Attending: Interventional Cardiology | Admitting: Interventional Cardiology

## 2015-11-12 ENCOUNTER — Encounter (HOSPITAL_COMMUNITY)
Admission: RE | Admit: 2015-11-12 | Discharge: 2015-11-12 | Disposition: A | Discharge status: Home / Self Care | Payer: Self-pay | Source: Ambulatory Visit | Attending: Interventional Cardiology | Admitting: Interventional Cardiology

## 2015-11-14 ENCOUNTER — Encounter (HOSPITAL_COMMUNITY)
Admission: RE | Admit: 2015-11-14 | Discharge: 2015-11-14 | Disposition: A | Discharge status: Home / Self Care | Payer: Self-pay | Source: Ambulatory Visit | Attending: Interventional Cardiology | Admitting: Interventional Cardiology

## 2015-11-17 ENCOUNTER — Encounter (HOSPITAL_COMMUNITY): Payer: Self-pay

## 2015-11-19 ENCOUNTER — Encounter (HOSPITAL_COMMUNITY)
Admission: RE | Admit: 2015-11-19 | Discharge: 2015-11-19 | Disposition: A | Discharge status: Home / Self Care | Payer: Self-pay | Source: Ambulatory Visit | Attending: Interventional Cardiology | Admitting: Interventional Cardiology

## 2015-11-21 ENCOUNTER — Encounter (HOSPITAL_COMMUNITY)
Admission: RE | Admit: 2015-11-21 | Discharge: 2015-11-21 | Disposition: A | Discharge status: Home / Self Care | Payer: Self-pay | Source: Ambulatory Visit | Attending: Interventional Cardiology | Admitting: Interventional Cardiology

## 2015-11-24 ENCOUNTER — Encounter (HOSPITAL_COMMUNITY)
Admission: RE | Admit: 2015-11-24 | Discharge: 2015-11-24 | Disposition: A | Discharge status: Home / Self Care | Payer: Self-pay | Source: Ambulatory Visit | Attending: Interventional Cardiology | Admitting: Interventional Cardiology

## 2015-11-24 DIAGNOSIS — Z952 Presence of prosthetic heart valve: Secondary | ICD-10-CM | POA: Insufficient documentation

## 2015-11-26 ENCOUNTER — Encounter (HOSPITAL_COMMUNITY)
Admission: RE | Admit: 2015-11-26 | Discharge: 2015-11-26 | Disposition: A | Discharge status: Home / Self Care | Payer: Self-pay | Source: Ambulatory Visit | Attending: Interventional Cardiology | Admitting: Interventional Cardiology

## 2015-11-28 ENCOUNTER — Encounter (HOSPITAL_COMMUNITY)
Admission: RE | Admit: 2015-11-28 | Discharge: 2015-11-28 | Disposition: A | Discharge status: Home / Self Care | Payer: Self-pay | Source: Ambulatory Visit | Attending: Interventional Cardiology | Admitting: Interventional Cardiology

## 2015-12-01 ENCOUNTER — Encounter (HOSPITAL_COMMUNITY)
Admission: RE | Admit: 2015-12-01 | Discharge: 2015-12-01 | Disposition: A | Discharge status: Home / Self Care | Payer: Self-pay | Source: Ambulatory Visit | Attending: Interventional Cardiology | Admitting: Interventional Cardiology

## 2015-12-03 ENCOUNTER — Encounter (HOSPITAL_COMMUNITY)
Admission: RE | Admit: 2015-12-03 | Discharge: 2015-12-03 | Disposition: A | Discharge status: Home / Self Care | Payer: Self-pay | Source: Ambulatory Visit | Attending: Interventional Cardiology | Admitting: Interventional Cardiology

## 2015-12-05 ENCOUNTER — Encounter (HOSPITAL_COMMUNITY)
Admission: RE | Admit: 2015-12-05 | Discharge: 2015-12-05 | Disposition: A | Discharge status: Home / Self Care | Payer: Self-pay | Source: Ambulatory Visit | Attending: Interventional Cardiology | Admitting: Interventional Cardiology

## 2015-12-08 ENCOUNTER — Encounter (HOSPITAL_COMMUNITY)
Admission: RE | Admit: 2015-12-08 | Discharge: 2015-12-08 | Disposition: A | Discharge status: Home / Self Care | Payer: Self-pay | Source: Ambulatory Visit | Attending: Interventional Cardiology | Admitting: Interventional Cardiology

## 2015-12-10 ENCOUNTER — Encounter (HOSPITAL_COMMUNITY)
Admission: RE | Admit: 2015-12-10 | Discharge: 2015-12-10 | Disposition: A | Discharge status: Home / Self Care | Payer: Self-pay | Source: Ambulatory Visit | Attending: Interventional Cardiology | Admitting: Interventional Cardiology

## 2015-12-11 ENCOUNTER — Other Ambulatory Visit: Payer: Self-pay | Admitting: *Deleted

## 2015-12-11 DIAGNOSIS — Z9889 Other specified postprocedural states: Secondary | ICD-10-CM

## 2015-12-12 ENCOUNTER — Encounter (HOSPITAL_COMMUNITY)
Admission: RE | Admit: 2015-12-12 | Discharge: 2015-12-12 | Disposition: A | Discharge status: Home / Self Care | Payer: Self-pay | Source: Ambulatory Visit | Attending: Interventional Cardiology | Admitting: Interventional Cardiology

## 2015-12-15 ENCOUNTER — Encounter (HOSPITAL_COMMUNITY)
Admission: RE | Admit: 2015-12-15 | Discharge: 2015-12-15 | Disposition: A | Discharge status: Home / Self Care | Payer: Self-pay | Source: Ambulatory Visit | Attending: Interventional Cardiology | Admitting: Interventional Cardiology

## 2015-12-17 ENCOUNTER — Encounter (HOSPITAL_COMMUNITY)
Admission: RE | Admit: 2015-12-17 | Discharge: 2015-12-17 | Disposition: A | Discharge status: Home / Self Care | Payer: Self-pay | Source: Ambulatory Visit | Attending: Interventional Cardiology | Admitting: Interventional Cardiology

## 2015-12-19 ENCOUNTER — Encounter (HOSPITAL_COMMUNITY)
Admission: RE | Admit: 2015-12-19 | Discharge: 2015-12-19 | Disposition: A | Discharge status: Home / Self Care | Payer: Self-pay | Source: Ambulatory Visit | Attending: Interventional Cardiology | Admitting: Interventional Cardiology

## 2015-12-22 ENCOUNTER — Encounter (HOSPITAL_COMMUNITY)
Admission: RE | Admit: 2015-12-22 | Discharge: 2015-12-22 | Disposition: A | Discharge status: Home / Self Care | Payer: Self-pay | Source: Ambulatory Visit | Attending: Interventional Cardiology | Admitting: Interventional Cardiology

## 2015-12-24 ENCOUNTER — Encounter (HOSPITAL_COMMUNITY)
Admission: RE | Admit: 2015-12-24 | Discharge: 2015-12-24 | Disposition: A | Discharge status: Home / Self Care | Payer: Self-pay | Source: Ambulatory Visit | Attending: Interventional Cardiology | Admitting: Interventional Cardiology

## 2015-12-24 DIAGNOSIS — Z952 Presence of prosthetic heart valve: Secondary | ICD-10-CM | POA: Insufficient documentation

## 2015-12-25 ENCOUNTER — Other Ambulatory Visit: Payer: Self-pay | Admitting: Interventional Cardiology

## 2015-12-26 ENCOUNTER — Encounter (HOSPITAL_COMMUNITY)
Admission: RE | Admit: 2015-12-26 | Discharge: 2015-12-26 | Disposition: A | Discharge status: Home / Self Care | Payer: Medicare Other | Source: Ambulatory Visit | Attending: Interventional Cardiology | Admitting: Interventional Cardiology

## 2015-12-29 ENCOUNTER — Encounter (HOSPITAL_COMMUNITY)
Admission: RE | Admit: 2015-12-29 | Discharge: 2015-12-29 | Disposition: A | Discharge status: Home / Self Care | Payer: Self-pay | Source: Ambulatory Visit | Attending: Interventional Cardiology | Admitting: Interventional Cardiology

## 2015-12-31 ENCOUNTER — Encounter (HOSPITAL_COMMUNITY)
Admission: RE | Admit: 2015-12-31 | Discharge: 2015-12-31 | Disposition: A | Discharge status: Home / Self Care | Payer: Self-pay | Source: Ambulatory Visit | Attending: Interventional Cardiology | Admitting: Interventional Cardiology

## 2016-01-02 ENCOUNTER — Encounter (HOSPITAL_COMMUNITY)
Admission: RE | Admit: 2016-01-02 | Discharge: 2016-01-02 | Disposition: A | Discharge status: Home / Self Care | Payer: Self-pay | Source: Ambulatory Visit | Attending: Interventional Cardiology | Admitting: Interventional Cardiology

## 2016-01-05 ENCOUNTER — Encounter (HOSPITAL_COMMUNITY)
Admission: RE | Admit: 2016-01-05 | Discharge: 2016-01-05 | Disposition: A | Discharge status: Home / Self Care | Payer: Self-pay | Source: Ambulatory Visit | Attending: Interventional Cardiology | Admitting: Interventional Cardiology

## 2016-01-07 ENCOUNTER — Encounter (HOSPITAL_COMMUNITY): Payer: Self-pay

## 2016-01-09 ENCOUNTER — Encounter (HOSPITAL_COMMUNITY)
Admission: RE | Admit: 2016-01-09 | Discharge: 2016-01-09 | Disposition: A | Discharge status: Home / Self Care | Payer: Self-pay | Source: Ambulatory Visit | Attending: Interventional Cardiology | Admitting: Interventional Cardiology

## 2016-01-12 ENCOUNTER — Encounter (HOSPITAL_COMMUNITY)
Admission: RE | Admit: 2016-01-12 | Discharge: 2016-01-12 | Disposition: A | Discharge status: Home / Self Care | Payer: Self-pay | Source: Ambulatory Visit | Attending: Interventional Cardiology | Admitting: Interventional Cardiology

## 2016-01-14 ENCOUNTER — Encounter (HOSPITAL_COMMUNITY)
Admission: RE | Admit: 2016-01-14 | Discharge: 2016-01-14 | Disposition: A | Discharge status: Home / Self Care | Payer: Self-pay | Source: Ambulatory Visit | Attending: Interventional Cardiology | Admitting: Interventional Cardiology

## 2016-01-19 ENCOUNTER — Encounter (HOSPITAL_COMMUNITY)
Admission: RE | Admit: 2016-01-19 | Discharge: 2016-01-19 | Disposition: A | Discharge status: Home / Self Care | Payer: Self-pay | Source: Ambulatory Visit | Attending: Interventional Cardiology | Admitting: Interventional Cardiology

## 2016-01-21 ENCOUNTER — Encounter (HOSPITAL_COMMUNITY)
Admission: RE | Admit: 2016-01-21 | Discharge: 2016-01-21 | Disposition: A | Discharge status: Home / Self Care | Payer: Self-pay | Source: Ambulatory Visit | Attending: Interventional Cardiology | Admitting: Interventional Cardiology

## 2016-01-23 ENCOUNTER — Encounter (HOSPITAL_COMMUNITY)
Admission: RE | Admit: 2016-01-23 | Discharge: 2016-01-23 | Disposition: A | Discharge status: Home / Self Care | Payer: Self-pay | Source: Ambulatory Visit | Attending: Interventional Cardiology | Admitting: Interventional Cardiology

## 2016-01-23 DIAGNOSIS — Z952 Presence of prosthetic heart valve: Secondary | ICD-10-CM | POA: Insufficient documentation

## 2016-01-26 ENCOUNTER — Encounter (HOSPITAL_COMMUNITY)
Admission: RE | Admit: 2016-01-26 | Discharge: 2016-01-26 | Disposition: A | Discharge status: Home / Self Care | Payer: Self-pay | Source: Ambulatory Visit | Attending: Interventional Cardiology | Admitting: Interventional Cardiology

## 2016-01-28 ENCOUNTER — Encounter (HOSPITAL_COMMUNITY)
Admission: RE | Admit: 2016-01-28 | Discharge: 2016-01-28 | Disposition: A | Discharge status: Home / Self Care | Payer: Self-pay | Source: Ambulatory Visit | Attending: Interventional Cardiology | Admitting: Interventional Cardiology

## 2016-01-30 ENCOUNTER — Encounter (HOSPITAL_COMMUNITY)
Admission: RE | Admit: 2016-01-30 | Discharge: 2016-01-30 | Disposition: A | Discharge status: Home / Self Care | Payer: Self-pay | Source: Ambulatory Visit | Attending: Interventional Cardiology | Admitting: Interventional Cardiology

## 2016-02-02 ENCOUNTER — Encounter (HOSPITAL_COMMUNITY): Payer: Self-pay

## 2016-02-04 ENCOUNTER — Encounter (HOSPITAL_COMMUNITY)
Admission: RE | Admit: 2016-02-04 | Discharge: 2016-02-04 | Disposition: A | Discharge status: Home / Self Care | Payer: Self-pay | Source: Ambulatory Visit | Attending: Interventional Cardiology | Admitting: Interventional Cardiology

## 2016-02-06 ENCOUNTER — Encounter (HOSPITAL_COMMUNITY)
Admission: RE | Admit: 2016-02-06 | Discharge: 2016-02-06 | Disposition: A | Discharge status: Home / Self Care | Payer: Self-pay | Source: Ambulatory Visit | Attending: Interventional Cardiology | Admitting: Interventional Cardiology

## 2016-02-09 ENCOUNTER — Encounter (HOSPITAL_COMMUNITY)
Admission: RE | Admit: 2016-02-09 | Discharge: 2016-02-09 | Disposition: A | Discharge status: Home / Self Care | Payer: Self-pay | Source: Ambulatory Visit | Attending: Interventional Cardiology | Admitting: Interventional Cardiology

## 2016-02-11 ENCOUNTER — Encounter (HOSPITAL_COMMUNITY)
Admission: RE | Admit: 2016-02-11 | Discharge: 2016-02-11 | Disposition: A | Discharge status: Home / Self Care | Payer: Self-pay | Source: Ambulatory Visit | Attending: Interventional Cardiology | Admitting: Interventional Cardiology

## 2016-02-13 ENCOUNTER — Encounter (HOSPITAL_COMMUNITY)
Admission: RE | Admit: 2016-02-13 | Discharge: 2016-02-13 | Disposition: A | Discharge status: Home / Self Care | Payer: Self-pay | Source: Ambulatory Visit | Attending: Interventional Cardiology | Admitting: Interventional Cardiology

## 2016-02-18 ENCOUNTER — Encounter (HOSPITAL_COMMUNITY)
Admission: RE | Admit: 2016-02-18 | Discharge: 2016-02-18 | Disposition: A | Discharge status: Home / Self Care | Payer: Self-pay | Source: Ambulatory Visit | Attending: Interventional Cardiology | Admitting: Interventional Cardiology

## 2016-02-20 ENCOUNTER — Encounter (HOSPITAL_COMMUNITY)
Admission: RE | Admit: 2016-02-20 | Discharge: 2016-02-20 | Disposition: A | Discharge status: Home / Self Care | Payer: Self-pay | Source: Ambulatory Visit | Attending: Interventional Cardiology | Admitting: Interventional Cardiology

## 2016-02-25 ENCOUNTER — Encounter (HOSPITAL_COMMUNITY)
Admission: RE | Admit: 2016-02-25 | Discharge: 2016-02-25 | Disposition: A | Discharge status: Home / Self Care | Payer: Self-pay | Source: Ambulatory Visit | Attending: Interventional Cardiology | Admitting: Interventional Cardiology

## 2016-02-25 DIAGNOSIS — Z952 Presence of prosthetic heart valve: Secondary | ICD-10-CM | POA: Insufficient documentation

## 2016-02-27 ENCOUNTER — Encounter (HOSPITAL_COMMUNITY)
Admission: RE | Admit: 2016-02-27 | Discharge: 2016-02-27 | Disposition: A | Discharge status: Home / Self Care | Payer: Self-pay | Source: Ambulatory Visit | Attending: Interventional Cardiology | Admitting: Interventional Cardiology

## 2016-03-01 ENCOUNTER — Encounter (HOSPITAL_COMMUNITY)
Admission: RE | Admit: 2016-03-01 | Discharge: 2016-03-01 | Disposition: A | Discharge status: Home / Self Care | Payer: Self-pay | Source: Ambulatory Visit | Attending: Interventional Cardiology | Admitting: Interventional Cardiology

## 2016-03-03 ENCOUNTER — Encounter (HOSPITAL_COMMUNITY)
Admission: RE | Admit: 2016-03-03 | Discharge: 2016-03-03 | Disposition: A | Discharge status: Home / Self Care | Payer: Self-pay | Source: Ambulatory Visit | Attending: Interventional Cardiology | Admitting: Interventional Cardiology

## 2016-03-05 ENCOUNTER — Encounter (HOSPITAL_COMMUNITY)
Admission: RE | Admit: 2016-03-05 | Discharge: 2016-03-05 | Disposition: A | Discharge status: Home / Self Care | Payer: Self-pay | Source: Ambulatory Visit | Attending: Interventional Cardiology | Admitting: Interventional Cardiology

## 2016-03-08 ENCOUNTER — Encounter (HOSPITAL_COMMUNITY)
Admission: RE | Admit: 2016-03-08 | Discharge: 2016-03-08 | Disposition: A | Discharge status: Home / Self Care | Payer: Self-pay | Source: Ambulatory Visit | Attending: Interventional Cardiology | Admitting: Interventional Cardiology

## 2016-03-10 ENCOUNTER — Encounter (HOSPITAL_COMMUNITY): Payer: Self-pay

## 2016-03-12 ENCOUNTER — Encounter (HOSPITAL_COMMUNITY): Payer: Self-pay

## 2016-03-15 ENCOUNTER — Encounter (HOSPITAL_COMMUNITY)
Admission: RE | Admit: 2016-03-15 | Discharge: 2016-03-15 | Disposition: A | Discharge status: Home / Self Care | Payer: Self-pay | Source: Ambulatory Visit | Attending: Interventional Cardiology | Admitting: Interventional Cardiology

## 2016-03-17 ENCOUNTER — Encounter (HOSPITAL_COMMUNITY)
Admission: RE | Admit: 2016-03-17 | Discharge: 2016-03-17 | Disposition: A | Discharge status: Home / Self Care | Payer: Self-pay | Source: Ambulatory Visit | Attending: Interventional Cardiology | Admitting: Interventional Cardiology

## 2016-03-19 ENCOUNTER — Encounter (HOSPITAL_COMMUNITY)
Admission: RE | Admit: 2016-03-19 | Discharge: 2016-03-19 | Disposition: A | Discharge status: Home / Self Care | Payer: Self-pay | Source: Ambulatory Visit | Attending: Interventional Cardiology | Admitting: Interventional Cardiology

## 2016-03-22 ENCOUNTER — Encounter (HOSPITAL_COMMUNITY)
Admission: RE | Admit: 2016-03-22 | Discharge: 2016-03-22 | Disposition: A | Discharge status: Home / Self Care | Payer: Self-pay | Source: Ambulatory Visit | Attending: Interventional Cardiology | Admitting: Interventional Cardiology

## 2016-03-24 ENCOUNTER — Encounter (HOSPITAL_COMMUNITY)
Admission: RE | Admit: 2016-03-24 | Discharge: 2016-03-24 | Disposition: A | Discharge status: Home / Self Care | Payer: Self-pay | Source: Ambulatory Visit | Attending: Interventional Cardiology | Admitting: Interventional Cardiology

## 2016-03-26 ENCOUNTER — Encounter (HOSPITAL_COMMUNITY)
Admission: RE | Admit: 2016-03-26 | Discharge: 2016-03-26 | Disposition: A | Discharge status: Home / Self Care | Payer: Self-pay | Source: Ambulatory Visit | Attending: Interventional Cardiology | Admitting: Interventional Cardiology

## 2016-03-26 DIAGNOSIS — Z952 Presence of prosthetic heart valve: Secondary | ICD-10-CM | POA: Insufficient documentation

## 2016-03-29 ENCOUNTER — Encounter (HOSPITAL_COMMUNITY)
Admission: RE | Admit: 2016-03-29 | Discharge: 2016-03-29 | Disposition: A | Discharge status: Home / Self Care | Payer: Self-pay | Source: Ambulatory Visit | Attending: Interventional Cardiology | Admitting: Interventional Cardiology

## 2016-03-30 ENCOUNTER — Encounter: Payer: Self-pay | Admitting: Interventional Cardiology

## 2016-03-30 ENCOUNTER — Ambulatory Visit (INDEPENDENT_AMBULATORY_CARE_PROVIDER_SITE_OTHER): Payer: Medicare HMO | Admitting: Interventional Cardiology

## 2016-03-30 VITALS — BP 112/68 | HR 69 | Ht 67.5 in | Wt 171.2 lb

## 2016-03-30 DIAGNOSIS — I10 Essential (primary) hypertension: Secondary | ICD-10-CM

## 2016-03-30 DIAGNOSIS — I2510 Atherosclerotic heart disease of native coronary artery without angina pectoris: Secondary | ICD-10-CM

## 2016-03-30 DIAGNOSIS — Z9889 Other specified postprocedural states: Secondary | ICD-10-CM | POA: Diagnosis not present

## 2016-03-30 NOTE — Patient Instructions (Signed)

## 2016-03-30 NOTE — Progress Notes (Signed)
Cardiology Office Note    Date:  03/30/2016   ID:  REGGIE Tanner, DOB 1950-08-20, MRN [id number]  PCP:  Henrine Screws, MD  Cardiologist: Sinclair Grooms, MD   Chief Complaint  Patient presents with  . Cardiac Valve Problem    History of Present Illness:  Jerry Tanner is a 66 y.o. male with history of mitral valve repair 2011, moderate coronary disease by pre-surgical catheterization, essential hypertension, and hyperlipidemia.  Mitral valve repair in 2011 has been stable and he denies cardiac complaints. He is still in the maintenance cardiac rehabilitation program. He has no exertional intolerance or symptoms of angina. He did have a 40-60% LAD stenosis at the time of mitral valve repair that was not critical enough to require therapy.  Lipids are being managed by primary care. We discussed target LDL of 70.  Past Medical History:  Diagnosis Date  . Atherosclerosis of native coronary artery of native heart without angina pectoris 02/01/2013  . H/O mitral valve repair   . Heart disease     Past Surgical History:  Procedure Laterality Date  . MITRAL VALVE REPAIR    . TONSILLECTOMY      Current Medications: Outpatient Medications Prior to Visit  Medication Sig Dispense Refill  . citalopram (CELEXA) 40 MG tablet Take 20 mg by mouth daily.     . metoprolol succinate (TOPROL-XL) 25 MG 24 hr tablet TAKE ONE TABLET BY MOUTH DAILY 90 tablet 0  . simvastatin (ZOCOR) 20 MG tablet Take 20 mg by mouth daily.      No facility-administered medications prior to visit.      Allergies:   Patient has no known allergies.   Social History   Social History  . Marital status: Married    Spouse name: N/A  . Number of children: N/A  . Years of education: N/A   Social History Main Topics  . Smoking status: Never Smoker  . Smokeless tobacco: Never Used  . Alcohol use No  . Drug use: No  . Sexual activity: Not Asked   Other Topics Concern  . None   Social History Narrative    . None     Family History:  The patient's family history includes Breast cancer in his mother; Heart Problems in his father and mother.   ROS:   Please see the history of present illness.    Occasional low back discomfort. Otherwise no complaints. No medication side effects.  All other systems reviewed and are negative.   PHYSICAL EXAM:   VS:  BP 112/68 (BP Location: Right Arm)   Pulse 69   Ht 5' 7.5" (1.715 m)   Wt 171 lb 3.2 oz (77.7 kg)   BMI 26.42 kg/m    GEN: Well nourished, well developed, in no acute distress  HEENT: normal  Neck: no JVD, carotid bruits, or masses Cardiac: RRR; no murmurs, rubs, or gallops,no edema  Respiratory:  clear to auscultation bilaterally, normal work of breathing GI: soft, nontender, nondistended, + BS MS: no deformity or atrophy  Skin: warm and dry, no rash Neuro:  Alert and Oriented x 3, Strength and sensation are intact Psych: euthymic mood, full affect  Wt Readings from Last 3 Encounters:  03/30/16 171 lb 3.2 oz (77.7 kg)  04/09/15 167 lb 12.8 oz (76.1 kg)  03/14/14 166 lb (75.3 kg)      Studies/Labs Reviewed:   EKG:  EKG  Axis deviation/left anterior hemiblock, nonspecific ST-T abnormality, no change compared to  prior tracings.vii.  Recent Labs: No results found for requested labs within last 8760 hours.   Lipid Panel No results found for: CHOL, TRIG, HDL, CHOLHDL, VLDL, LDLCALC, LDLDIRECT  Additional studies/ records that were reviewed today include:  Reviewed prior data. None has been performed recently.    ASSESSMENT:    1. Atherosclerosis of native coronary artery of native heart without angina pectoris   2. Essential hypertension, benign   3. Hx of mitral valve repair      PLAN:  In order of problems listed above:  1. Primary risk factor modification with aggressive lipid management and antiplatelet therapy. He is asymptomatic. He is active. No functional testing is needed. 2. Excellent control of minimal  systolic hypertension noted previously. 3. No evidence of mitral valve dysfunction/murmur now 7 years post repair.  No functional testing or further evaluation is needed currently. Clinical follow-up in one year.    Medication Adjustments/Labs and Tests Ordered: Current medicines are reviewed at length with the patient today.  Concerns regarding medicines are outlined above.  Medication changes, Labs and Tests ordered today are listed in the Patient Instructions below. There are no Patient Instructions on file for this visit.   Signed, Sinclair Grooms, MD  03/30/2016 3:13 PM    [facility] [place], [place], [place]  [address] Phone: [phone]; Fax: [phone]

## 2016-03-31 ENCOUNTER — Encounter (HOSPITAL_COMMUNITY)
Admission: RE | Admit: 2016-03-31 | Discharge: 2016-03-31 | Disposition: A | Discharge status: Home / Self Care | Payer: Self-pay | Source: Ambulatory Visit | Attending: Interventional Cardiology | Admitting: Interventional Cardiology

## 2016-04-02 ENCOUNTER — Encounter (HOSPITAL_COMMUNITY)
Admission: RE | Admit: 2016-04-02 | Discharge: 2016-04-02 | Disposition: A | Discharge status: Home / Self Care | Payer: Self-pay | Source: Ambulatory Visit | Attending: Interventional Cardiology | Admitting: Interventional Cardiology

## 2016-04-05 ENCOUNTER — Encounter (HOSPITAL_COMMUNITY)
Admission: RE | Admit: 2016-04-05 | Discharge: 2016-04-05 | Disposition: A | Discharge status: Home / Self Care | Payer: Self-pay | Source: Ambulatory Visit | Attending: Interventional Cardiology | Admitting: Interventional Cardiology

## 2016-04-07 ENCOUNTER — Encounter (HOSPITAL_COMMUNITY)
Admission: RE | Admit: 2016-04-07 | Discharge: 2016-04-07 | Disposition: A | Discharge status: Home / Self Care | Payer: Self-pay | Source: Ambulatory Visit | Attending: Interventional Cardiology | Admitting: Interventional Cardiology

## 2016-04-09 ENCOUNTER — Encounter (HOSPITAL_COMMUNITY)
Admission: RE | Admit: 2016-04-09 | Discharge: 2016-04-09 | Disposition: A | Discharge status: Home / Self Care | Payer: Self-pay | Source: Ambulatory Visit | Attending: Interventional Cardiology | Admitting: Interventional Cardiology

## 2016-04-12 ENCOUNTER — Encounter (HOSPITAL_COMMUNITY)
Admission: RE | Admit: 2016-04-12 | Discharge: 2016-04-12 | Disposition: A | Discharge status: Home / Self Care | Payer: Self-pay | Source: Ambulatory Visit | Attending: Interventional Cardiology | Admitting: Interventional Cardiology

## 2016-04-14 ENCOUNTER — Encounter (HOSPITAL_COMMUNITY)
Admission: RE | Admit: 2016-04-14 | Discharge: 2016-04-14 | Disposition: A | Discharge status: Home / Self Care | Payer: Self-pay | Source: Ambulatory Visit | Attending: Interventional Cardiology | Admitting: Interventional Cardiology

## 2016-04-16 ENCOUNTER — Encounter (HOSPITAL_COMMUNITY)
Admission: RE | Admit: 2016-04-16 | Discharge: 2016-04-16 | Disposition: A | Discharge status: Home / Self Care | Payer: Self-pay | Source: Ambulatory Visit | Attending: Interventional Cardiology | Admitting: Interventional Cardiology

## 2016-04-19 ENCOUNTER — Encounter (HOSPITAL_COMMUNITY): Payer: Self-pay

## 2016-04-21 ENCOUNTER — Encounter (HOSPITAL_COMMUNITY): Payer: Self-pay

## 2016-04-23 ENCOUNTER — Encounter (HOSPITAL_COMMUNITY)
Admission: RE | Admit: 2016-04-23 | Discharge: 2016-04-23 | Disposition: A | Discharge status: Home / Self Care | Payer: Self-pay | Source: Ambulatory Visit | Attending: Interventional Cardiology | Admitting: Interventional Cardiology

## 2016-04-23 DIAGNOSIS — Z952 Presence of prosthetic heart valve: Secondary | ICD-10-CM | POA: Insufficient documentation

## 2016-04-26 ENCOUNTER — Encounter (HOSPITAL_COMMUNITY)
Admission: RE | Admit: 2016-04-26 | Discharge: 2016-04-26 | Disposition: A | Discharge status: Home / Self Care | Payer: Self-pay | Source: Ambulatory Visit | Attending: Interventional Cardiology | Admitting: Interventional Cardiology

## 2016-04-27 DIAGNOSIS — R69 Illness, unspecified: Secondary | ICD-10-CM | POA: Diagnosis not present

## 2016-04-28 ENCOUNTER — Encounter (HOSPITAL_COMMUNITY)
Admission: RE | Admit: 2016-04-28 | Discharge: 2016-04-28 | Disposition: A | Discharge status: Home / Self Care | Payer: Self-pay | Source: Ambulatory Visit | Attending: Interventional Cardiology | Admitting: Interventional Cardiology

## 2016-04-30 ENCOUNTER — Encounter (HOSPITAL_COMMUNITY)
Admission: RE | Admit: 2016-04-30 | Discharge: 2016-04-30 | Disposition: A | Discharge status: Home / Self Care | Payer: Self-pay | Source: Ambulatory Visit | Attending: Interventional Cardiology | Admitting: Interventional Cardiology

## 2016-05-03 ENCOUNTER — Encounter (HOSPITAL_COMMUNITY): Payer: Self-pay

## 2016-05-05 ENCOUNTER — Encounter (HOSPITAL_COMMUNITY)
Admission: RE | Admit: 2016-05-05 | Discharge: 2016-05-05 | Disposition: A | Discharge status: Home / Self Care | Payer: Self-pay | Source: Ambulatory Visit | Attending: Interventional Cardiology | Admitting: Interventional Cardiology

## 2016-05-07 ENCOUNTER — Encounter (HOSPITAL_COMMUNITY)
Admission: RE | Admit: 2016-05-07 | Discharge: 2016-05-07 | Disposition: A | Discharge status: Home / Self Care | Payer: Self-pay | Source: Ambulatory Visit | Attending: Interventional Cardiology | Admitting: Interventional Cardiology

## 2016-05-10 ENCOUNTER — Encounter (HOSPITAL_COMMUNITY)
Admission: RE | Admit: 2016-05-10 | Discharge: 2016-05-10 | Disposition: A | Discharge status: Home / Self Care | Payer: Self-pay | Source: Ambulatory Visit | Attending: Interventional Cardiology | Admitting: Interventional Cardiology

## 2016-05-11 DIAGNOSIS — Z1211 Encounter for screening for malignant neoplasm of colon: Secondary | ICD-10-CM | POA: Diagnosis not present

## 2016-05-11 DIAGNOSIS — Z1212 Encounter for screening for malignant neoplasm of rectum: Secondary | ICD-10-CM | POA: Diagnosis not present

## 2016-05-12 ENCOUNTER — Encounter (HOSPITAL_COMMUNITY)
Admission: RE | Admit: 2016-05-12 | Discharge: 2016-05-12 | Disposition: A | Discharge status: Home / Self Care | Payer: Self-pay | Source: Ambulatory Visit | Attending: Interventional Cardiology | Admitting: Interventional Cardiology

## 2016-05-14 ENCOUNTER — Encounter (HOSPITAL_COMMUNITY)
Admission: RE | Admit: 2016-05-14 | Discharge: 2016-05-14 | Disposition: A | Discharge status: Home / Self Care | Payer: Self-pay | Source: Ambulatory Visit | Attending: Interventional Cardiology | Admitting: Interventional Cardiology

## 2016-05-17 ENCOUNTER — Encounter (HOSPITAL_COMMUNITY)
Admission: RE | Admit: 2016-05-17 | Discharge: 2016-05-17 | Disposition: A | Discharge status: Home / Self Care | Payer: Self-pay | Source: Ambulatory Visit | Attending: Interventional Cardiology | Admitting: Interventional Cardiology

## 2016-05-19 ENCOUNTER — Encounter (HOSPITAL_COMMUNITY)
Admission: RE | Admit: 2016-05-19 | Discharge: 2016-05-19 | Disposition: A | Discharge status: Home / Self Care | Payer: Self-pay | Source: Ambulatory Visit | Attending: Interventional Cardiology | Admitting: Interventional Cardiology

## 2016-05-21 ENCOUNTER — Encounter (HOSPITAL_COMMUNITY)
Admission: RE | Admit: 2016-05-21 | Discharge: 2016-05-21 | Disposition: A | Discharge status: Home / Self Care | Payer: Self-pay | Source: Ambulatory Visit | Attending: Interventional Cardiology | Admitting: Interventional Cardiology

## 2016-05-24 ENCOUNTER — Encounter (HOSPITAL_COMMUNITY)
Admission: RE | Admit: 2016-05-24 | Discharge: 2016-05-24 | Disposition: A | Discharge status: Home / Self Care | Payer: Self-pay | Source: Ambulatory Visit | Attending: Interventional Cardiology | Admitting: Interventional Cardiology

## 2016-05-24 DIAGNOSIS — Z952 Presence of prosthetic heart valve: Secondary | ICD-10-CM | POA: Insufficient documentation

## 2016-05-26 ENCOUNTER — Encounter (HOSPITAL_COMMUNITY)
Admission: RE | Admit: 2016-05-26 | Discharge: 2016-05-26 | Disposition: A | Discharge status: Home / Self Care | Payer: Self-pay | Source: Ambulatory Visit | Attending: Interventional Cardiology | Admitting: Interventional Cardiology

## 2016-05-28 ENCOUNTER — Encounter (HOSPITAL_COMMUNITY)
Admission: RE | Admit: 2016-05-28 | Discharge: 2016-05-28 | Disposition: A | Discharge status: Home / Self Care | Payer: Self-pay | Source: Ambulatory Visit | Attending: Interventional Cardiology | Admitting: Interventional Cardiology

## 2016-05-31 ENCOUNTER — Encounter (HOSPITAL_COMMUNITY)
Admission: RE | Admit: 2016-05-31 | Discharge: 2016-05-31 | Disposition: A | Discharge status: Home / Self Care | Payer: Self-pay | Source: Ambulatory Visit | Attending: Interventional Cardiology | Admitting: Interventional Cardiology

## 2016-06-02 ENCOUNTER — Encounter (HOSPITAL_COMMUNITY)
Admission: RE | Admit: 2016-06-02 | Discharge: 2016-06-02 | Disposition: A | Discharge status: Home / Self Care | Payer: Self-pay | Source: Ambulatory Visit | Attending: Interventional Cardiology | Admitting: Interventional Cardiology

## 2016-06-04 ENCOUNTER — Encounter (HOSPITAL_COMMUNITY)
Admission: RE | Admit: 2016-06-04 | Discharge: 2016-06-04 | Disposition: A | Discharge status: Home / Self Care | Payer: Self-pay | Source: Ambulatory Visit | Attending: Interventional Cardiology | Admitting: Interventional Cardiology

## 2016-06-07 ENCOUNTER — Encounter (HOSPITAL_COMMUNITY)
Admission: RE | Admit: 2016-06-07 | Discharge: 2016-06-07 | Disposition: A | Discharge status: Home / Self Care | Payer: Self-pay | Source: Ambulatory Visit | Attending: Interventional Cardiology | Admitting: Interventional Cardiology

## 2016-06-09 ENCOUNTER — Encounter (HOSPITAL_COMMUNITY)
Admission: RE | Admit: 2016-06-09 | Discharge: 2016-06-09 | Disposition: A | Discharge status: Home / Self Care | Payer: Self-pay | Source: Ambulatory Visit | Attending: Interventional Cardiology | Admitting: Interventional Cardiology

## 2016-06-11 ENCOUNTER — Encounter (HOSPITAL_COMMUNITY)
Admission: RE | Admit: 2016-06-11 | Discharge: 2016-06-11 | Disposition: A | Discharge status: Home / Self Care | Payer: Self-pay | Source: Ambulatory Visit | Attending: Interventional Cardiology | Admitting: Interventional Cardiology

## 2016-06-14 ENCOUNTER — Encounter (HOSPITAL_COMMUNITY)
Admission: RE | Admit: 2016-06-14 | Discharge: 2016-06-14 | Disposition: A | Discharge status: Home / Self Care | Payer: Self-pay | Source: Ambulatory Visit | Attending: Interventional Cardiology | Admitting: Interventional Cardiology

## 2016-06-16 ENCOUNTER — Encounter (HOSPITAL_COMMUNITY)
Admission: RE | Admit: 2016-06-16 | Discharge: 2016-06-16 | Disposition: A | Discharge status: Home / Self Care | Payer: Self-pay | Source: Ambulatory Visit | Attending: Interventional Cardiology | Admitting: Interventional Cardiology

## 2016-06-17 ENCOUNTER — Other Ambulatory Visit: Payer: Self-pay | Admitting: Interventional Cardiology

## 2016-06-17 DIAGNOSIS — Z79899 Other long term (current) drug therapy: Secondary | ICD-10-CM | POA: Diagnosis not present

## 2016-06-17 DIAGNOSIS — Z0000 Encounter for general adult medical examination without abnormal findings: Secondary | ICD-10-CM | POA: Diagnosis not present

## 2016-06-17 DIAGNOSIS — M545 Low back pain: Secondary | ICD-10-CM | POA: Diagnosis not present

## 2016-06-17 DIAGNOSIS — Q239 Congenital malformation of aortic and mitral valves, unspecified: Secondary | ICD-10-CM | POA: Diagnosis not present

## 2016-06-17 DIAGNOSIS — E785 Hyperlipidemia, unspecified: Secondary | ICD-10-CM | POA: Diagnosis not present

## 2016-06-17 DIAGNOSIS — Z973 Presence of spectacles and contact lenses: Secondary | ICD-10-CM | POA: Diagnosis not present

## 2016-06-17 DIAGNOSIS — H259 Unspecified age-related cataract: Secondary | ICD-10-CM | POA: Diagnosis not present

## 2016-06-17 DIAGNOSIS — R69 Illness, unspecified: Secondary | ICD-10-CM | POA: Diagnosis not present

## 2016-06-17 DIAGNOSIS — Z7982 Long term (current) use of aspirin: Secondary | ICD-10-CM | POA: Diagnosis not present

## 2016-06-18 ENCOUNTER — Encounter (HOSPITAL_COMMUNITY)
Admission: RE | Admit: 2016-06-18 | Discharge: 2016-06-18 | Disposition: A | Discharge status: Home / Self Care | Payer: Self-pay | Source: Ambulatory Visit | Attending: Interventional Cardiology | Admitting: Interventional Cardiology

## 2016-06-21 ENCOUNTER — Encounter (HOSPITAL_COMMUNITY)
Admission: RE | Admit: 2016-06-21 | Discharge: 2016-06-21 | Disposition: A | Discharge status: Home / Self Care | Payer: Self-pay | Source: Ambulatory Visit | Attending: Interventional Cardiology | Admitting: Interventional Cardiology

## 2016-06-23 ENCOUNTER — Encounter (HOSPITAL_COMMUNITY)
Admission: RE | Admit: 2016-06-23 | Discharge: 2016-06-23 | Disposition: A | Discharge status: Home / Self Care | Payer: Self-pay | Source: Ambulatory Visit | Attending: Interventional Cardiology | Admitting: Interventional Cardiology

## 2016-06-23 DIAGNOSIS — Z9889 Other specified postprocedural states: Secondary | ICD-10-CM | POA: Insufficient documentation

## 2016-06-25 ENCOUNTER — Encounter (HOSPITAL_COMMUNITY)
Admission: RE | Admit: 2016-06-25 | Discharge: 2016-06-25 | Disposition: A | Discharge status: Home / Self Care | Payer: Self-pay | Source: Ambulatory Visit | Attending: Interventional Cardiology | Admitting: Interventional Cardiology

## 2016-06-28 ENCOUNTER — Encounter (HOSPITAL_COMMUNITY)
Admission: RE | Admit: 2016-06-28 | Discharge: 2016-06-28 | Disposition: A | Discharge status: Home / Self Care | Payer: Self-pay | Source: Ambulatory Visit | Attending: Interventional Cardiology | Admitting: Interventional Cardiology

## 2016-06-30 ENCOUNTER — Encounter (HOSPITAL_COMMUNITY)
Admission: RE | Admit: 2016-06-30 | Discharge: 2016-06-30 | Disposition: A | Discharge status: Home / Self Care | Payer: Self-pay | Source: Ambulatory Visit | Attending: Interventional Cardiology | Admitting: Interventional Cardiology

## 2016-07-02 ENCOUNTER — Encounter (HOSPITAL_COMMUNITY)
Admission: RE | Admit: 2016-07-02 | Discharge: 2016-07-02 | Disposition: A | Discharge status: Home / Self Care | Payer: Self-pay | Source: Ambulatory Visit | Attending: Interventional Cardiology | Admitting: Interventional Cardiology

## 2016-07-05 ENCOUNTER — Encounter (HOSPITAL_COMMUNITY)
Admission: RE | Admit: 2016-07-05 | Discharge: 2016-07-05 | Disposition: A | Discharge status: Home / Self Care | Payer: Self-pay | Source: Ambulatory Visit | Attending: Interventional Cardiology | Admitting: Interventional Cardiology

## 2016-07-07 ENCOUNTER — Encounter (HOSPITAL_COMMUNITY)
Admission: RE | Admit: 2016-07-07 | Discharge: 2016-07-07 | Disposition: A | Discharge status: Home / Self Care | Payer: Self-pay | Source: Ambulatory Visit | Attending: Interventional Cardiology | Admitting: Interventional Cardiology

## 2016-07-09 ENCOUNTER — Encounter (HOSPITAL_COMMUNITY)
Admission: RE | Admit: 2016-07-09 | Discharge: 2016-07-09 | Disposition: A | Discharge status: Home / Self Care | Payer: Self-pay | Source: Ambulatory Visit | Attending: Interventional Cardiology | Admitting: Interventional Cardiology

## 2016-07-12 ENCOUNTER — Encounter (HOSPITAL_COMMUNITY)
Admission: RE | Admit: 2016-07-12 | Discharge: 2016-07-12 | Disposition: A | Discharge status: Home / Self Care | Payer: Self-pay | Source: Ambulatory Visit | Attending: Interventional Cardiology | Admitting: Interventional Cardiology

## 2016-07-14 ENCOUNTER — Encounter (HOSPITAL_COMMUNITY)
Admission: RE | Admit: 2016-07-14 | Discharge: 2016-07-14 | Disposition: A | Discharge status: Home / Self Care | Payer: Self-pay | Source: Ambulatory Visit | Attending: Interventional Cardiology | Admitting: Interventional Cardiology

## 2016-07-16 ENCOUNTER — Encounter (HOSPITAL_COMMUNITY)
Admission: RE | Admit: 2016-07-16 | Discharge: 2016-07-16 | Disposition: A | Discharge status: Home / Self Care | Payer: Self-pay | Source: Ambulatory Visit | Attending: Interventional Cardiology | Admitting: Interventional Cardiology

## 2016-07-21 ENCOUNTER — Encounter (HOSPITAL_COMMUNITY)
Admission: RE | Admit: 2016-07-21 | Discharge: 2016-07-21 | Disposition: A | Discharge status: Home / Self Care | Payer: Self-pay | Source: Ambulatory Visit | Attending: Interventional Cardiology | Admitting: Interventional Cardiology

## 2016-07-23 ENCOUNTER — Encounter (HOSPITAL_COMMUNITY)
Admission: RE | Admit: 2016-07-23 | Discharge: 2016-07-23 | Disposition: A | Discharge status: Home / Self Care | Payer: Self-pay | Source: Ambulatory Visit | Attending: Interventional Cardiology | Admitting: Interventional Cardiology

## 2016-07-23 DIAGNOSIS — Z9889 Other specified postprocedural states: Secondary | ICD-10-CM | POA: Insufficient documentation

## 2016-07-26 ENCOUNTER — Encounter (HOSPITAL_COMMUNITY)
Admission: RE | Admit: 2016-07-26 | Discharge: 2016-07-26 | Disposition: A | Discharge status: Home / Self Care | Payer: Self-pay | Source: Ambulatory Visit | Attending: Interventional Cardiology | Admitting: Interventional Cardiology

## 2016-07-28 ENCOUNTER — Encounter (HOSPITAL_COMMUNITY)
Admission: RE | Admit: 2016-07-28 | Discharge: 2016-07-28 | Disposition: A | Discharge status: Home / Self Care | Payer: Self-pay | Source: Ambulatory Visit | Attending: Interventional Cardiology | Admitting: Interventional Cardiology

## 2016-07-30 ENCOUNTER — Encounter (HOSPITAL_COMMUNITY)
Admission: RE | Admit: 2016-07-30 | Discharge: 2016-07-30 | Disposition: A | Discharge status: Home / Self Care | Payer: Self-pay | Source: Ambulatory Visit | Attending: Interventional Cardiology | Admitting: Interventional Cardiology

## 2016-08-02 ENCOUNTER — Encounter (HOSPITAL_COMMUNITY)
Admission: RE | Admit: 2016-08-02 | Discharge: 2016-08-02 | Disposition: A | Discharge status: Home / Self Care | Payer: Self-pay | Source: Ambulatory Visit | Attending: Interventional Cardiology | Admitting: Interventional Cardiology

## 2016-08-04 ENCOUNTER — Encounter (HOSPITAL_COMMUNITY)
Admission: RE | Admit: 2016-08-04 | Discharge: 2016-08-04 | Disposition: A | Discharge status: Home / Self Care | Payer: Self-pay | Source: Ambulatory Visit | Attending: Interventional Cardiology | Admitting: Interventional Cardiology

## 2016-08-06 ENCOUNTER — Encounter (HOSPITAL_COMMUNITY)
Admission: RE | Admit: 2016-08-06 | Discharge: 2016-08-06 | Disposition: A | Discharge status: Home / Self Care | Payer: Self-pay | Source: Ambulatory Visit | Attending: Interventional Cardiology | Admitting: Interventional Cardiology

## 2016-08-09 ENCOUNTER — Encounter (HOSPITAL_COMMUNITY)
Admission: RE | Admit: 2016-08-09 | Discharge: 2016-08-09 | Disposition: A | Discharge status: Home / Self Care | Payer: Self-pay | Source: Ambulatory Visit | Attending: Interventional Cardiology | Admitting: Interventional Cardiology

## 2016-08-11 ENCOUNTER — Encounter (HOSPITAL_COMMUNITY)
Admission: RE | Admit: 2016-08-11 | Discharge: 2016-08-11 | Disposition: A | Discharge status: Home / Self Care | Payer: Self-pay | Source: Ambulatory Visit | Attending: Interventional Cardiology | Admitting: Interventional Cardiology

## 2016-08-13 ENCOUNTER — Encounter (HOSPITAL_COMMUNITY)
Admission: RE | Admit: 2016-08-13 | Discharge: 2016-08-13 | Disposition: A | Discharge status: Home / Self Care | Payer: Self-pay | Source: Ambulatory Visit | Attending: Interventional Cardiology | Admitting: Interventional Cardiology

## 2016-08-16 ENCOUNTER — Encounter (HOSPITAL_COMMUNITY)
Admission: RE | Admit: 2016-08-16 | Discharge: 2016-08-16 | Disposition: A | Discharge status: Home / Self Care | Payer: Self-pay | Source: Ambulatory Visit | Attending: Interventional Cardiology | Admitting: Interventional Cardiology

## 2016-08-18 ENCOUNTER — Encounter (HOSPITAL_COMMUNITY)
Admission: RE | Admit: 2016-08-18 | Discharge: 2016-08-18 | Disposition: A | Discharge status: Home / Self Care | Payer: Self-pay | Source: Ambulatory Visit | Attending: Interventional Cardiology | Admitting: Interventional Cardiology

## 2016-08-20 ENCOUNTER — Encounter (HOSPITAL_COMMUNITY)
Admission: RE | Admit: 2016-08-20 | Discharge: 2016-08-20 | Disposition: A | Discharge status: Home / Self Care | Payer: Self-pay | Source: Ambulatory Visit | Attending: Interventional Cardiology | Admitting: Interventional Cardiology

## 2016-08-23 ENCOUNTER — Encounter (HOSPITAL_COMMUNITY)
Admission: RE | Admit: 2016-08-23 | Discharge: 2016-08-23 | Disposition: A | Discharge status: Home / Self Care | Payer: Self-pay | Source: Ambulatory Visit | Attending: Interventional Cardiology | Admitting: Interventional Cardiology

## 2016-08-23 DIAGNOSIS — Z9889 Other specified postprocedural states: Secondary | ICD-10-CM | POA: Insufficient documentation

## 2016-08-27 ENCOUNTER — Encounter (HOSPITAL_COMMUNITY)
Admission: RE | Admit: 2016-08-27 | Discharge: 2016-08-27 | Disposition: A | Discharge status: Home / Self Care | Payer: Self-pay | Source: Ambulatory Visit | Attending: Interventional Cardiology | Admitting: Interventional Cardiology

## 2016-08-30 ENCOUNTER — Encounter (HOSPITAL_COMMUNITY)
Admission: RE | Admit: 2016-08-30 | Discharge: 2016-08-30 | Disposition: A | Discharge status: Home / Self Care | Payer: Self-pay | Source: Ambulatory Visit | Attending: Interventional Cardiology | Admitting: Interventional Cardiology

## 2016-09-01 ENCOUNTER — Encounter (HOSPITAL_COMMUNITY)
Admission: RE | Admit: 2016-09-01 | Discharge: 2016-09-01 | Disposition: A | Discharge status: Home / Self Care | Payer: Self-pay | Source: Ambulatory Visit | Attending: Interventional Cardiology | Admitting: Interventional Cardiology

## 2016-09-02 DIAGNOSIS — R69 Illness, unspecified: Secondary | ICD-10-CM | POA: Diagnosis not present

## 2016-09-03 ENCOUNTER — Encounter (HOSPITAL_COMMUNITY)
Admission: RE | Admit: 2016-09-03 | Discharge: 2016-09-03 | Disposition: A | Discharge status: Home / Self Care | Payer: Self-pay | Source: Ambulatory Visit | Attending: Interventional Cardiology | Admitting: Interventional Cardiology

## 2016-09-06 ENCOUNTER — Encounter (HOSPITAL_COMMUNITY)
Admission: RE | Admit: 2016-09-06 | Discharge: 2016-09-06 | Disposition: A | Discharge status: Home / Self Care | Payer: Self-pay | Source: Ambulatory Visit | Attending: Interventional Cardiology | Admitting: Interventional Cardiology

## 2016-09-08 ENCOUNTER — Encounter (HOSPITAL_COMMUNITY)
Admission: RE | Admit: 2016-09-08 | Discharge: 2016-09-08 | Disposition: A | Discharge status: Home / Self Care | Payer: Self-pay | Source: Ambulatory Visit | Attending: Interventional Cardiology | Admitting: Interventional Cardiology

## 2016-09-10 ENCOUNTER — Encounter (HOSPITAL_COMMUNITY)
Admission: RE | Admit: 2016-09-10 | Discharge: 2016-09-10 | Disposition: A | Discharge status: Home / Self Care | Payer: Self-pay | Source: Ambulatory Visit | Attending: Interventional Cardiology | Admitting: Interventional Cardiology

## 2016-09-13 ENCOUNTER — Encounter (HOSPITAL_COMMUNITY)
Admission: RE | Admit: 2016-09-13 | Discharge: 2016-09-13 | Disposition: A | Discharge status: Home / Self Care | Payer: Self-pay | Source: Ambulatory Visit | Attending: Interventional Cardiology | Admitting: Interventional Cardiology

## 2016-09-15 ENCOUNTER — Encounter (HOSPITAL_COMMUNITY)
Admission: RE | Admit: 2016-09-15 | Discharge: 2016-09-15 | Disposition: A | Discharge status: Home / Self Care | Payer: Self-pay | Source: Ambulatory Visit | Attending: Interventional Cardiology | Admitting: Interventional Cardiology

## 2016-09-17 ENCOUNTER — Encounter (HOSPITAL_COMMUNITY)
Admission: RE | Admit: 2016-09-17 | Discharge: 2016-09-17 | Disposition: A | Discharge status: Home / Self Care | Payer: Self-pay | Source: Ambulatory Visit | Attending: Interventional Cardiology | Admitting: Interventional Cardiology

## 2016-09-20 ENCOUNTER — Encounter (HOSPITAL_COMMUNITY)
Admission: RE | Admit: 2016-09-20 | Discharge: 2016-09-20 | Disposition: A | Discharge status: Home / Self Care | Payer: Self-pay | Source: Ambulatory Visit | Attending: Interventional Cardiology | Admitting: Interventional Cardiology

## 2016-09-22 ENCOUNTER — Encounter (HOSPITAL_COMMUNITY)
Admission: RE | Admit: 2016-09-22 | Discharge: 2016-09-22 | Disposition: A | Discharge status: Home / Self Care | Payer: Medicare HMO | Source: Ambulatory Visit | Attending: Interventional Cardiology | Admitting: Interventional Cardiology

## 2016-09-22 DIAGNOSIS — Z9889 Other specified postprocedural states: Secondary | ICD-10-CM | POA: Insufficient documentation

## 2016-09-24 ENCOUNTER — Encounter (HOSPITAL_COMMUNITY)
Admission: RE | Admit: 2016-09-24 | Discharge: 2016-09-24 | Disposition: A | Discharge status: Home / Self Care | Payer: Medicare HMO | Source: Ambulatory Visit | Attending: Interventional Cardiology | Admitting: Interventional Cardiology

## 2016-09-27 ENCOUNTER — Encounter (HOSPITAL_COMMUNITY)
Admission: RE | Admit: 2016-09-27 | Discharge: 2016-09-27 | Disposition: A | Discharge status: Home / Self Care | Payer: Medicare HMO | Source: Ambulatory Visit | Attending: Interventional Cardiology | Admitting: Interventional Cardiology

## 2016-09-29 ENCOUNTER — Encounter (HOSPITAL_COMMUNITY)
Admission: RE | Admit: 2016-09-29 | Discharge: 2016-09-29 | Disposition: A | Discharge status: Home / Self Care | Payer: Medicare HMO | Source: Ambulatory Visit | Attending: Interventional Cardiology | Admitting: Interventional Cardiology

## 2016-10-01 ENCOUNTER — Encounter (HOSPITAL_COMMUNITY)
Admission: RE | Admit: 2016-10-01 | Discharge: 2016-10-01 | Disposition: A | Discharge status: Home / Self Care | Payer: Medicare HMO | Source: Ambulatory Visit | Attending: Interventional Cardiology | Admitting: Interventional Cardiology

## 2016-10-04 ENCOUNTER — Encounter (HOSPITAL_COMMUNITY)
Admission: RE | Admit: 2016-10-04 | Discharge: 2016-10-04 | Disposition: A | Discharge status: Home / Self Care | Payer: Medicare HMO | Source: Ambulatory Visit | Attending: Interventional Cardiology | Admitting: Interventional Cardiology

## 2016-10-06 ENCOUNTER — Encounter (HOSPITAL_COMMUNITY)
Admission: RE | Admit: 2016-10-06 | Discharge: 2016-10-06 | Disposition: A | Discharge status: Home / Self Care | Payer: Medicare HMO | Source: Ambulatory Visit | Attending: Interventional Cardiology | Admitting: Interventional Cardiology

## 2016-10-08 ENCOUNTER — Encounter (HOSPITAL_COMMUNITY)
Admission: RE | Admit: 2016-10-08 | Discharge: 2016-10-08 | Disposition: A | Discharge status: Home / Self Care | Payer: Medicare HMO | Source: Ambulatory Visit | Attending: Interventional Cardiology | Admitting: Interventional Cardiology

## 2016-10-11 ENCOUNTER — Encounter (HOSPITAL_COMMUNITY)
Admission: RE | Admit: 2016-10-11 | Discharge: 2016-10-11 | Disposition: A | Discharge status: Home / Self Care | Payer: Medicare HMO | Source: Ambulatory Visit | Attending: Interventional Cardiology | Admitting: Interventional Cardiology

## 2016-10-13 ENCOUNTER — Encounter (HOSPITAL_COMMUNITY)
Admission: RE | Admit: 2016-10-13 | Discharge: 2016-10-13 | Disposition: A | Discharge status: Home / Self Care | Payer: Medicare HMO | Source: Ambulatory Visit | Attending: Interventional Cardiology | Admitting: Interventional Cardiology

## 2016-10-15 ENCOUNTER — Encounter (HOSPITAL_COMMUNITY)
Admission: RE | Admit: 2016-10-15 | Discharge: 2016-10-15 | Disposition: A | Discharge status: Home / Self Care | Payer: Medicare HMO | Source: Ambulatory Visit | Attending: Interventional Cardiology | Admitting: Interventional Cardiology

## 2016-10-18 ENCOUNTER — Encounter (HOSPITAL_COMMUNITY)
Admission: RE | Admit: 2016-10-18 | Discharge: 2016-10-18 | Disposition: A | Discharge status: Home / Self Care | Payer: Medicare HMO | Source: Ambulatory Visit | Attending: Interventional Cardiology | Admitting: Interventional Cardiology

## 2016-10-20 ENCOUNTER — Encounter (HOSPITAL_COMMUNITY)
Admission: RE | Admit: 2016-10-20 | Discharge: 2016-10-20 | Disposition: A | Discharge status: Home / Self Care | Payer: Medicare HMO | Source: Ambulatory Visit | Attending: Interventional Cardiology | Admitting: Interventional Cardiology

## 2016-10-22 ENCOUNTER — Encounter (HOSPITAL_COMMUNITY)
Admission: RE | Admit: 2016-10-22 | Discharge: 2016-10-22 | Disposition: A | Discharge status: Home / Self Care | Payer: Medicare HMO | Source: Ambulatory Visit | Attending: Interventional Cardiology | Admitting: Interventional Cardiology

## 2016-10-27 ENCOUNTER — Encounter (HOSPITAL_COMMUNITY)
Admission: RE | Admit: 2016-10-27 | Discharge: 2016-10-27 | Disposition: A | Discharge status: Home / Self Care | Payer: Medicare HMO | Source: Ambulatory Visit | Attending: Interventional Cardiology | Admitting: Interventional Cardiology

## 2016-10-27 DIAGNOSIS — Z9889 Other specified postprocedural states: Secondary | ICD-10-CM | POA: Diagnosis not present

## 2016-10-29 ENCOUNTER — Encounter (HOSPITAL_COMMUNITY)
Admission: RE | Admit: 2016-10-29 | Discharge: 2016-10-29 | Disposition: A | Discharge status: Home / Self Care | Payer: Medicare HMO | Source: Ambulatory Visit | Attending: Interventional Cardiology | Admitting: Interventional Cardiology

## 2016-11-01 ENCOUNTER — Encounter (HOSPITAL_COMMUNITY)
Admission: RE | Admit: 2016-11-01 | Discharge: 2016-11-01 | Disposition: A | Discharge status: Home / Self Care | Payer: Medicare HMO | Source: Ambulatory Visit | Attending: Interventional Cardiology | Admitting: Interventional Cardiology

## 2016-11-03 ENCOUNTER — Encounter (HOSPITAL_COMMUNITY)
Admission: RE | Admit: 2016-11-03 | Discharge: 2016-11-03 | Disposition: A | Discharge status: Home / Self Care | Payer: Medicare HMO | Source: Ambulatory Visit | Attending: Interventional Cardiology | Admitting: Interventional Cardiology

## 2016-11-05 ENCOUNTER — Encounter (HOSPITAL_COMMUNITY)
Admission: RE | Admit: 2016-11-05 | Discharge: 2016-11-05 | Disposition: A | Discharge status: Home / Self Care | Payer: Medicare HMO | Source: Ambulatory Visit | Attending: Interventional Cardiology | Admitting: Interventional Cardiology

## 2016-11-08 ENCOUNTER — Encounter (HOSPITAL_COMMUNITY)
Admission: RE | Admit: 2016-11-08 | Discharge: 2016-11-08 | Disposition: A | Discharge status: Home / Self Care | Payer: Medicare HMO | Source: Ambulatory Visit | Attending: Interventional Cardiology | Admitting: Interventional Cardiology

## 2016-11-10 ENCOUNTER — Encounter (HOSPITAL_COMMUNITY)
Admission: RE | Admit: 2016-11-10 | Discharge: 2016-11-10 | Disposition: A | Discharge status: Home / Self Care | Payer: Medicare HMO | Source: Ambulatory Visit | Attending: Interventional Cardiology | Admitting: Interventional Cardiology

## 2016-11-12 ENCOUNTER — Encounter (HOSPITAL_COMMUNITY)
Admission: RE | Admit: 2016-11-12 | Discharge: 2016-11-12 | Disposition: A | Discharge status: Home / Self Care | Payer: Medicare HMO | Source: Ambulatory Visit | Attending: Interventional Cardiology | Admitting: Interventional Cardiology

## 2016-11-15 ENCOUNTER — Encounter (HOSPITAL_COMMUNITY)
Admission: RE | Admit: 2016-11-15 | Discharge: 2016-11-15 | Disposition: A | Discharge status: Home / Self Care | Payer: Medicare HMO | Source: Ambulatory Visit | Attending: Interventional Cardiology | Admitting: Interventional Cardiology

## 2016-11-17 ENCOUNTER — Encounter (HOSPITAL_COMMUNITY)
Admission: RE | Admit: 2016-11-17 | Discharge: 2016-11-17 | Disposition: A | Discharge status: Home / Self Care | Payer: Medicare HMO | Source: Ambulatory Visit | Attending: Interventional Cardiology | Admitting: Interventional Cardiology

## 2016-11-19 ENCOUNTER — Encounter (HOSPITAL_COMMUNITY)
Admission: RE | Admit: 2016-11-19 | Discharge: 2016-11-19 | Disposition: A | Discharge status: Home / Self Care | Payer: Medicare HMO | Source: Ambulatory Visit | Attending: Interventional Cardiology | Admitting: Interventional Cardiology

## 2016-11-22 ENCOUNTER — Encounter (HOSPITAL_COMMUNITY)
Admission: RE | Admit: 2016-11-22 | Discharge: 2016-11-22 | Disposition: A | Discharge status: Home / Self Care | Payer: Medicare HMO | Source: Ambulatory Visit | Attending: Interventional Cardiology | Admitting: Interventional Cardiology

## 2016-11-22 DIAGNOSIS — Z9889 Other specified postprocedural states: Secondary | ICD-10-CM | POA: Diagnosis present

## 2016-11-24 ENCOUNTER — Encounter (HOSPITAL_COMMUNITY)
Admission: RE | Admit: 2016-11-24 | Discharge: 2016-11-24 | Disposition: A | Discharge status: Home / Self Care | Payer: Medicare HMO | Source: Ambulatory Visit | Attending: Interventional Cardiology | Admitting: Interventional Cardiology

## 2016-11-25 DIAGNOSIS — Z23 Encounter for immunization: Secondary | ICD-10-CM | POA: Diagnosis not present

## 2016-11-26 ENCOUNTER — Encounter (HOSPITAL_COMMUNITY)
Admission: RE | Admit: 2016-11-26 | Discharge: 2016-11-26 | Disposition: A | Discharge status: Home / Self Care | Payer: Medicare HMO | Source: Ambulatory Visit | Attending: Interventional Cardiology | Admitting: Interventional Cardiology

## 2016-11-29 ENCOUNTER — Encounter (HOSPITAL_COMMUNITY): Payer: Medicare HMO

## 2016-12-01 ENCOUNTER — Encounter (HOSPITAL_COMMUNITY)
Admission: RE | Admit: 2016-12-01 | Discharge: 2016-12-01 | Disposition: A | Discharge status: Home / Self Care | Payer: Medicare HMO | Source: Ambulatory Visit | Attending: Interventional Cardiology | Admitting: Interventional Cardiology

## 2016-12-03 ENCOUNTER — Encounter (HOSPITAL_COMMUNITY)
Admission: RE | Admit: 2016-12-03 | Discharge: 2016-12-03 | Disposition: A | Discharge status: Home / Self Care | Payer: Medicare HMO | Source: Ambulatory Visit | Attending: Interventional Cardiology | Admitting: Interventional Cardiology

## 2016-12-06 ENCOUNTER — Encounter (HOSPITAL_COMMUNITY)
Admission: RE | Admit: 2016-12-06 | Discharge: 2016-12-06 | Disposition: A | Discharge status: Home / Self Care | Payer: Medicare HMO | Source: Ambulatory Visit | Attending: Interventional Cardiology | Admitting: Interventional Cardiology

## 2016-12-08 ENCOUNTER — Encounter (HOSPITAL_COMMUNITY)
Admission: RE | Admit: 2016-12-08 | Discharge: 2016-12-08 | Disposition: A | Discharge status: Home / Self Care | Payer: Medicare HMO | Source: Ambulatory Visit | Attending: Interventional Cardiology | Admitting: Interventional Cardiology

## 2016-12-10 ENCOUNTER — Encounter (HOSPITAL_COMMUNITY): Payer: Medicare HMO

## 2016-12-13 ENCOUNTER — Encounter (HOSPITAL_COMMUNITY)
Admission: RE | Admit: 2016-12-13 | Discharge: 2016-12-13 | Disposition: A | Discharge status: Home / Self Care | Payer: Medicare HMO | Source: Ambulatory Visit | Attending: Interventional Cardiology | Admitting: Interventional Cardiology

## 2016-12-15 ENCOUNTER — Encounter (HOSPITAL_COMMUNITY)
Admission: RE | Admit: 2016-12-15 | Discharge: 2016-12-15 | Disposition: A | Discharge status: Home / Self Care | Payer: Medicare HMO | Source: Ambulatory Visit | Attending: Interventional Cardiology | Admitting: Interventional Cardiology

## 2016-12-17 ENCOUNTER — Encounter (HOSPITAL_COMMUNITY)
Admission: RE | Admit: 2016-12-17 | Discharge: 2016-12-17 | Disposition: A | Discharge status: Home / Self Care | Payer: Medicare HMO | Source: Ambulatory Visit | Attending: Interventional Cardiology | Admitting: Interventional Cardiology

## 2016-12-17 DIAGNOSIS — L814 Other melanin hyperpigmentation: Secondary | ICD-10-CM | POA: Diagnosis not present

## 2016-12-17 DIAGNOSIS — L298 Other pruritus: Secondary | ICD-10-CM | POA: Diagnosis not present

## 2016-12-17 DIAGNOSIS — D1801 Hemangioma of skin and subcutaneous tissue: Secondary | ICD-10-CM | POA: Diagnosis not present

## 2016-12-20 ENCOUNTER — Encounter (HOSPITAL_COMMUNITY): Payer: Medicare HMO

## 2016-12-21 DIAGNOSIS — Z0100 Encounter for examination of eyes and vision without abnormal findings: Secondary | ICD-10-CM | POA: Diagnosis not present

## 2016-12-22 ENCOUNTER — Encounter (HOSPITAL_COMMUNITY)
Admission: RE | Admit: 2016-12-22 | Discharge: 2016-12-22 | Disposition: A | Discharge status: Home / Self Care | Payer: Medicare HMO | Source: Ambulatory Visit | Attending: Interventional Cardiology | Admitting: Interventional Cardiology

## 2016-12-24 ENCOUNTER — Encounter (HOSPITAL_COMMUNITY)
Admission: RE | Admit: 2016-12-24 | Discharge: 2016-12-24 | Disposition: A | Discharge status: Home / Self Care | Payer: Medicare HMO | Source: Ambulatory Visit | Attending: Interventional Cardiology | Admitting: Interventional Cardiology

## 2016-12-24 DIAGNOSIS — Z9889 Other specified postprocedural states: Secondary | ICD-10-CM | POA: Insufficient documentation

## 2016-12-27 ENCOUNTER — Encounter (HOSPITAL_COMMUNITY): Payer: Medicare HMO

## 2016-12-29 ENCOUNTER — Encounter (HOSPITAL_COMMUNITY)
Admission: RE | Admit: 2016-12-29 | Discharge: 2016-12-29 | Disposition: A | Discharge status: Home / Self Care | Payer: Medicare HMO | Source: Ambulatory Visit | Attending: Interventional Cardiology | Admitting: Interventional Cardiology

## 2016-12-31 ENCOUNTER — Encounter (HOSPITAL_COMMUNITY)
Admission: RE | Admit: 2016-12-31 | Discharge: 2016-12-31 | Disposition: A | Discharge status: Home / Self Care | Payer: Medicare HMO | Source: Ambulatory Visit | Attending: Interventional Cardiology | Admitting: Interventional Cardiology

## 2017-01-03 ENCOUNTER — Encounter (HOSPITAL_COMMUNITY)
Admission: RE | Admit: 2017-01-03 | Discharge: 2017-01-03 | Disposition: A | Discharge status: Home / Self Care | Payer: Medicare HMO | Source: Ambulatory Visit | Attending: Interventional Cardiology | Admitting: Interventional Cardiology

## 2017-01-04 DIAGNOSIS — R69 Illness, unspecified: Secondary | ICD-10-CM | POA: Diagnosis not present

## 2017-01-05 ENCOUNTER — Encounter (HOSPITAL_COMMUNITY)
Admission: RE | Admit: 2017-01-05 | Discharge: 2017-01-05 | Disposition: A | Discharge status: Home / Self Care | Payer: Medicare HMO | Source: Ambulatory Visit | Attending: Interventional Cardiology | Admitting: Interventional Cardiology

## 2017-01-07 ENCOUNTER — Encounter (HOSPITAL_COMMUNITY)
Admission: RE | Admit: 2017-01-07 | Discharge: 2017-01-07 | Disposition: A | Discharge status: Home / Self Care | Payer: Medicare HMO | Source: Ambulatory Visit | Attending: Interventional Cardiology | Admitting: Interventional Cardiology

## 2017-01-10 ENCOUNTER — Encounter (HOSPITAL_COMMUNITY)
Admission: RE | Admit: 2017-01-10 | Discharge: 2017-01-10 | Disposition: A | Discharge status: Home / Self Care | Payer: Medicare HMO | Source: Ambulatory Visit | Attending: Interventional Cardiology | Admitting: Interventional Cardiology

## 2017-01-12 ENCOUNTER — Encounter (HOSPITAL_COMMUNITY)
Admission: RE | Admit: 2017-01-12 | Discharge: 2017-01-12 | Disposition: A | Discharge status: Home / Self Care | Payer: Medicare HMO | Source: Ambulatory Visit | Attending: Interventional Cardiology | Admitting: Interventional Cardiology

## 2017-01-17 ENCOUNTER — Encounter (HOSPITAL_COMMUNITY)
Admission: RE | Admit: 2017-01-17 | Discharge: 2017-01-17 | Disposition: A | Discharge status: Home / Self Care | Payer: Medicare HMO | Source: Ambulatory Visit | Attending: Interventional Cardiology | Admitting: Interventional Cardiology

## 2017-01-18 DIAGNOSIS — R202 Paresthesia of skin: Secondary | ICD-10-CM | POA: Diagnosis not present

## 2017-01-18 DIAGNOSIS — Z79899 Other long term (current) drug therapy: Secondary | ICD-10-CM | POA: Diagnosis not present

## 2017-01-18 DIAGNOSIS — I2510 Atherosclerotic heart disease of native coronary artery without angina pectoris: Secondary | ICD-10-CM | POA: Diagnosis not present

## 2017-01-18 DIAGNOSIS — E7800 Pure hypercholesterolemia, unspecified: Secondary | ICD-10-CM | POA: Diagnosis not present

## 2017-01-18 DIAGNOSIS — R69 Illness, unspecified: Secondary | ICD-10-CM | POA: Diagnosis not present

## 2017-01-18 DIAGNOSIS — Z0000 Encounter for general adult medical examination without abnormal findings: Secondary | ICD-10-CM | POA: Diagnosis not present

## 2017-01-18 DIAGNOSIS — Z1389 Encounter for screening for other disorder: Secondary | ICD-10-CM | POA: Diagnosis not present

## 2017-01-18 DIAGNOSIS — N529 Male erectile dysfunction, unspecified: Secondary | ICD-10-CM | POA: Diagnosis not present

## 2017-01-19 ENCOUNTER — Encounter (HOSPITAL_COMMUNITY)
Admission: RE | Admit: 2017-01-19 | Discharge: 2017-01-19 | Disposition: A | Discharge status: Home / Self Care | Payer: Medicare HMO | Source: Ambulatory Visit | Attending: Interventional Cardiology | Admitting: Interventional Cardiology

## 2017-01-21 ENCOUNTER — Encounter (HOSPITAL_COMMUNITY)
Admission: RE | Admit: 2017-01-21 | Discharge: 2017-01-21 | Disposition: A | Discharge status: Home / Self Care | Payer: Medicare HMO | Source: Ambulatory Visit | Attending: Interventional Cardiology | Admitting: Interventional Cardiology

## 2017-01-24 ENCOUNTER — Encounter (HOSPITAL_COMMUNITY)
Admission: RE | Admit: 2017-01-24 | Discharge: 2017-01-24 | Disposition: A | Discharge status: Home / Self Care | Payer: Medicare HMO | Source: Ambulatory Visit | Attending: Interventional Cardiology | Admitting: Interventional Cardiology

## 2017-01-24 DIAGNOSIS — Z9889 Other specified postprocedural states: Secondary | ICD-10-CM | POA: Diagnosis present

## 2017-01-26 ENCOUNTER — Encounter (HOSPITAL_COMMUNITY)
Admission: RE | Admit: 2017-01-26 | Discharge: 2017-01-26 | Disposition: A | Discharge status: Home / Self Care | Payer: Medicare HMO | Source: Ambulatory Visit | Attending: Interventional Cardiology | Admitting: Interventional Cardiology

## 2017-01-28 ENCOUNTER — Encounter (HOSPITAL_COMMUNITY)
Admission: RE | Admit: 2017-01-28 | Discharge: 2017-01-28 | Disposition: A | Discharge status: Home / Self Care | Payer: Medicare HMO | Source: Ambulatory Visit | Attending: Interventional Cardiology | Admitting: Interventional Cardiology

## 2017-02-02 ENCOUNTER — Encounter (HOSPITAL_COMMUNITY)
Admission: RE | Admit: 2017-02-02 | Discharge: 2017-02-02 | Disposition: A | Discharge status: Home / Self Care | Payer: Medicare HMO | Source: Ambulatory Visit | Attending: Interventional Cardiology | Admitting: Interventional Cardiology

## 2017-02-04 ENCOUNTER — Encounter (HOSPITAL_COMMUNITY)
Admission: RE | Admit: 2017-02-04 | Discharge: 2017-02-04 | Disposition: A | Discharge status: Home / Self Care | Payer: Medicare HMO | Source: Ambulatory Visit | Attending: Interventional Cardiology | Admitting: Interventional Cardiology

## 2017-02-07 ENCOUNTER — Encounter (HOSPITAL_COMMUNITY)
Admission: RE | Admit: 2017-02-07 | Discharge: 2017-02-07 | Disposition: A | Discharge status: Home / Self Care | Payer: Medicare HMO | Source: Ambulatory Visit | Attending: Interventional Cardiology | Admitting: Interventional Cardiology

## 2017-02-09 ENCOUNTER — Encounter (HOSPITAL_COMMUNITY)
Admission: RE | Admit: 2017-02-09 | Discharge: 2017-02-09 | Disposition: A | Discharge status: Home / Self Care | Payer: Medicare HMO | Source: Ambulatory Visit | Attending: Interventional Cardiology | Admitting: Interventional Cardiology

## 2017-02-11 ENCOUNTER — Encounter (HOSPITAL_COMMUNITY)
Admission: RE | Admit: 2017-02-11 | Discharge: 2017-02-11 | Disposition: A | Discharge status: Home / Self Care | Payer: Medicare HMO | Source: Ambulatory Visit | Attending: Interventional Cardiology | Admitting: Interventional Cardiology

## 2017-02-16 ENCOUNTER — Encounter (HOSPITAL_COMMUNITY)
Admission: RE | Admit: 2017-02-16 | Discharge: 2017-02-16 | Disposition: A | Discharge status: Home / Self Care | Payer: Medicare HMO | Source: Ambulatory Visit | Attending: Interventional Cardiology | Admitting: Interventional Cardiology

## 2017-02-18 ENCOUNTER — Encounter (HOSPITAL_COMMUNITY)
Admission: RE | Admit: 2017-02-18 | Discharge: 2017-02-18 | Disposition: A | Discharge status: Home / Self Care | Payer: Medicare HMO | Source: Ambulatory Visit | Attending: Interventional Cardiology | Admitting: Interventional Cardiology

## 2017-02-18 DIAGNOSIS — F329 Major depressive disorder, single episode, unspecified: Secondary | ICD-10-CM | POA: Diagnosis not present

## 2017-02-18 DIAGNOSIS — I4891 Unspecified atrial fibrillation: Secondary | ICD-10-CM | POA: Diagnosis not present

## 2017-02-18 DIAGNOSIS — E7800 Pure hypercholesterolemia, unspecified: Secondary | ICD-10-CM | POA: Diagnosis not present

## 2017-02-18 DIAGNOSIS — I2510 Atherosclerotic heart disease of native coronary artery without angina pectoris: Secondary | ICD-10-CM | POA: Diagnosis not present

## 2017-02-18 DIAGNOSIS — R69 Illness, unspecified: Secondary | ICD-10-CM | POA: Diagnosis not present

## 2017-02-21 ENCOUNTER — Encounter (HOSPITAL_COMMUNITY)
Admission: RE | Admit: 2017-02-21 | Discharge: 2017-02-21 | Disposition: A | Discharge status: Home / Self Care | Payer: Medicare HMO | Source: Ambulatory Visit | Attending: Interventional Cardiology | Admitting: Interventional Cardiology

## 2017-02-23 ENCOUNTER — Encounter (HOSPITAL_COMMUNITY): Payer: Self-pay

## 2017-02-23 DIAGNOSIS — Z9889 Other specified postprocedural states: Secondary | ICD-10-CM | POA: Insufficient documentation

## 2017-02-25 ENCOUNTER — Encounter (HOSPITAL_COMMUNITY)
Admission: RE | Admit: 2017-02-25 | Discharge: 2017-02-25 | Disposition: A | Discharge status: Home / Self Care | Payer: Self-pay | Source: Ambulatory Visit | Attending: Interventional Cardiology | Admitting: Interventional Cardiology

## 2017-02-28 ENCOUNTER — Encounter (HOSPITAL_COMMUNITY)
Admission: RE | Admit: 2017-02-28 | Discharge: 2017-02-28 | Disposition: A | Discharge status: Home / Self Care | Payer: Self-pay | Source: Ambulatory Visit | Attending: Interventional Cardiology | Admitting: Interventional Cardiology

## 2017-03-02 ENCOUNTER — Encounter (HOSPITAL_COMMUNITY)
Admission: RE | Admit: 2017-03-02 | Discharge: 2017-03-02 | Disposition: A | Discharge status: Home / Self Care | Payer: Self-pay | Source: Ambulatory Visit | Attending: Interventional Cardiology | Admitting: Interventional Cardiology

## 2017-03-04 ENCOUNTER — Encounter (HOSPITAL_COMMUNITY)
Admission: RE | Admit: 2017-03-04 | Discharge: 2017-03-04 | Disposition: A | Discharge status: Home / Self Care | Payer: Medicare HMO | Source: Ambulatory Visit | Attending: Interventional Cardiology | Admitting: Interventional Cardiology

## 2017-03-07 ENCOUNTER — Encounter (HOSPITAL_COMMUNITY)
Admission: RE | Admit: 2017-03-07 | Discharge: 2017-03-07 | Disposition: A | Discharge status: Home / Self Care | Payer: Self-pay | Source: Ambulatory Visit | Attending: Interventional Cardiology | Admitting: Interventional Cardiology

## 2017-03-09 ENCOUNTER — Encounter (HOSPITAL_COMMUNITY)
Admission: RE | Admit: 2017-03-09 | Discharge: 2017-03-09 | Disposition: A | Discharge status: Home / Self Care | Payer: Self-pay | Source: Ambulatory Visit | Attending: Interventional Cardiology | Admitting: Interventional Cardiology

## 2017-03-11 ENCOUNTER — Encounter (HOSPITAL_COMMUNITY)
Admission: RE | Admit: 2017-03-11 | Discharge: 2017-03-11 | Disposition: A | Discharge status: Home / Self Care | Payer: Self-pay | Source: Ambulatory Visit | Attending: Interventional Cardiology | Admitting: Interventional Cardiology

## 2017-03-14 ENCOUNTER — Encounter (HOSPITAL_COMMUNITY)
Admission: RE | Admit: 2017-03-14 | Discharge: 2017-03-14 | Disposition: A | Discharge status: Home / Self Care | Payer: Medicare HMO | Source: Ambulatory Visit | Attending: Interventional Cardiology | Admitting: Interventional Cardiology

## 2017-03-16 ENCOUNTER — Encounter (HOSPITAL_COMMUNITY)
Admission: RE | Admit: 2017-03-16 | Discharge: 2017-03-16 | Disposition: A | Discharge status: Home / Self Care | Payer: Self-pay | Source: Ambulatory Visit | Attending: Interventional Cardiology | Admitting: Interventional Cardiology

## 2017-03-18 ENCOUNTER — Other Ambulatory Visit: Payer: Self-pay | Admitting: Interventional Cardiology

## 2017-03-18 ENCOUNTER — Encounter (HOSPITAL_COMMUNITY)
Admission: RE | Admit: 2017-03-18 | Discharge: 2017-03-18 | Disposition: A | Discharge status: Home / Self Care | Payer: Medicare HMO | Source: Ambulatory Visit | Attending: Interventional Cardiology | Admitting: Interventional Cardiology

## 2017-03-18 NOTE — Telephone Encounter (Signed)
Pt's medication was sent to pt's pharmacy as requested. Confirmation received.  °

## 2017-03-21 ENCOUNTER — Encounter (HOSPITAL_COMMUNITY)
Admission: RE | Admit: 2017-03-21 | Discharge: 2017-03-21 | Disposition: A | Discharge status: Home / Self Care | Payer: Self-pay | Source: Ambulatory Visit | Attending: Interventional Cardiology | Admitting: Interventional Cardiology

## 2017-03-22 DIAGNOSIS — H903 Sensorineural hearing loss, bilateral: Secondary | ICD-10-CM | POA: Diagnosis not present

## 2017-03-23 ENCOUNTER — Encounter (HOSPITAL_COMMUNITY)
Admission: RE | Admit: 2017-03-23 | Discharge: 2017-03-23 | Disposition: A | Discharge status: Home / Self Care | Payer: Self-pay | Source: Ambulatory Visit | Attending: Interventional Cardiology | Admitting: Interventional Cardiology

## 2017-03-25 ENCOUNTER — Encounter (HOSPITAL_COMMUNITY)
Admission: RE | Admit: 2017-03-25 | Discharge: 2017-03-25 | Disposition: A | Discharge status: Home / Self Care | Payer: Medicare HMO | Source: Ambulatory Visit | Attending: Interventional Cardiology | Admitting: Interventional Cardiology

## 2017-03-25 DIAGNOSIS — Z9889 Other specified postprocedural states: Secondary | ICD-10-CM | POA: Insufficient documentation

## 2017-03-28 ENCOUNTER — Encounter (HOSPITAL_COMMUNITY)
Admission: RE | Admit: 2017-03-28 | Discharge: 2017-03-28 | Disposition: A | Discharge status: Home / Self Care | Payer: Medicare HMO | Source: Ambulatory Visit | Attending: Interventional Cardiology | Admitting: Interventional Cardiology

## 2017-03-29 DIAGNOSIS — H9193 Unspecified hearing loss, bilateral: Secondary | ICD-10-CM | POA: Diagnosis not present

## 2017-03-29 DIAGNOSIS — H6983 Other specified disorders of Eustachian tube, bilateral: Secondary | ICD-10-CM | POA: Diagnosis not present

## 2017-03-29 DIAGNOSIS — H8113 Benign paroxysmal vertigo, bilateral: Secondary | ICD-10-CM | POA: Diagnosis not present

## 2017-03-30 ENCOUNTER — Encounter (HOSPITAL_COMMUNITY)
Admission: RE | Admit: 2017-03-30 | Discharge: 2017-03-30 | Disposition: A | Discharge status: Home / Self Care | Payer: Medicare HMO | Source: Ambulatory Visit | Attending: Interventional Cardiology | Admitting: Interventional Cardiology

## 2017-04-01 ENCOUNTER — Encounter (HOSPITAL_COMMUNITY)
Admission: RE | Admit: 2017-04-01 | Discharge: 2017-04-01 | Disposition: A | Discharge status: Home / Self Care | Payer: Medicare HMO | Source: Ambulatory Visit | Attending: Interventional Cardiology | Admitting: Interventional Cardiology

## 2017-04-04 ENCOUNTER — Encounter (HOSPITAL_COMMUNITY)
Admission: RE | Admit: 2017-04-04 | Discharge: 2017-04-04 | Disposition: A | Discharge status: Home / Self Care | Payer: Medicare HMO | Source: Ambulatory Visit | Attending: Interventional Cardiology | Admitting: Interventional Cardiology

## 2017-04-04 NOTE — Progress Notes (Signed)
Cardiology Office Note    Date:  04/05/2017   ID:  Jerry Tanner, DOB August 22, 1950, MRN [id number]  PCP:  Jerry Huddle, MD  Cardiologist: Jerry Grooms, MD   Chief Complaint  Patient presents with  . Coronary Artery Disease  . Cardiac Valve Problem    History of Present Illness:  NAPHTALI Tanner is a 67 y.o. male with history of mitral valve repair 2011, moderate coronary disease by pre-surgical catheterization, essential hypertension, and hyperlipidemia.   Jerry Tanner is in phase 3 cardiac rehab.  He has done this dutifully for nearly 10 years.  Mitral valve repair occurred in January 2011.  It was performed by Dr. Darylene Tanner.  He denies orthopnea, PND, palpitations, exertional chest discomfort, claudication, but does report occasional low blood pressures when he checks him for rehab.  These blood pressures may be less than 921 mmHg systolic.    Past Medical History:  Diagnosis Date  . Atherosclerosis of native coronary artery of native heart without angina pectoris 02/01/2013  . H/O mitral valve repair   . Heart disease     Past Surgical History:  Procedure Laterality Date  . MITRAL VALVE REPAIR    . TONSILLECTOMY      Current Medications: Outpatient Medications Prior to Visit  Medication Sig Dispense Refill  . amoxicillin (AMOXIL) 500 MG capsule Take 4 capsules by mouth as needed. Prior to dental procedures    . citalopram (CELEXA) 40 MG tablet Take 20 mg by mouth daily.     . metoprolol succinate (TOPROL-XL) 25 MG 24 hr tablet Take 1 tablet (25 mg total) by mouth daily. Please keep upcoming appt for future refills. Thank you 90 tablet 0  . simvastatin (ZOCOR) 20 MG tablet Take 20 mg by mouth daily.      No facility-administered medications prior to visit.      Allergies:   Patient has no known allergies.   Social History   Socioeconomic History  . Marital status: Married    Spouse name: None  . Number of children: None  . Years of education: None  . Highest  education level: None  Social Needs  . Financial resource strain: None  . Food insecurity - worry: None  . Food insecurity - inability: None  . Transportation needs - medical: None  . Transportation needs - non-medical: None  Occupational History  . None  Tobacco Use  . Smoking status: Never Smoker  . Smokeless tobacco: Never Used  Substance and Sexual Activity  . Alcohol use: No    Alcohol/week: 0.0 oz  . Drug use: No  . Sexual activity: None  Other Topics Concern  . None  Social History Narrative  . None     Family History:  The patient's family history includes Breast cancer in his mother; Heart Problems in his father and mother.   ROS:   Please see the history of present illness.    None  All other systems reviewed and are negative.   PHYSICAL EXAM:   VS:  BP 118/80   Pulse 61   Ht 5\' 7"  (1.702 m)   Wt 172 lb 6.4 oz (78.2 kg)   SpO2 95%   BMI 27.00 kg/m    GEN: Well nourished, well developed, in no acute distress  HEENT: normal  Neck: no JVD, carotid bruits, or masses Cardiac: RRR; no murmurs, rubs, or gallops,no edema  Respiratory:  clear to auscultation bilaterally, normal work of breathing GI: soft, nontender, nondistended, +  BS MS: no deformity or atrophy  Skin: warm and dry, no rash Neuro:  Alert and Oriented x 3, Strength and sensation are intact Psych: euthymic mood, full affect  Wt Readings from Last 3 Encounters:  04/05/17 172 lb 6.4 oz (78.2 kg)  03/30/16 171 lb 3.2 oz (77.7 kg)  04/09/15 167 lb 12.8 oz (76.1 kg)      Studies/Labs Reviewed:   EKG:  EKG left axis deviation, first-degree AV block, left atrial abnormality.  Recent Labs: No results found for requested labs within last 8760 hours.   Lipid Panel No results found for: CHOL, TRIG, HDL, CHOLHDL, VLDL, LDLCALC, LDLDIRECT  Additional studies/ records that were reviewed today include:  He has had no studies in 7 years.    ASSESSMENT:    1. Hx of mitral valve repair   2.  Mixed hyperlipidemia   3. Essential hypertension, benign   4. Atherosclerosis of native coronary artery of native heart without angina pectoris      PLAN:  In order of problems listed above:  1. Clinical auscultation is normal.  No murmurs heard. 2. LDL cholesterol is 75.  He is being treated with low intensity statin therapy, simvastatin 20 mg/day.  No changes needed. 3. He does not have a diagnosis of hypertension based upon recent blood pressure recordings.  I am decreasing metoprolol succinate to 12.5 mg once daily. 4. Asymptomatic coronary disease diagnosed at the time of coronary angiography.  Lipid management, baby aspirin one per day, and blood pressure control or risk factors being target.  We will add aspirin to the patient's target less.  Will decrease metoprolol succinate to 12.5 mg/day.  Will need to have a 2D Doppler echocardiogram done prior to the next visit to assess cardiac structure at which time he will be nearly 10 years out from mitral valve repair.  Medication Adjustments/Labs and Tests Ordered: Current medicines are reviewed at length with the patient today.  Concerns regarding medicines are outlined above.  Medication changes, Labs and Tests ordered today are listed in the Patient Instructions below. There are no Patient Instructions on file for this visit.   Signed, Jerry Grooms, MD  04/05/2017 10:35 AM    [facility] [place], [place], [place]  [address] Phone: [phone]; Fax: [phone]

## 2017-04-05 ENCOUNTER — Ambulatory Visit: Payer: Medicare HMO | Admitting: Interventional Cardiology

## 2017-04-05 ENCOUNTER — Other Ambulatory Visit: Payer: Self-pay | Admitting: Interventional Cardiology

## 2017-04-05 ENCOUNTER — Encounter: Payer: Self-pay | Admitting: Interventional Cardiology

## 2017-04-05 ENCOUNTER — Encounter (INDEPENDENT_AMBULATORY_CARE_PROVIDER_SITE_OTHER): Payer: Self-pay

## 2017-04-05 VITALS — BP 118/80 | HR 61 | Ht 67.0 in | Wt 172.4 lb

## 2017-04-05 DIAGNOSIS — E782 Mixed hyperlipidemia: Secondary | ICD-10-CM

## 2017-04-05 DIAGNOSIS — I10 Essential (primary) hypertension: Secondary | ICD-10-CM

## 2017-04-05 DIAGNOSIS — Z9889 Other specified postprocedural states: Secondary | ICD-10-CM | POA: Diagnosis not present

## 2017-04-05 DIAGNOSIS — I2510 Atherosclerotic heart disease of native coronary artery without angina pectoris: Secondary | ICD-10-CM

## 2017-04-05 MED ORDER — METOPROLOL SUCCINATE ER 25 MG PO TB24: 12.5000 mg | ORAL_TABLET | Freq: Every day | ORAL | 3 refills | Status: DC

## 2017-04-05 NOTE — Patient Instructions (Signed)
Medication Instructions:  1) DECREASE Metoprolol Succinate to 12.5mg  (1/2 tab) once daily  Labwork: None  Testing/Procedures: None  Follow-Up: Your physician wants you to follow-up in: 1 year with Dr. Tamala Julian.  You will receive a reminder letter in the mail two months in advance. If you don't receive a letter, please call our office to schedule the follow-up appointment.   Any Other Special Instructions Will Be Listed Below (If Applicable).     If you need a refill on your cardiac medications before your next appointment, please call your pharmacy.

## 2017-04-06 ENCOUNTER — Encounter (HOSPITAL_COMMUNITY)
Admission: RE | Admit: 2017-04-06 | Discharge: 2017-04-06 | Disposition: A | Discharge status: Home / Self Care | Payer: Medicare HMO | Source: Ambulatory Visit | Attending: Interventional Cardiology | Admitting: Interventional Cardiology

## 2017-04-08 ENCOUNTER — Encounter (HOSPITAL_COMMUNITY)
Admission: RE | Admit: 2017-04-08 | Discharge: 2017-04-08 | Disposition: A | Discharge status: Home / Self Care | Payer: Medicare HMO | Source: Ambulatory Visit | Attending: Interventional Cardiology | Admitting: Interventional Cardiology

## 2017-04-11 ENCOUNTER — Encounter (HOSPITAL_COMMUNITY)
Admission: RE | Admit: 2017-04-11 | Discharge: 2017-04-11 | Disposition: A | Discharge status: Home / Self Care | Payer: Medicare HMO | Source: Ambulatory Visit | Attending: Interventional Cardiology | Admitting: Interventional Cardiology

## 2017-04-13 ENCOUNTER — Encounter (HOSPITAL_COMMUNITY): Payer: Medicare HMO

## 2017-04-15 ENCOUNTER — Encounter (HOSPITAL_COMMUNITY)
Admission: RE | Admit: 2017-04-15 | Discharge: 2017-04-15 | Disposition: A | Discharge status: Home / Self Care | Payer: Medicare HMO | Source: Ambulatory Visit | Attending: Interventional Cardiology | Admitting: Interventional Cardiology

## 2017-04-18 ENCOUNTER — Encounter (HOSPITAL_COMMUNITY)
Admission: RE | Admit: 2017-04-18 | Discharge: 2017-04-18 | Disposition: A | Discharge status: Home / Self Care | Payer: Medicare HMO | Source: Ambulatory Visit | Attending: Interventional Cardiology | Admitting: Interventional Cardiology

## 2017-04-20 ENCOUNTER — Encounter (HOSPITAL_COMMUNITY)
Admission: RE | Admit: 2017-04-20 | Discharge: 2017-04-20 | Disposition: A | Discharge status: Home / Self Care | Payer: Medicare HMO | Source: Ambulatory Visit | Attending: Interventional Cardiology | Admitting: Interventional Cardiology

## 2017-04-22 ENCOUNTER — Encounter (HOSPITAL_COMMUNITY): Payer: Medicare HMO

## 2017-04-22 DIAGNOSIS — Z9889 Other specified postprocedural states: Secondary | ICD-10-CM | POA: Insufficient documentation

## 2017-04-25 ENCOUNTER — Encounter (HOSPITAL_COMMUNITY)
Admission: RE | Admit: 2017-04-25 | Discharge: 2017-04-25 | Disposition: A | Discharge status: Home / Self Care | Payer: Medicare HMO | Source: Ambulatory Visit | Attending: Interventional Cardiology | Admitting: Interventional Cardiology

## 2017-04-27 ENCOUNTER — Encounter (HOSPITAL_COMMUNITY)
Admission: RE | Admit: 2017-04-27 | Discharge: 2017-04-27 | Disposition: A | Discharge status: Home / Self Care | Payer: Medicare HMO | Source: Ambulatory Visit | Attending: Interventional Cardiology | Admitting: Interventional Cardiology

## 2017-04-29 ENCOUNTER — Encounter (HOSPITAL_COMMUNITY)
Admission: RE | Admit: 2017-04-29 | Discharge: 2017-04-29 | Disposition: A | Discharge status: Home / Self Care | Payer: Medicare HMO | Source: Ambulatory Visit | Attending: Interventional Cardiology | Admitting: Interventional Cardiology

## 2017-05-02 ENCOUNTER — Encounter (HOSPITAL_COMMUNITY)
Admission: RE | Admit: 2017-05-02 | Discharge: 2017-05-02 | Disposition: A | Discharge status: Home / Self Care | Payer: Medicare HMO | Source: Ambulatory Visit | Attending: Interventional Cardiology | Admitting: Interventional Cardiology

## 2017-05-03 DIAGNOSIS — R69 Illness, unspecified: Secondary | ICD-10-CM | POA: Diagnosis not present

## 2017-05-04 ENCOUNTER — Encounter (HOSPITAL_COMMUNITY)
Admission: RE | Admit: 2017-05-04 | Discharge: 2017-05-04 | Disposition: A | Discharge status: Home / Self Care | Payer: Medicare HMO | Source: Ambulatory Visit | Attending: Interventional Cardiology | Admitting: Interventional Cardiology

## 2017-05-06 ENCOUNTER — Encounter (HOSPITAL_COMMUNITY)
Admission: RE | Admit: 2017-05-06 | Discharge: 2017-05-06 | Disposition: A | Discharge status: Home / Self Care | Payer: Medicare HMO | Source: Ambulatory Visit | Attending: Interventional Cardiology | Admitting: Interventional Cardiology

## 2017-05-09 ENCOUNTER — Encounter (HOSPITAL_COMMUNITY)
Admission: RE | Admit: 2017-05-09 | Discharge: 2017-05-09 | Disposition: A | Discharge status: Home / Self Care | Payer: Medicare HMO | Source: Ambulatory Visit | Attending: Interventional Cardiology | Admitting: Interventional Cardiology

## 2017-05-11 ENCOUNTER — Encounter (HOSPITAL_COMMUNITY)
Admission: RE | Admit: 2017-05-11 | Discharge: 2017-05-11 | Disposition: A | Discharge status: Home / Self Care | Payer: Medicare HMO | Source: Ambulatory Visit | Attending: Interventional Cardiology | Admitting: Interventional Cardiology

## 2017-05-12 DIAGNOSIS — R69 Illness, unspecified: Secondary | ICD-10-CM | POA: Diagnosis not present

## 2017-05-12 DIAGNOSIS — I519 Heart disease, unspecified: Secondary | ICD-10-CM | POA: Diagnosis not present

## 2017-05-12 DIAGNOSIS — Z803 Family history of malignant neoplasm of breast: Secondary | ICD-10-CM | POA: Diagnosis not present

## 2017-05-12 DIAGNOSIS — N529 Male erectile dysfunction, unspecified: Secondary | ICD-10-CM | POA: Diagnosis not present

## 2017-05-12 DIAGNOSIS — R030 Elevated blood-pressure reading, without diagnosis of hypertension: Secondary | ICD-10-CM | POA: Diagnosis not present

## 2017-05-12 DIAGNOSIS — Z791 Long term (current) use of non-steroidal anti-inflammatories (NSAID): Secondary | ICD-10-CM | POA: Diagnosis not present

## 2017-05-12 DIAGNOSIS — J309 Allergic rhinitis, unspecified: Secondary | ICD-10-CM | POA: Diagnosis not present

## 2017-05-12 DIAGNOSIS — E785 Hyperlipidemia, unspecified: Secondary | ICD-10-CM | POA: Diagnosis not present

## 2017-05-12 DIAGNOSIS — H547 Unspecified visual loss: Secondary | ICD-10-CM | POA: Diagnosis not present

## 2017-05-13 ENCOUNTER — Encounter (HOSPITAL_COMMUNITY): Payer: Medicare HMO

## 2017-05-16 ENCOUNTER — Encounter (HOSPITAL_COMMUNITY)
Admission: RE | Admit: 2017-05-16 | Discharge: 2017-05-16 | Disposition: A | Discharge status: Home / Self Care | Payer: Medicare HMO | Source: Ambulatory Visit | Attending: Interventional Cardiology | Admitting: Interventional Cardiology

## 2017-05-18 ENCOUNTER — Encounter (HOSPITAL_COMMUNITY)
Admission: RE | Admit: 2017-05-18 | Discharge: 2017-05-18 | Disposition: A | Discharge status: Home / Self Care | Payer: Medicare HMO | Source: Ambulatory Visit | Attending: Interventional Cardiology | Admitting: Interventional Cardiology

## 2017-05-20 ENCOUNTER — Encounter (HOSPITAL_COMMUNITY)
Admission: RE | Admit: 2017-05-20 | Discharge: 2017-05-20 | Disposition: A | Discharge status: Home / Self Care | Payer: Medicare HMO | Source: Ambulatory Visit | Attending: Interventional Cardiology | Admitting: Interventional Cardiology

## 2017-05-23 ENCOUNTER — Encounter (HOSPITAL_COMMUNITY)
Admission: RE | Admit: 2017-05-23 | Discharge: 2017-05-23 | Disposition: A | Discharge status: Home / Self Care | Payer: Medicare HMO | Source: Ambulatory Visit | Attending: Interventional Cardiology | Admitting: Interventional Cardiology

## 2017-05-23 DIAGNOSIS — Z9889 Other specified postprocedural states: Secondary | ICD-10-CM | POA: Insufficient documentation

## 2017-05-25 ENCOUNTER — Encounter (HOSPITAL_COMMUNITY)
Admission: RE | Admit: 2017-05-25 | Discharge: 2017-05-25 | Disposition: A | Discharge status: Home / Self Care | Payer: Medicare HMO | Source: Ambulatory Visit | Attending: Interventional Cardiology | Admitting: Interventional Cardiology

## 2017-05-27 ENCOUNTER — Encounter (HOSPITAL_COMMUNITY)
Admission: RE | Admit: 2017-05-27 | Discharge: 2017-05-27 | Disposition: A | Discharge status: Home / Self Care | Payer: Medicare HMO | Source: Ambulatory Visit | Attending: Interventional Cardiology | Admitting: Interventional Cardiology

## 2017-05-30 ENCOUNTER — Encounter (HOSPITAL_COMMUNITY)
Admission: RE | Admit: 2017-05-30 | Discharge: 2017-05-30 | Disposition: A | Discharge status: Home / Self Care | Payer: Medicare HMO | Source: Ambulatory Visit | Attending: Interventional Cardiology | Admitting: Interventional Cardiology

## 2017-06-01 ENCOUNTER — Encounter (HOSPITAL_COMMUNITY)
Admission: RE | Admit: 2017-06-01 | Discharge: 2017-06-01 | Disposition: A | Discharge status: Home / Self Care | Payer: Medicare HMO | Source: Ambulatory Visit | Attending: Interventional Cardiology | Admitting: Interventional Cardiology

## 2017-06-02 DIAGNOSIS — R69 Illness, unspecified: Secondary | ICD-10-CM | POA: Diagnosis not present

## 2017-06-03 ENCOUNTER — Encounter (HOSPITAL_COMMUNITY): Payer: Medicare HMO

## 2017-06-06 ENCOUNTER — Encounter (HOSPITAL_COMMUNITY)
Admission: RE | Admit: 2017-06-06 | Discharge: 2017-06-06 | Disposition: A | Discharge status: Home / Self Care | Payer: Medicare HMO | Source: Ambulatory Visit | Attending: Interventional Cardiology | Admitting: Interventional Cardiology

## 2017-06-08 ENCOUNTER — Encounter (HOSPITAL_COMMUNITY)
Admission: RE | Admit: 2017-06-08 | Discharge: 2017-06-08 | Disposition: A | Discharge status: Home / Self Care | Payer: Medicare HMO | Source: Ambulatory Visit | Attending: Interventional Cardiology | Admitting: Interventional Cardiology

## 2017-06-10 ENCOUNTER — Encounter (HOSPITAL_COMMUNITY)
Admission: RE | Admit: 2017-06-10 | Discharge: 2017-06-10 | Disposition: A | Discharge status: Home / Self Care | Payer: Medicare HMO | Source: Ambulatory Visit | Attending: Interventional Cardiology | Admitting: Interventional Cardiology

## 2017-06-13 ENCOUNTER — Encounter (HOSPITAL_COMMUNITY)
Admission: RE | Admit: 2017-06-13 | Discharge: 2017-06-13 | Disposition: A | Discharge status: Home / Self Care | Payer: Medicare HMO | Source: Ambulatory Visit | Attending: Interventional Cardiology | Admitting: Interventional Cardiology

## 2017-06-15 ENCOUNTER — Encounter (HOSPITAL_COMMUNITY)
Admission: RE | Admit: 2017-06-15 | Discharge: 2017-06-15 | Disposition: A | Discharge status: Home / Self Care | Payer: Medicare HMO | Source: Ambulatory Visit | Attending: Interventional Cardiology | Admitting: Interventional Cardiology

## 2017-06-17 ENCOUNTER — Encounter (HOSPITAL_COMMUNITY)
Admission: RE | Admit: 2017-06-17 | Discharge: 2017-06-17 | Disposition: A | Discharge status: Home / Self Care | Payer: Medicare HMO | Source: Ambulatory Visit | Attending: Interventional Cardiology | Admitting: Interventional Cardiology

## 2017-06-20 ENCOUNTER — Encounter (HOSPITAL_COMMUNITY)
Admission: RE | Admit: 2017-06-20 | Discharge: 2017-06-20 | Disposition: A | Discharge status: Home / Self Care | Payer: Medicare HMO | Source: Ambulatory Visit | Attending: Interventional Cardiology | Admitting: Interventional Cardiology

## 2017-06-20 DIAGNOSIS — R69 Illness, unspecified: Secondary | ICD-10-CM | POA: Diagnosis not present

## 2017-06-20 DIAGNOSIS — E7800 Pure hypercholesterolemia, unspecified: Secondary | ICD-10-CM | POA: Diagnosis not present

## 2017-06-20 DIAGNOSIS — I4891 Unspecified atrial fibrillation: Secondary | ICD-10-CM | POA: Diagnosis not present

## 2017-06-20 DIAGNOSIS — I2510 Atherosclerotic heart disease of native coronary artery without angina pectoris: Secondary | ICD-10-CM | POA: Diagnosis not present

## 2017-06-21 DIAGNOSIS — H52223 Regular astigmatism, bilateral: Secondary | ICD-10-CM | POA: Diagnosis not present

## 2017-06-21 DIAGNOSIS — H524 Presbyopia: Secondary | ICD-10-CM | POA: Diagnosis not present

## 2017-06-21 DIAGNOSIS — H2513 Age-related nuclear cataract, bilateral: Secondary | ICD-10-CM | POA: Diagnosis not present

## 2017-06-21 DIAGNOSIS — H5213 Myopia, bilateral: Secondary | ICD-10-CM | POA: Diagnosis not present

## 2017-06-22 ENCOUNTER — Encounter (HOSPITAL_COMMUNITY)
Admission: RE | Admit: 2017-06-22 | Discharge: 2017-06-22 | Disposition: A | Discharge status: Home / Self Care | Payer: Self-pay | Source: Ambulatory Visit | Attending: Interventional Cardiology | Admitting: Interventional Cardiology

## 2017-06-22 DIAGNOSIS — Z9889 Other specified postprocedural states: Secondary | ICD-10-CM | POA: Insufficient documentation

## 2017-06-24 ENCOUNTER — Encounter (HOSPITAL_COMMUNITY)
Admission: RE | Admit: 2017-06-24 | Discharge: 2017-06-24 | Disposition: A | Discharge status: Home / Self Care | Payer: Self-pay | Source: Ambulatory Visit | Attending: Interventional Cardiology | Admitting: Interventional Cardiology

## 2017-06-27 ENCOUNTER — Encounter (HOSPITAL_COMMUNITY)
Admission: RE | Admit: 2017-06-27 | Discharge: 2017-06-27 | Disposition: A | Discharge status: Home / Self Care | Payer: Self-pay | Source: Ambulatory Visit | Attending: Interventional Cardiology | Admitting: Interventional Cardiology

## 2017-06-29 ENCOUNTER — Encounter (HOSPITAL_COMMUNITY)
Admission: RE | Admit: 2017-06-29 | Discharge: 2017-06-29 | Disposition: A | Discharge status: Home / Self Care | Payer: Self-pay | Source: Ambulatory Visit | Attending: Interventional Cardiology | Admitting: Interventional Cardiology

## 2017-07-01 ENCOUNTER — Encounter (HOSPITAL_COMMUNITY)
Admission: RE | Admit: 2017-07-01 | Discharge: 2017-07-01 | Disposition: A | Discharge status: Home / Self Care | Payer: Self-pay | Source: Ambulatory Visit | Attending: Interventional Cardiology | Admitting: Interventional Cardiology

## 2017-07-04 ENCOUNTER — Encounter (HOSPITAL_COMMUNITY): Payer: Self-pay

## 2017-07-06 ENCOUNTER — Encounter (HOSPITAL_COMMUNITY)
Admission: RE | Admit: 2017-07-06 | Discharge: 2017-07-06 | Disposition: A | Discharge status: Home / Self Care | Payer: Self-pay | Source: Ambulatory Visit | Attending: Interventional Cardiology | Admitting: Interventional Cardiology

## 2017-07-08 ENCOUNTER — Encounter (HOSPITAL_COMMUNITY): Payer: Self-pay

## 2017-07-11 ENCOUNTER — Encounter (HOSPITAL_COMMUNITY)
Admission: RE | Admit: 2017-07-11 | Discharge: 2017-07-11 | Disposition: A | Discharge status: Home / Self Care | Payer: Self-pay | Source: Ambulatory Visit | Attending: Interventional Cardiology | Admitting: Interventional Cardiology

## 2017-07-13 ENCOUNTER — Encounter (HOSPITAL_COMMUNITY): Payer: Self-pay

## 2017-07-15 ENCOUNTER — Encounter (HOSPITAL_COMMUNITY): Payer: Self-pay

## 2017-07-20 ENCOUNTER — Encounter (HOSPITAL_COMMUNITY): Payer: Self-pay

## 2017-07-22 ENCOUNTER — Encounter (HOSPITAL_COMMUNITY)
Admission: RE | Admit: 2017-07-22 | Discharge: 2017-07-22 | Disposition: A | Discharge status: Home / Self Care | Payer: Medicare HMO | Source: Ambulatory Visit | Attending: Interventional Cardiology | Admitting: Interventional Cardiology

## 2017-07-25 ENCOUNTER — Encounter (HOSPITAL_COMMUNITY)
Admission: RE | Admit: 2017-07-25 | Discharge: 2017-07-25 | Disposition: A | Discharge status: Home / Self Care | Payer: Self-pay | Source: Ambulatory Visit | Attending: Interventional Cardiology | Admitting: Interventional Cardiology

## 2017-07-25 DIAGNOSIS — Z9889 Other specified postprocedural states: Secondary | ICD-10-CM | POA: Insufficient documentation

## 2017-07-27 ENCOUNTER — Encounter (HOSPITAL_COMMUNITY)
Admission: RE | Admit: 2017-07-27 | Discharge: 2017-07-27 | Disposition: A | Discharge status: Home / Self Care | Payer: Self-pay | Source: Ambulatory Visit | Attending: Interventional Cardiology | Admitting: Interventional Cardiology

## 2017-07-29 ENCOUNTER — Encounter (HOSPITAL_COMMUNITY)
Admission: RE | Admit: 2017-07-29 | Discharge: 2017-07-29 | Disposition: A | Discharge status: Home / Self Care | Payer: Self-pay | Source: Ambulatory Visit | Attending: Interventional Cardiology | Admitting: Interventional Cardiology

## 2017-08-01 ENCOUNTER — Encounter (HOSPITAL_COMMUNITY)
Admission: RE | Admit: 2017-08-01 | Discharge: 2017-08-01 | Disposition: A | Discharge status: Home / Self Care | Payer: Self-pay | Source: Ambulatory Visit | Attending: Interventional Cardiology | Admitting: Interventional Cardiology

## 2017-08-03 ENCOUNTER — Encounter (HOSPITAL_COMMUNITY)
Admission: RE | Admit: 2017-08-03 | Discharge: 2017-08-03 | Disposition: A | Discharge status: Home / Self Care | Payer: Self-pay | Source: Ambulatory Visit | Attending: Interventional Cardiology | Admitting: Interventional Cardiology

## 2017-08-05 ENCOUNTER — Encounter (HOSPITAL_COMMUNITY)
Admission: RE | Admit: 2017-08-05 | Discharge: 2017-08-05 | Disposition: A | Discharge status: Home / Self Care | Payer: Self-pay | Source: Ambulatory Visit | Attending: Interventional Cardiology | Admitting: Interventional Cardiology

## 2017-08-08 ENCOUNTER — Encounter (HOSPITAL_COMMUNITY)
Admission: RE | Admit: 2017-08-08 | Discharge: 2017-08-08 | Disposition: A | Discharge status: Home / Self Care | Payer: Self-pay | Source: Ambulatory Visit | Attending: Interventional Cardiology | Admitting: Interventional Cardiology

## 2017-08-10 ENCOUNTER — Encounter (HOSPITAL_COMMUNITY)
Admission: RE | Admit: 2017-08-10 | Discharge: 2017-08-10 | Disposition: A | Discharge status: Home / Self Care | Payer: Medicare HMO | Source: Ambulatory Visit | Attending: Interventional Cardiology | Admitting: Interventional Cardiology

## 2017-08-12 ENCOUNTER — Encounter (HOSPITAL_COMMUNITY)
Admission: RE | Admit: 2017-08-12 | Discharge: 2017-08-12 | Disposition: A | Discharge status: Home / Self Care | Payer: Self-pay | Source: Ambulatory Visit | Attending: Interventional Cardiology | Admitting: Interventional Cardiology

## 2017-08-15 ENCOUNTER — Encounter (HOSPITAL_COMMUNITY)
Admission: RE | Admit: 2017-08-15 | Discharge: 2017-08-15 | Disposition: A | Discharge status: Home / Self Care | Payer: Medicare HMO | Source: Ambulatory Visit | Attending: Interventional Cardiology | Admitting: Interventional Cardiology

## 2017-08-17 ENCOUNTER — Encounter (HOSPITAL_COMMUNITY)
Admission: RE | Admit: 2017-08-17 | Discharge: 2017-08-17 | Disposition: A | Discharge status: Home / Self Care | Payer: Medicare HMO | Source: Ambulatory Visit | Attending: Interventional Cardiology | Admitting: Interventional Cardiology

## 2017-08-19 ENCOUNTER — Encounter (HOSPITAL_COMMUNITY)
Admission: RE | Admit: 2017-08-19 | Discharge: 2017-08-19 | Disposition: A | Discharge status: Home / Self Care | Payer: Medicare HMO | Source: Ambulatory Visit | Attending: Interventional Cardiology | Admitting: Interventional Cardiology

## 2017-08-22 ENCOUNTER — Encounter (HOSPITAL_COMMUNITY)
Admission: RE | Admit: 2017-08-22 | Discharge: 2017-08-22 | Disposition: A | Discharge status: Home / Self Care | Payer: Self-pay | Source: Ambulatory Visit | Attending: Interventional Cardiology | Admitting: Interventional Cardiology

## 2017-08-22 DIAGNOSIS — Z9889 Other specified postprocedural states: Secondary | ICD-10-CM | POA: Insufficient documentation

## 2017-08-24 ENCOUNTER — Encounter (HOSPITAL_COMMUNITY)
Admission: RE | Admit: 2017-08-24 | Discharge: 2017-08-24 | Disposition: A | Discharge status: Home / Self Care | Payer: Self-pay | Source: Ambulatory Visit | Attending: Interventional Cardiology | Admitting: Interventional Cardiology

## 2017-08-26 ENCOUNTER — Encounter (HOSPITAL_COMMUNITY)
Admission: RE | Admit: 2017-08-26 | Discharge: 2017-08-26 | Disposition: A | Discharge status: Home / Self Care | Payer: Self-pay | Source: Ambulatory Visit | Attending: Interventional Cardiology | Admitting: Interventional Cardiology

## 2017-08-29 ENCOUNTER — Encounter (HOSPITAL_COMMUNITY)
Admission: RE | Admit: 2017-08-29 | Discharge: 2017-08-29 | Disposition: A | Discharge status: Home / Self Care | Payer: Self-pay | Source: Ambulatory Visit | Attending: Interventional Cardiology | Admitting: Interventional Cardiology

## 2017-08-31 ENCOUNTER — Encounter (HOSPITAL_COMMUNITY)
Admission: RE | Admit: 2017-08-31 | Discharge: 2017-08-31 | Disposition: A | Discharge status: Home / Self Care | Payer: Self-pay | Source: Ambulatory Visit | Attending: Interventional Cardiology | Admitting: Interventional Cardiology

## 2017-09-02 ENCOUNTER — Encounter (HOSPITAL_COMMUNITY)
Admission: RE | Admit: 2017-09-02 | Discharge: 2017-09-02 | Disposition: A | Discharge status: Home / Self Care | Payer: Self-pay | Source: Ambulatory Visit | Attending: Interventional Cardiology | Admitting: Interventional Cardiology

## 2017-09-03 ENCOUNTER — Other Ambulatory Visit: Payer: Self-pay | Admitting: Interventional Cardiology

## 2017-09-05 ENCOUNTER — Encounter (HOSPITAL_COMMUNITY)
Admission: RE | Admit: 2017-09-05 | Discharge: 2017-09-05 | Disposition: A | Discharge status: Home / Self Care | Payer: Self-pay | Source: Ambulatory Visit | Attending: Interventional Cardiology | Admitting: Interventional Cardiology

## 2017-09-06 DIAGNOSIS — H18413 Arcus senilis, bilateral: Secondary | ICD-10-CM | POA: Diagnosis not present

## 2017-09-06 DIAGNOSIS — H25043 Posterior subcapsular polar age-related cataract, bilateral: Secondary | ICD-10-CM | POA: Diagnosis not present

## 2017-09-06 DIAGNOSIS — H02831 Dermatochalasis of right upper eyelid: Secondary | ICD-10-CM | POA: Diagnosis not present

## 2017-09-06 DIAGNOSIS — H02834 Dermatochalasis of left upper eyelid: Secondary | ICD-10-CM | POA: Diagnosis not present

## 2017-09-06 DIAGNOSIS — H2513 Age-related nuclear cataract, bilateral: Secondary | ICD-10-CM | POA: Diagnosis not present

## 2017-09-06 DIAGNOSIS — H25013 Cortical age-related cataract, bilateral: Secondary | ICD-10-CM | POA: Diagnosis not present

## 2017-09-07 ENCOUNTER — Encounter (HOSPITAL_COMMUNITY)
Admission: RE | Admit: 2017-09-07 | Discharge: 2017-09-07 | Disposition: A | Discharge status: Home / Self Care | Payer: Self-pay | Source: Ambulatory Visit | Attending: Interventional Cardiology | Admitting: Interventional Cardiology

## 2017-09-09 ENCOUNTER — Encounter (HOSPITAL_COMMUNITY)
Admission: RE | Admit: 2017-09-09 | Discharge: 2017-09-09 | Disposition: A | Discharge status: Home / Self Care | Payer: Self-pay | Source: Ambulatory Visit | Attending: Interventional Cardiology | Admitting: Interventional Cardiology

## 2017-09-12 ENCOUNTER — Encounter (HOSPITAL_COMMUNITY)
Admission: RE | Admit: 2017-09-12 | Discharge: 2017-09-12 | Disposition: A | Discharge status: Home / Self Care | Payer: Self-pay | Source: Ambulatory Visit | Attending: Interventional Cardiology | Admitting: Interventional Cardiology

## 2017-09-14 ENCOUNTER — Encounter (HOSPITAL_COMMUNITY)
Admission: RE | Admit: 2017-09-14 | Discharge: 2017-09-14 | Disposition: A | Discharge status: Home / Self Care | Payer: Self-pay | Source: Ambulatory Visit | Attending: Interventional Cardiology | Admitting: Interventional Cardiology

## 2017-09-16 ENCOUNTER — Encounter (HOSPITAL_COMMUNITY)
Admission: RE | Admit: 2017-09-16 | Discharge: 2017-09-16 | Disposition: A | Discharge status: Home / Self Care | Payer: Self-pay | Source: Ambulatory Visit | Attending: Interventional Cardiology | Admitting: Interventional Cardiology

## 2017-09-19 ENCOUNTER — Encounter (HOSPITAL_COMMUNITY)
Admission: RE | Admit: 2017-09-19 | Discharge: 2017-09-19 | Disposition: A | Discharge status: Home / Self Care | Payer: Self-pay | Source: Ambulatory Visit | Attending: Interventional Cardiology | Admitting: Interventional Cardiology

## 2017-09-20 DIAGNOSIS — R69 Illness, unspecified: Secondary | ICD-10-CM | POA: Diagnosis not present

## 2017-09-21 ENCOUNTER — Encounter (HOSPITAL_COMMUNITY)
Admission: RE | Admit: 2017-09-21 | Discharge: 2017-09-21 | Disposition: A | Discharge status: Home / Self Care | Payer: Self-pay | Source: Ambulatory Visit | Attending: Interventional Cardiology | Admitting: Interventional Cardiology

## 2017-09-23 ENCOUNTER — Encounter (HOSPITAL_COMMUNITY)
Admission: RE | Admit: 2017-09-23 | Discharge: 2017-09-23 | Disposition: A | Discharge status: Home / Self Care | Payer: Medicare HMO | Source: Ambulatory Visit | Attending: Interventional Cardiology | Admitting: Interventional Cardiology

## 2017-09-23 DIAGNOSIS — Z9889 Other specified postprocedural states: Secondary | ICD-10-CM | POA: Insufficient documentation

## 2017-09-26 ENCOUNTER — Encounter (HOSPITAL_COMMUNITY)
Admission: RE | Admit: 2017-09-26 | Discharge: 2017-09-26 | Disposition: A | Discharge status: Home / Self Care | Payer: Medicare HMO | Source: Ambulatory Visit | Attending: Interventional Cardiology | Admitting: Interventional Cardiology

## 2017-09-28 ENCOUNTER — Encounter (HOSPITAL_COMMUNITY)
Admission: RE | Admit: 2017-09-28 | Discharge: 2017-09-28 | Disposition: A | Discharge status: Home / Self Care | Payer: Medicare HMO | Source: Ambulatory Visit | Attending: Interventional Cardiology | Admitting: Interventional Cardiology

## 2017-09-30 ENCOUNTER — Encounter (HOSPITAL_COMMUNITY): Payer: Medicare HMO

## 2017-10-03 ENCOUNTER — Encounter (HOSPITAL_COMMUNITY): Payer: Medicare HMO

## 2017-10-04 ENCOUNTER — Other Ambulatory Visit (HOSPITAL_COMMUNITY): Payer: Medicare HMO

## 2017-10-05 ENCOUNTER — Encounter (HOSPITAL_COMMUNITY): Payer: Medicare HMO

## 2017-10-07 ENCOUNTER — Encounter (HOSPITAL_COMMUNITY)
Admission: RE | Admit: 2017-10-07 | Discharge: 2017-10-07 | Disposition: A | Discharge status: Home / Self Care | Payer: Medicare HMO | Source: Ambulatory Visit | Attending: Interventional Cardiology | Admitting: Interventional Cardiology

## 2017-10-10 ENCOUNTER — Encounter (HOSPITAL_COMMUNITY)
Admission: RE | Admit: 2017-10-10 | Discharge: 2017-10-10 | Disposition: A | Discharge status: Home / Self Care | Payer: Medicare HMO | Source: Ambulatory Visit | Attending: Interventional Cardiology | Admitting: Interventional Cardiology

## 2017-10-12 ENCOUNTER — Encounter (HOSPITAL_COMMUNITY)
Admission: RE | Admit: 2017-10-12 | Discharge: 2017-10-12 | Disposition: A | Discharge status: Home / Self Care | Payer: Medicare HMO | Source: Ambulatory Visit | Attending: Interventional Cardiology | Admitting: Interventional Cardiology

## 2017-10-13 ENCOUNTER — Ambulatory Visit (HOSPITAL_COMMUNITY): Payer: Medicare HMO | Attending: Cardiovascular Disease

## 2017-10-13 ENCOUNTER — Encounter (INDEPENDENT_AMBULATORY_CARE_PROVIDER_SITE_OTHER): Payer: Self-pay

## 2017-10-13 ENCOUNTER — Other Ambulatory Visit: Payer: Self-pay

## 2017-10-13 DIAGNOSIS — I10 Essential (primary) hypertension: Secondary | ICD-10-CM | POA: Insufficient documentation

## 2017-10-13 DIAGNOSIS — I2510 Atherosclerotic heart disease of native coronary artery without angina pectoris: Secondary | ICD-10-CM | POA: Insufficient documentation

## 2017-10-13 DIAGNOSIS — E785 Hyperlipidemia, unspecified: Secondary | ICD-10-CM | POA: Insufficient documentation

## 2017-10-13 DIAGNOSIS — Z9889 Other specified postprocedural states: Secondary | ICD-10-CM | POA: Diagnosis not present

## 2017-10-14 ENCOUNTER — Encounter (HOSPITAL_COMMUNITY)
Admission: RE | Admit: 2017-10-14 | Discharge: 2017-10-14 | Disposition: A | Discharge status: Home / Self Care | Payer: Medicare HMO | Source: Ambulatory Visit | Attending: Interventional Cardiology | Admitting: Interventional Cardiology

## 2017-10-17 ENCOUNTER — Encounter (HOSPITAL_COMMUNITY)
Admission: RE | Admit: 2017-10-17 | Discharge: 2017-10-17 | Disposition: A | Discharge status: Home / Self Care | Payer: Medicare HMO | Source: Ambulatory Visit | Attending: Interventional Cardiology | Admitting: Interventional Cardiology

## 2017-10-19 ENCOUNTER — Encounter (HOSPITAL_COMMUNITY)
Admission: RE | Admit: 2017-10-19 | Discharge: 2017-10-19 | Disposition: A | Discharge status: Home / Self Care | Payer: Medicare HMO | Source: Ambulatory Visit | Attending: Interventional Cardiology | Admitting: Interventional Cardiology

## 2017-10-21 ENCOUNTER — Encounter (HOSPITAL_COMMUNITY): Payer: Medicare HMO

## 2017-10-26 ENCOUNTER — Encounter (HOSPITAL_COMMUNITY): Payer: Self-pay

## 2017-10-26 DIAGNOSIS — Z9889 Other specified postprocedural states: Secondary | ICD-10-CM | POA: Insufficient documentation

## 2017-10-28 ENCOUNTER — Encounter (HOSPITAL_COMMUNITY): Payer: Self-pay

## 2017-10-28 DIAGNOSIS — H5212 Myopia, left eye: Secondary | ICD-10-CM | POA: Diagnosis not present

## 2017-10-28 DIAGNOSIS — Z9841 Cataract extraction status, right eye: Secondary | ICD-10-CM | POA: Diagnosis not present

## 2017-10-28 DIAGNOSIS — H52223 Regular astigmatism, bilateral: Secondary | ICD-10-CM | POA: Diagnosis not present

## 2017-10-28 DIAGNOSIS — H2512 Age-related nuclear cataract, left eye: Secondary | ICD-10-CM | POA: Diagnosis not present

## 2017-10-28 DIAGNOSIS — Z961 Presence of intraocular lens: Secondary | ICD-10-CM | POA: Diagnosis not present

## 2017-10-28 DIAGNOSIS — H2511 Age-related nuclear cataract, right eye: Secondary | ICD-10-CM | POA: Diagnosis not present

## 2017-10-31 ENCOUNTER — Encounter (HOSPITAL_COMMUNITY): Payer: Self-pay

## 2017-11-02 ENCOUNTER — Encounter (HOSPITAL_COMMUNITY)
Admission: RE | Admit: 2017-11-02 | Discharge: 2017-11-02 | Disposition: A | Discharge status: Home / Self Care | Payer: Self-pay | Source: Ambulatory Visit | Attending: Interventional Cardiology | Admitting: Interventional Cardiology

## 2017-11-04 ENCOUNTER — Encounter (HOSPITAL_COMMUNITY)
Admission: RE | Admit: 2017-11-04 | Discharge: 2017-11-04 | Disposition: A | Discharge status: Home / Self Care | Payer: Self-pay | Source: Ambulatory Visit | Attending: Interventional Cardiology | Admitting: Interventional Cardiology

## 2017-11-07 ENCOUNTER — Encounter (HOSPITAL_COMMUNITY)
Admission: RE | Admit: 2017-11-07 | Discharge: 2017-11-07 | Disposition: A | Discharge status: Home / Self Care | Payer: Self-pay | Source: Ambulatory Visit | Attending: Interventional Cardiology | Admitting: Interventional Cardiology

## 2017-11-09 ENCOUNTER — Encounter (HOSPITAL_COMMUNITY)
Admission: RE | Admit: 2017-11-09 | Discharge: 2017-11-09 | Disposition: A | Discharge status: Home / Self Care | Payer: Self-pay | Source: Ambulatory Visit | Attending: Interventional Cardiology | Admitting: Interventional Cardiology

## 2017-11-11 ENCOUNTER — Encounter (HOSPITAL_COMMUNITY)
Admission: RE | Admit: 2017-11-11 | Discharge: 2017-11-11 | Disposition: A | Discharge status: Home / Self Care | Payer: Medicare HMO | Source: Ambulatory Visit | Attending: Interventional Cardiology | Admitting: Interventional Cardiology

## 2017-11-14 ENCOUNTER — Encounter (HOSPITAL_COMMUNITY)
Admission: RE | Admit: 2017-11-14 | Discharge: 2017-11-14 | Disposition: A | Discharge status: Home / Self Care | Payer: Medicare HMO | Source: Ambulatory Visit | Attending: Interventional Cardiology | Admitting: Interventional Cardiology

## 2017-11-14 DIAGNOSIS — Z23 Encounter for immunization: Secondary | ICD-10-CM | POA: Diagnosis not present

## 2017-11-16 ENCOUNTER — Encounter (HOSPITAL_COMMUNITY)
Admission: RE | Admit: 2017-11-16 | Discharge: 2017-11-16 | Disposition: A | Discharge status: Home / Self Care | Payer: Self-pay | Source: Ambulatory Visit | Attending: Interventional Cardiology | Admitting: Interventional Cardiology

## 2017-11-18 ENCOUNTER — Encounter (HOSPITAL_COMMUNITY): Payer: Self-pay

## 2017-11-18 DIAGNOSIS — H2512 Age-related nuclear cataract, left eye: Secondary | ICD-10-CM | POA: Diagnosis not present

## 2017-11-21 ENCOUNTER — Encounter (HOSPITAL_COMMUNITY)
Admission: RE | Admit: 2017-11-21 | Discharge: 2017-11-21 | Disposition: A | Discharge status: Home / Self Care | Payer: Self-pay | Source: Ambulatory Visit | Attending: Interventional Cardiology | Admitting: Interventional Cardiology

## 2017-11-21 DIAGNOSIS — H5213 Myopia, bilateral: Secondary | ICD-10-CM | POA: Diagnosis not present

## 2017-11-21 DIAGNOSIS — Z961 Presence of intraocular lens: Secondary | ICD-10-CM | POA: Diagnosis not present

## 2017-11-21 DIAGNOSIS — H2512 Age-related nuclear cataract, left eye: Secondary | ICD-10-CM | POA: Diagnosis not present

## 2017-11-21 DIAGNOSIS — H52222 Regular astigmatism, left eye: Secondary | ICD-10-CM | POA: Diagnosis not present

## 2017-11-21 DIAGNOSIS — Z9842 Cataract extraction status, left eye: Secondary | ICD-10-CM | POA: Diagnosis not present

## 2017-11-21 DIAGNOSIS — Z9841 Cataract extraction status, right eye: Secondary | ICD-10-CM | POA: Diagnosis not present

## 2017-11-23 ENCOUNTER — Encounter (HOSPITAL_COMMUNITY): Payer: Self-pay

## 2017-11-23 DIAGNOSIS — Z9889 Other specified postprocedural states: Secondary | ICD-10-CM | POA: Insufficient documentation

## 2017-11-25 ENCOUNTER — Encounter (HOSPITAL_COMMUNITY)
Admission: RE | Admit: 2017-11-25 | Discharge: 2017-11-25 | Disposition: A | Discharge status: Home / Self Care | Payer: Self-pay | Source: Ambulatory Visit | Attending: Interventional Cardiology | Admitting: Interventional Cardiology

## 2017-11-28 ENCOUNTER — Encounter (HOSPITAL_COMMUNITY)
Admission: RE | Admit: 2017-11-28 | Discharge: 2017-11-28 | Disposition: A | Discharge status: Home / Self Care | Payer: Self-pay | Source: Ambulatory Visit | Attending: Interventional Cardiology | Admitting: Interventional Cardiology

## 2017-11-30 ENCOUNTER — Encounter (HOSPITAL_COMMUNITY)
Admission: RE | Admit: 2017-11-30 | Discharge: 2017-11-30 | Disposition: A | Discharge status: Home / Self Care | Payer: Self-pay | Source: Ambulatory Visit | Attending: Interventional Cardiology | Admitting: Interventional Cardiology

## 2017-12-02 ENCOUNTER — Encounter (HOSPITAL_COMMUNITY)
Admission: RE | Admit: 2017-12-02 | Discharge: 2017-12-02 | Disposition: A | Discharge status: Home / Self Care | Payer: Self-pay | Source: Ambulatory Visit | Attending: Interventional Cardiology | Admitting: Interventional Cardiology

## 2017-12-05 ENCOUNTER — Encounter (HOSPITAL_COMMUNITY)
Admission: RE | Admit: 2017-12-05 | Discharge: 2017-12-05 | Disposition: A | Discharge status: Home / Self Care | Payer: Medicare HMO | Source: Ambulatory Visit | Attending: Interventional Cardiology | Admitting: Interventional Cardiology

## 2017-12-07 ENCOUNTER — Encounter (HOSPITAL_COMMUNITY)
Admission: RE | Admit: 2017-12-07 | Discharge: 2017-12-07 | Disposition: A | Discharge status: Home / Self Care | Payer: Medicare HMO | Source: Ambulatory Visit | Attending: Interventional Cardiology | Admitting: Interventional Cardiology

## 2017-12-09 ENCOUNTER — Encounter (HOSPITAL_COMMUNITY)
Admission: RE | Admit: 2017-12-09 | Discharge: 2017-12-09 | Disposition: A | Discharge status: Home / Self Care | Payer: Medicare HMO | Source: Ambulatory Visit | Attending: Interventional Cardiology | Admitting: Interventional Cardiology

## 2017-12-12 ENCOUNTER — Encounter (HOSPITAL_COMMUNITY)
Admission: RE | Admit: 2017-12-12 | Discharge: 2017-12-12 | Disposition: A | Discharge status: Home / Self Care | Payer: Self-pay | Source: Ambulatory Visit | Attending: Interventional Cardiology | Admitting: Interventional Cardiology

## 2017-12-14 ENCOUNTER — Encounter (HOSPITAL_COMMUNITY)
Admission: RE | Admit: 2017-12-14 | Discharge: 2017-12-14 | Disposition: A | Discharge status: Home / Self Care | Payer: Self-pay | Source: Ambulatory Visit | Attending: Interventional Cardiology | Admitting: Interventional Cardiology

## 2017-12-16 ENCOUNTER — Encounter (HOSPITAL_COMMUNITY)
Admission: RE | Admit: 2017-12-16 | Discharge: 2017-12-16 | Disposition: A | Discharge status: Home / Self Care | Payer: Self-pay | Source: Ambulatory Visit | Attending: Interventional Cardiology | Admitting: Interventional Cardiology

## 2017-12-19 ENCOUNTER — Encounter (HOSPITAL_COMMUNITY)
Admission: RE | Admit: 2017-12-19 | Discharge: 2017-12-19 | Disposition: A | Discharge status: Home / Self Care | Payer: Self-pay | Source: Ambulatory Visit | Attending: Interventional Cardiology | Admitting: Interventional Cardiology

## 2017-12-19 DIAGNOSIS — E7800 Pure hypercholesterolemia, unspecified: Secondary | ICD-10-CM | POA: Diagnosis not present

## 2017-12-19 DIAGNOSIS — F329 Major depressive disorder, single episode, unspecified: Secondary | ICD-10-CM | POA: Diagnosis not present

## 2017-12-19 DIAGNOSIS — I4891 Unspecified atrial fibrillation: Secondary | ICD-10-CM | POA: Diagnosis not present

## 2017-12-19 DIAGNOSIS — R69 Illness, unspecified: Secondary | ICD-10-CM | POA: Diagnosis not present

## 2017-12-19 DIAGNOSIS — I2510 Atherosclerotic heart disease of native coronary artery without angina pectoris: Secondary | ICD-10-CM | POA: Diagnosis not present

## 2017-12-21 ENCOUNTER — Encounter (HOSPITAL_COMMUNITY)
Admission: RE | Admit: 2017-12-21 | Discharge: 2017-12-21 | Disposition: A | Discharge status: Home / Self Care | Payer: Self-pay | Source: Ambulatory Visit | Attending: Interventional Cardiology | Admitting: Interventional Cardiology

## 2017-12-23 ENCOUNTER — Encounter (HOSPITAL_COMMUNITY)
Admission: RE | Admit: 2017-12-23 | Discharge: 2017-12-23 | Disposition: A | Discharge status: Home / Self Care | Payer: Self-pay | Source: Ambulatory Visit | Attending: Interventional Cardiology | Admitting: Interventional Cardiology

## 2017-12-23 DIAGNOSIS — Z9889 Other specified postprocedural states: Secondary | ICD-10-CM | POA: Insufficient documentation

## 2017-12-26 ENCOUNTER — Encounter (HOSPITAL_COMMUNITY)
Admission: RE | Admit: 2017-12-26 | Discharge: 2017-12-26 | Disposition: A | Discharge status: Home / Self Care | Payer: Self-pay | Source: Ambulatory Visit | Attending: Interventional Cardiology | Admitting: Interventional Cardiology

## 2017-12-28 ENCOUNTER — Encounter (HOSPITAL_COMMUNITY)
Admission: RE | Admit: 2017-12-28 | Discharge: 2017-12-28 | Disposition: A | Discharge status: Home / Self Care | Payer: Self-pay | Source: Ambulatory Visit | Attending: Interventional Cardiology | Admitting: Interventional Cardiology

## 2017-12-30 ENCOUNTER — Encounter (HOSPITAL_COMMUNITY)
Admission: RE | Admit: 2017-12-30 | Discharge: 2017-12-30 | Disposition: A | Discharge status: Home / Self Care | Payer: Medicare HMO | Source: Ambulatory Visit | Attending: Interventional Cardiology | Admitting: Interventional Cardiology

## 2018-01-02 ENCOUNTER — Encounter (HOSPITAL_COMMUNITY)
Admission: RE | Admit: 2018-01-02 | Discharge: 2018-01-02 | Disposition: A | Discharge status: Home / Self Care | Payer: Self-pay | Source: Ambulatory Visit | Attending: Interventional Cardiology | Admitting: Interventional Cardiology

## 2018-01-03 DIAGNOSIS — R69 Illness, unspecified: Secondary | ICD-10-CM | POA: Diagnosis not present

## 2018-01-04 ENCOUNTER — Encounter (HOSPITAL_COMMUNITY)
Admission: RE | Admit: 2018-01-04 | Discharge: 2018-01-04 | Disposition: A | Discharge status: Home / Self Care | Payer: Self-pay | Source: Ambulatory Visit | Attending: Interventional Cardiology | Admitting: Interventional Cardiology

## 2018-01-06 ENCOUNTER — Encounter (HOSPITAL_COMMUNITY)
Admission: RE | Admit: 2018-01-06 | Discharge: 2018-01-06 | Disposition: A | Discharge status: Home / Self Care | Payer: Self-pay | Source: Ambulatory Visit | Attending: Interventional Cardiology | Admitting: Interventional Cardiology

## 2018-01-09 ENCOUNTER — Encounter (HOSPITAL_COMMUNITY)
Admission: RE | Admit: 2018-01-09 | Discharge: 2018-01-09 | Disposition: A | Discharge status: Home / Self Care | Payer: Medicare HMO | Source: Ambulatory Visit | Attending: Interventional Cardiology | Admitting: Interventional Cardiology

## 2018-01-11 ENCOUNTER — Encounter (HOSPITAL_COMMUNITY)
Admission: RE | Admit: 2018-01-11 | Discharge: 2018-01-11 | Disposition: A | Discharge status: Home / Self Care | Payer: Self-pay | Source: Ambulatory Visit | Attending: Interventional Cardiology | Admitting: Interventional Cardiology

## 2018-01-13 ENCOUNTER — Encounter (HOSPITAL_COMMUNITY)
Admission: RE | Admit: 2018-01-13 | Discharge: 2018-01-13 | Disposition: A | Discharge status: Home / Self Care | Payer: Self-pay | Source: Ambulatory Visit | Attending: Interventional Cardiology | Admitting: Interventional Cardiology

## 2018-01-16 ENCOUNTER — Encounter (HOSPITAL_COMMUNITY)
Admission: RE | Admit: 2018-01-16 | Discharge: 2018-01-16 | Disposition: A | Discharge status: Home / Self Care | Payer: Self-pay | Source: Ambulatory Visit | Attending: Interventional Cardiology | Admitting: Interventional Cardiology

## 2018-01-18 ENCOUNTER — Encounter (HOSPITAL_COMMUNITY)
Admission: RE | Admit: 2018-01-18 | Discharge: 2018-01-18 | Disposition: A | Discharge status: Home / Self Care | Payer: Medicare HMO | Source: Ambulatory Visit | Attending: Interventional Cardiology | Admitting: Interventional Cardiology

## 2018-01-23 ENCOUNTER — Encounter (HOSPITAL_COMMUNITY)
Admission: RE | Admit: 2018-01-23 | Discharge: 2018-01-23 | Disposition: A | Discharge status: Home / Self Care | Payer: Self-pay | Source: Ambulatory Visit | Attending: Interventional Cardiology | Admitting: Interventional Cardiology

## 2018-01-23 DIAGNOSIS — Z9889 Other specified postprocedural states: Secondary | ICD-10-CM | POA: Insufficient documentation

## 2018-01-25 ENCOUNTER — Encounter (HOSPITAL_COMMUNITY)
Admission: RE | Admit: 2018-01-25 | Discharge: 2018-01-25 | Disposition: A | Discharge status: Home / Self Care | Payer: Self-pay | Source: Ambulatory Visit | Attending: Interventional Cardiology | Admitting: Interventional Cardiology

## 2018-01-26 DIAGNOSIS — Z79899 Other long term (current) drug therapy: Secondary | ICD-10-CM | POA: Diagnosis not present

## 2018-01-26 DIAGNOSIS — R69 Illness, unspecified: Secondary | ICD-10-CM | POA: Diagnosis not present

## 2018-01-26 DIAGNOSIS — Z1389 Encounter for screening for other disorder: Secondary | ICD-10-CM | POA: Diagnosis not present

## 2018-01-26 DIAGNOSIS — Z0001 Encounter for general adult medical examination with abnormal findings: Secondary | ICD-10-CM | POA: Diagnosis not present

## 2018-01-26 DIAGNOSIS — I4891 Unspecified atrial fibrillation: Secondary | ICD-10-CM | POA: Diagnosis not present

## 2018-01-26 DIAGNOSIS — H6123 Impacted cerumen, bilateral: Secondary | ICD-10-CM | POA: Diagnosis not present

## 2018-01-26 DIAGNOSIS — E7800 Pure hypercholesterolemia, unspecified: Secondary | ICD-10-CM | POA: Diagnosis not present

## 2018-01-26 DIAGNOSIS — I2510 Atherosclerotic heart disease of native coronary artery without angina pectoris: Secondary | ICD-10-CM | POA: Diagnosis not present

## 2018-01-26 DIAGNOSIS — R202 Paresthesia of skin: Secondary | ICD-10-CM | POA: Diagnosis not present

## 2018-01-27 ENCOUNTER — Encounter (HOSPITAL_COMMUNITY)
Admission: RE | Admit: 2018-01-27 | Discharge: 2018-01-27 | Disposition: A | Discharge status: Home / Self Care | Payer: Self-pay | Source: Ambulatory Visit | Attending: Interventional Cardiology | Admitting: Interventional Cardiology

## 2018-01-30 ENCOUNTER — Encounter (HOSPITAL_COMMUNITY)
Admission: RE | Admit: 2018-01-30 | Discharge: 2018-01-30 | Disposition: A | Discharge status: Home / Self Care | Payer: Medicare HMO | Source: Ambulatory Visit | Attending: Interventional Cardiology | Admitting: Interventional Cardiology

## 2018-02-01 ENCOUNTER — Encounter (HOSPITAL_COMMUNITY)
Admission: RE | Admit: 2018-02-01 | Discharge: 2018-02-01 | Disposition: A | Discharge status: Home / Self Care | Payer: Self-pay | Source: Ambulatory Visit | Attending: Interventional Cardiology | Admitting: Interventional Cardiology

## 2018-02-03 ENCOUNTER — Encounter (HOSPITAL_COMMUNITY)
Admission: RE | Admit: 2018-02-03 | Discharge: 2018-02-03 | Disposition: A | Discharge status: Home / Self Care | Payer: Medicare HMO | Source: Ambulatory Visit | Attending: Interventional Cardiology | Admitting: Interventional Cardiology

## 2018-02-06 ENCOUNTER — Encounter (HOSPITAL_COMMUNITY)
Admission: RE | Admit: 2018-02-06 | Discharge: 2018-02-06 | Disposition: A | Discharge status: Home / Self Care | Payer: Self-pay | Source: Ambulatory Visit | Attending: Interventional Cardiology | Admitting: Interventional Cardiology

## 2018-02-08 ENCOUNTER — Encounter (HOSPITAL_COMMUNITY)
Admission: RE | Admit: 2018-02-08 | Discharge: 2018-02-08 | Disposition: A | Discharge status: Home / Self Care | Payer: Self-pay | Source: Ambulatory Visit | Attending: Interventional Cardiology | Admitting: Interventional Cardiology

## 2018-02-10 ENCOUNTER — Encounter (HOSPITAL_COMMUNITY)
Admission: RE | Admit: 2018-02-10 | Discharge: 2018-02-10 | Disposition: A | Discharge status: Home / Self Care | Payer: Self-pay | Source: Ambulatory Visit | Attending: Interventional Cardiology | Admitting: Interventional Cardiology

## 2018-02-13 ENCOUNTER — Encounter (HOSPITAL_COMMUNITY)
Admission: RE | Admit: 2018-02-13 | Discharge: 2018-02-13 | Disposition: A | Discharge status: Home / Self Care | Payer: Self-pay | Source: Ambulatory Visit | Attending: Interventional Cardiology | Admitting: Interventional Cardiology

## 2018-02-17 ENCOUNTER — Encounter (HOSPITAL_COMMUNITY)
Admission: RE | Admit: 2018-02-17 | Discharge: 2018-02-17 | Disposition: A | Discharge status: Home / Self Care | Payer: Medicare HMO | Source: Ambulatory Visit | Attending: Interventional Cardiology | Admitting: Interventional Cardiology

## 2018-02-20 ENCOUNTER — Encounter (HOSPITAL_COMMUNITY)
Admission: RE | Admit: 2018-02-20 | Discharge: 2018-02-20 | Disposition: A | Discharge status: Home / Self Care | Payer: Self-pay | Source: Ambulatory Visit | Attending: Interventional Cardiology | Admitting: Interventional Cardiology

## 2018-02-24 ENCOUNTER — Encounter (HOSPITAL_COMMUNITY)
Admission: RE | Admit: 2018-02-24 | Discharge: 2018-02-24 | Disposition: A | Discharge status: Home / Self Care | Payer: Self-pay | Source: Ambulatory Visit | Attending: Interventional Cardiology | Admitting: Interventional Cardiology

## 2018-02-24 DIAGNOSIS — Z9889 Other specified postprocedural states: Secondary | ICD-10-CM | POA: Insufficient documentation

## 2018-02-27 ENCOUNTER — Encounter (HOSPITAL_COMMUNITY)
Admission: RE | Admit: 2018-02-27 | Discharge: 2018-02-27 | Disposition: A | Discharge status: Home / Self Care | Payer: Self-pay | Source: Ambulatory Visit | Attending: Interventional Cardiology | Admitting: Interventional Cardiology

## 2018-03-01 ENCOUNTER — Encounter (HOSPITAL_COMMUNITY)
Admission: RE | Admit: 2018-03-01 | Discharge: 2018-03-01 | Disposition: A | Discharge status: Home / Self Care | Payer: Self-pay | Source: Ambulatory Visit | Attending: Interventional Cardiology | Admitting: Interventional Cardiology

## 2018-03-03 ENCOUNTER — Encounter (HOSPITAL_COMMUNITY)
Admission: RE | Admit: 2018-03-03 | Discharge: 2018-03-03 | Disposition: A | Discharge status: Home / Self Care | Payer: Self-pay | Source: Ambulatory Visit | Attending: Interventional Cardiology | Admitting: Interventional Cardiology

## 2018-03-06 ENCOUNTER — Encounter (HOSPITAL_COMMUNITY)
Admission: RE | Admit: 2018-03-06 | Discharge: 2018-03-06 | Disposition: A | Discharge status: Home / Self Care | Payer: Self-pay | Source: Ambulatory Visit | Attending: Interventional Cardiology | Admitting: Interventional Cardiology

## 2018-03-08 ENCOUNTER — Encounter (HOSPITAL_COMMUNITY)
Admission: RE | Admit: 2018-03-08 | Discharge: 2018-03-08 | Disposition: A | Discharge status: Home / Self Care | Payer: Self-pay | Source: Ambulatory Visit | Attending: Interventional Cardiology | Admitting: Interventional Cardiology

## 2018-03-10 ENCOUNTER — Encounter (HOSPITAL_COMMUNITY)
Admission: RE | Admit: 2018-03-10 | Discharge: 2018-03-10 | Disposition: A | Discharge status: Home / Self Care | Payer: Self-pay | Source: Ambulatory Visit | Attending: Interventional Cardiology | Admitting: Interventional Cardiology

## 2018-03-13 ENCOUNTER — Encounter (HOSPITAL_COMMUNITY)
Admission: RE | Admit: 2018-03-13 | Discharge: 2018-03-13 | Disposition: A | Discharge status: Home / Self Care | Payer: Self-pay | Source: Ambulatory Visit | Attending: Interventional Cardiology | Admitting: Interventional Cardiology

## 2018-03-15 ENCOUNTER — Encounter (HOSPITAL_COMMUNITY)
Admission: RE | Admit: 2018-03-15 | Discharge: 2018-03-15 | Disposition: A | Discharge status: Home / Self Care | Payer: Self-pay | Source: Ambulatory Visit | Attending: Interventional Cardiology | Admitting: Interventional Cardiology

## 2018-03-17 ENCOUNTER — Encounter (HOSPITAL_COMMUNITY)
Admission: RE | Admit: 2018-03-17 | Discharge: 2018-03-17 | Disposition: A | Discharge status: Home / Self Care | Payer: Self-pay | Source: Ambulatory Visit | Attending: Interventional Cardiology | Admitting: Interventional Cardiology

## 2018-03-20 ENCOUNTER — Encounter (HOSPITAL_COMMUNITY)
Admission: RE | Admit: 2018-03-20 | Discharge: 2018-03-20 | Disposition: A | Discharge status: Home / Self Care | Payer: Self-pay | Source: Ambulatory Visit | Attending: Interventional Cardiology | Admitting: Interventional Cardiology

## 2018-03-22 ENCOUNTER — Encounter (HOSPITAL_COMMUNITY)
Admission: RE | Admit: 2018-03-22 | Discharge: 2018-03-22 | Disposition: A | Discharge status: Home / Self Care | Payer: Self-pay | Source: Ambulatory Visit | Attending: Interventional Cardiology | Admitting: Interventional Cardiology

## 2018-03-24 ENCOUNTER — Encounter (HOSPITAL_COMMUNITY)
Admission: RE | Admit: 2018-03-24 | Discharge: 2018-03-24 | Disposition: A | Discharge status: Home / Self Care | Payer: Self-pay | Source: Ambulatory Visit | Attending: Interventional Cardiology | Admitting: Interventional Cardiology

## 2018-03-27 ENCOUNTER — Encounter (HOSPITAL_COMMUNITY)
Admission: RE | Admit: 2018-03-27 | Discharge: 2018-03-27 | Disposition: A | Discharge status: Home / Self Care | Payer: Self-pay | Source: Ambulatory Visit | Attending: Interventional Cardiology | Admitting: Interventional Cardiology

## 2018-03-27 DIAGNOSIS — Z9889 Other specified postprocedural states: Secondary | ICD-10-CM | POA: Insufficient documentation

## 2018-03-29 ENCOUNTER — Encounter (HOSPITAL_COMMUNITY)
Admission: RE | Admit: 2018-03-29 | Discharge: 2018-03-29 | Disposition: A | Discharge status: Home / Self Care | Payer: Self-pay | Source: Ambulatory Visit | Attending: Interventional Cardiology | Admitting: Interventional Cardiology

## 2018-03-31 ENCOUNTER — Encounter (HOSPITAL_COMMUNITY)
Admission: RE | Admit: 2018-03-31 | Discharge: 2018-03-31 | Disposition: A | Discharge status: Home / Self Care | Payer: Self-pay | Source: Ambulatory Visit | Attending: Interventional Cardiology | Admitting: Interventional Cardiology

## 2018-04-03 ENCOUNTER — Encounter (HOSPITAL_COMMUNITY)
Admission: RE | Admit: 2018-04-03 | Discharge: 2018-04-03 | Disposition: A | Discharge status: Home / Self Care | Payer: Medicare HMO | Source: Ambulatory Visit | Attending: Interventional Cardiology | Admitting: Interventional Cardiology

## 2018-04-05 ENCOUNTER — Encounter (HOSPITAL_COMMUNITY)
Admission: RE | Admit: 2018-04-05 | Discharge: 2018-04-05 | Disposition: A | Discharge status: Home / Self Care | Payer: Self-pay | Source: Ambulatory Visit | Attending: Interventional Cardiology | Admitting: Interventional Cardiology

## 2018-04-05 NOTE — Progress Notes (Signed)
Cardiology Office Note:    Date:  04/06/2018   ID:  NITESH PITSTICK, DOB 04-19-1950, MRN [id number]  PCP:  Josetta Huddle, MD  Cardiologist:  Sinclair Grooms, MD   Referring MD: Josetta Huddle, MD   Chief Complaint  Patient presents with  . Coronary Artery Disease    History of Present Illness:    Jerry Tanner is a 68 y.o. male with a hx of mitral valve repair 2011, moderate coronary disease by pre-surgical catheterization, essential hypertension, and hyperlipidemia.  He is doing well.  He has not had angina.  He worries that he will have a heart attack.  He is still in the phase 3 cardiac rehab program he gets more than 150 minutes of activity per week.  He has noticed no change in endurance or symptoms to suggest ischemia.  He denies angina.  He denies palpitations.  He denies syncope.  Pulses are 2+ and symmetric in the upper and lower extremities.  There are no medication side effects.  He sleeps well at night according to his wife.   Past Medical History:  Diagnosis Date  . Atherosclerosis of native coronary artery of native heart without angina pectoris 02/01/2013  . H/O mitral valve repair 02/2009    Past Surgical History:  Procedure Laterality Date  . MITRAL VALVE REPAIR    . TONSILLECTOMY      Current Medications: Current Meds  Medication Sig  . amoxicillin (AMOXIL) 500 MG capsule Take 4 capsules by mouth as needed. Prior to dental procedures  . aspirin EC 81 MG tablet Take 81 mg by mouth daily.  . citalopram (CELEXA) 40 MG tablet Take 20 mg by mouth daily.   . simvastatin (ZOCOR) 20 MG tablet Take 20 mg by mouth daily.      Allergies:   Patient has no known allergies.   Social History   Socioeconomic History  . Marital status: Married    Spouse name: Not on file  . Number of children: Not on file  . Years of education: Not on file  . Highest education level: Not on file  Occupational History  . Not on file  Social Needs  . Financial resource strain:  Not on file  . Food insecurity:    Worry: Not on file    Inability: Not on file  . Transportation needs:    Medical: Not on file    Non-medical: Not on file  Tobacco Use  . Smoking status: Never Smoker  . Smokeless tobacco: Never Used  Substance and Sexual Activity  . Alcohol use: No    Alcohol/week: 0.0 standard drinks  . Drug use: No  . Sexual activity: Not on file  Lifestyle  . Physical activity:    Days per week: Not on file    Minutes per session: Not on file  . Stress: Not on file  Relationships  . Social connections:    Talks on phone: Not on file    Gets together: Not on file    Attends religious service: Not on file    Active member of club or organization: Not on file    Attends meetings of clubs or organizations: Not on file    Relationship status: Not on file  Other Topics Concern  . Not on file  Social History Narrative  . Not on file     Family History: The patient's family history includes Breast cancer in his mother; Heart Problems in his father and mother.  ROS:  Please see the history of present illness.    He has chronic back discomfort.  He snores.  All other systems reviewed and are negative.  EKGs/Labs/Other Studies Reviewed:    The following studies were reviewed today: 2D Doppler echocardiogram August 2019: Study Conclusions  - Left ventricle: The cavity size was normal. Systolic function was   normal. The estimated ejection fraction was in the range of 55%   to 60%. Wall motion was normal; there were no regional wall   motion abnormalities. Doppler parameters are consistent with   abnormal left ventricular relaxation (grade 1 diastolic   dysfunction). Doppler parameters are consistent with high   ventricular filling pressure. - Aortic valve: Transvalvular velocity was within the normal range.   There was no stenosis. There was no regurgitation. - Mitral valve: Prior procedures included surgical repair. The   findings are consistent  with mild stenosis. There was no   regurgitation. Pressure half-time: 113 ms. Mean gradient (D): 8   mm Hg. Valve area by continuity equation (using LVOT flow): 2.26   cm^2. - Right ventricle: The cavity size was normal. Wall thickness was   normal. Systolic function was normal. - Atrial septum: No defect or patent foramen ovale was identified   by color flow Doppler. - Tricuspid valve: There was trivial regurgitation. - Pulmonary arteries: Systolic pressure was within the normal   range. PA peak pressure: 18 mm Hg (S).  EKG:  EKG normal sinus rhythm, inferior Q waves unchanged from prior tracing.  Poor R wave progression with tall R wave in V1.  When compared to April 05, 2017, the heart rate is slightly faster.  Recent Labs: No results found for requested labs within last 8760 hours.  Recent Lipid Panel No results found for: CHOL, TRIG, HDL, CHOLHDL, VLDL, LDLCALC, LDLDIRECT  Physical Exam:    VS:  BP 118/72   Pulse 88   Ht 5\' 7"  (1.702 m)   Wt 171 lb 3.2 oz (77.7 kg)   SpO2 96%   BMI 26.81 kg/m     Wt Readings from Last 3 Encounters:  04/06/18 171 lb 3.2 oz (77.7 kg)  04/05/17 172 lb 6.4 oz (78.2 kg)  03/30/16 171 lb 3.2 oz (77.7 kg)     GEN: Healthy appearing. No acute distress HEENT: Normal NECK: No JVD. LYMPHATICS: No lymphadenopathy CARDIAC: RRR.  No murmur, no gallop, no edema VASCULAR: 2+ bilateral radial pulses, no bruits RESPIRATORY:  Clear to auscultation without rales, wheezing or rhonchi  ABDOMEN: Soft, non-tender, non-distended, No pulsatile mass, MUSCULOSKELETAL: No deformity  SKIN: Warm and dry NEUROLOGIC:  Alert and oriented x 3 PSYCHIATRIC:  Normal affect   ASSESSMENT:    1. Atherosclerosis of native coronary artery of native heart without angina pectoris   2. Hx of mitral valve repair   3. Essential hypertension, benign   4. Mixed hyperlipidemia    PLAN:    In order of problems listed above:  1. Stable from coronary standpoint.   Vigorous discussion concerning secondary prevention.  All secondary risk measures were rediscussed in detail. 2. No clinical evidence of mitral regurgitation now multiple years out from mitral valve repair. 3. Excellent blood pressure control.  Beta-blocker therapy has been discontinued. 4. LDL cholesterol was 72 when evaluated in December.  We discussed further decreasing saturated fat in his diet.  Overall education and awareness concerning primary/secondary risk prevention was discussed in detail: LDL less than 70, hemoglobin A1c less than 7, blood pressure target less than 130/80  mmHg, >150 minutes of moderate aerobic activity per week, avoidance of smoking, weight control (via diet and exercise), and continued surveillance/management of/for obstructive sleep apnea.  He snores, it clinically appears that he is getting good rest.  We will monitor clinically.   Medication Adjustments/Labs and Tests Ordered: Current medicines are reviewed at length with the patient today.  Concerns regarding medicines are outlined above.  No orders of the defined types were placed in this encounter.  No orders of the defined types were placed in this encounter.   There are no Patient Instructions on file for this visit.   Signed, Sinclair Grooms, MD  04/06/2018 9:32 AM    [place]

## 2018-04-06 ENCOUNTER — Encounter: Payer: Self-pay | Admitting: Interventional Cardiology

## 2018-04-06 ENCOUNTER — Ambulatory Visit: Payer: Medicare HMO | Admitting: Interventional Cardiology

## 2018-04-06 VITALS — BP 118/72 | HR 88 | Ht 67.0 in | Wt 171.2 lb

## 2018-04-06 DIAGNOSIS — E782 Mixed hyperlipidemia: Secondary | ICD-10-CM | POA: Diagnosis not present

## 2018-04-06 DIAGNOSIS — I10 Essential (primary) hypertension: Secondary | ICD-10-CM | POA: Diagnosis not present

## 2018-04-06 DIAGNOSIS — R69 Illness, unspecified: Secondary | ICD-10-CM | POA: Diagnosis not present

## 2018-04-06 DIAGNOSIS — I2510 Atherosclerotic heart disease of native coronary artery without angina pectoris: Secondary | ICD-10-CM | POA: Diagnosis not present

## 2018-04-06 DIAGNOSIS — Z9889 Other specified postprocedural states: Secondary | ICD-10-CM | POA: Diagnosis not present

## 2018-04-06 NOTE — Patient Instructions (Signed)

## 2018-04-07 ENCOUNTER — Encounter (HOSPITAL_COMMUNITY)
Admission: RE | Admit: 2018-04-07 | Discharge: 2018-04-07 | Disposition: A | Discharge status: Home / Self Care | Payer: Self-pay | Source: Ambulatory Visit | Attending: Interventional Cardiology | Admitting: Interventional Cardiology

## 2018-04-10 ENCOUNTER — Encounter (HOSPITAL_COMMUNITY)
Admission: RE | Admit: 2018-04-10 | Discharge: 2018-04-10 | Disposition: A | Discharge status: Home / Self Care | Payer: Self-pay | Source: Ambulatory Visit | Attending: Interventional Cardiology | Admitting: Interventional Cardiology

## 2018-04-12 ENCOUNTER — Encounter (HOSPITAL_COMMUNITY): Payer: Self-pay

## 2018-04-14 ENCOUNTER — Encounter (HOSPITAL_COMMUNITY)
Admission: RE | Admit: 2018-04-14 | Discharge: 2018-04-14 | Disposition: A | Discharge status: Home / Self Care | Payer: Self-pay | Source: Ambulatory Visit | Attending: Interventional Cardiology | Admitting: Interventional Cardiology

## 2018-04-17 ENCOUNTER — Encounter (HOSPITAL_COMMUNITY)
Admission: RE | Admit: 2018-04-17 | Discharge: 2018-04-17 | Disposition: A | Discharge status: Home / Self Care | Payer: Self-pay | Source: Ambulatory Visit | Attending: Interventional Cardiology | Admitting: Interventional Cardiology

## 2018-04-19 ENCOUNTER — Encounter (HOSPITAL_COMMUNITY)
Admission: RE | Admit: 2018-04-19 | Discharge: 2018-04-19 | Disposition: A | Discharge status: Home / Self Care | Payer: Self-pay | Source: Ambulatory Visit | Attending: Interventional Cardiology | Admitting: Interventional Cardiology

## 2018-04-21 ENCOUNTER — Encounter (HOSPITAL_COMMUNITY)
Admission: RE | Admit: 2018-04-21 | Discharge: 2018-04-21 | Disposition: A | Discharge status: Home / Self Care | Payer: Self-pay | Source: Ambulatory Visit | Attending: Interventional Cardiology | Admitting: Interventional Cardiology

## 2018-04-24 ENCOUNTER — Encounter (HOSPITAL_COMMUNITY)
Admission: RE | Admit: 2018-04-24 | Discharge: 2018-04-24 | Disposition: A | Discharge status: Home / Self Care | Payer: Medicare HMO | Source: Ambulatory Visit | Attending: Interventional Cardiology | Admitting: Interventional Cardiology

## 2018-04-24 DIAGNOSIS — Z9889 Other specified postprocedural states: Secondary | ICD-10-CM | POA: Diagnosis not present

## 2018-04-26 ENCOUNTER — Encounter (HOSPITAL_COMMUNITY)
Admission: RE | Admit: 2018-04-26 | Discharge: 2018-04-26 | Disposition: A | Discharge status: Home / Self Care | Payer: Medicare HMO | Source: Ambulatory Visit | Attending: Interventional Cardiology | Admitting: Interventional Cardiology

## 2018-04-28 ENCOUNTER — Encounter (HOSPITAL_COMMUNITY)
Admission: RE | Admit: 2018-04-28 | Discharge: 2018-04-28 | Disposition: A | Discharge status: Home / Self Care | Payer: Medicare HMO | Source: Ambulatory Visit | Attending: Interventional Cardiology | Admitting: Interventional Cardiology

## 2018-05-01 ENCOUNTER — Encounter (HOSPITAL_COMMUNITY)
Admission: RE | Admit: 2018-05-01 | Discharge: 2018-05-01 | Disposition: A | Discharge status: Home / Self Care | Payer: Medicare HMO | Source: Ambulatory Visit | Attending: Interventional Cardiology | Admitting: Interventional Cardiology

## 2018-05-01 DIAGNOSIS — Z9889 Other specified postprocedural states: Secondary | ICD-10-CM | POA: Diagnosis not present

## 2018-05-03 ENCOUNTER — Other Ambulatory Visit: Payer: Self-pay

## 2018-05-03 ENCOUNTER — Encounter (HOSPITAL_COMMUNITY)
Admission: RE | Admit: 2018-05-03 | Discharge: 2018-05-03 | Disposition: A | Discharge status: Home / Self Care | Payer: Medicare HMO | Source: Ambulatory Visit | Attending: Interventional Cardiology | Admitting: Interventional Cardiology

## 2018-05-05 ENCOUNTER — Encounter (HOSPITAL_COMMUNITY)
Admission: RE | Admit: 2018-05-05 | Discharge: 2018-05-05 | Disposition: A | Discharge status: Home / Self Care | Payer: Medicare HMO | Source: Ambulatory Visit | Attending: Interventional Cardiology | Admitting: Interventional Cardiology

## 2018-05-05 ENCOUNTER — Other Ambulatory Visit: Payer: Self-pay

## 2018-05-08 ENCOUNTER — Telehealth (HOSPITAL_COMMUNITY): Payer: Self-pay | Admitting: *Deleted

## 2018-05-08 ENCOUNTER — Encounter (HOSPITAL_COMMUNITY): Payer: Medicare HMO

## 2018-05-08 NOTE — Telephone Encounter (Signed)
Contacted patient to notify of Cardiac Rehab department closure x2 weeks. Pt verbalized understanding. Kerigan Narvaez, Exercise Physiologist Cardiac and Pulmonary Rehabilitation  

## 2018-05-10 ENCOUNTER — Encounter (HOSPITAL_COMMUNITY): Payer: Medicare HMO

## 2018-05-12 ENCOUNTER — Encounter (HOSPITAL_COMMUNITY): Payer: Medicare HMO

## 2018-05-15 ENCOUNTER — Encounter (HOSPITAL_COMMUNITY): Payer: Medicare HMO

## 2018-05-16 ENCOUNTER — Telehealth (HOSPITAL_COMMUNITY): Payer: Self-pay | Admitting: *Deleted

## 2018-05-16 NOTE — Telephone Encounter (Signed)
Called to notify patient that the cardiac and pulmonary rehabilitation department will be closed for 4 weeks due to COVID-19 restrictions. Pt verbalized understanding. Patient is walking daily and using hand weights at home as his mode of exercise at this time. Sol Passer, MS, ACSM CEP

## 2018-05-17 ENCOUNTER — Encounter (HOSPITAL_COMMUNITY): Payer: Medicare HMO

## 2018-05-19 ENCOUNTER — Encounter (HOSPITAL_COMMUNITY): Payer: Medicare HMO

## 2018-05-22 ENCOUNTER — Encounter (HOSPITAL_COMMUNITY): Payer: Medicare HMO

## 2018-05-24 ENCOUNTER — Encounter (HOSPITAL_COMMUNITY): Payer: Medicare HMO

## 2018-05-26 ENCOUNTER — Encounter (HOSPITAL_COMMUNITY): Payer: Medicare HMO

## 2018-05-29 ENCOUNTER — Encounter (HOSPITAL_COMMUNITY): Payer: Medicare HMO

## 2018-05-31 ENCOUNTER — Encounter (HOSPITAL_COMMUNITY): Payer: Medicare HMO

## 2018-06-02 ENCOUNTER — Encounter (HOSPITAL_COMMUNITY): Payer: Medicare HMO

## 2018-06-02 ENCOUNTER — Telehealth (HOSPITAL_COMMUNITY): Payer: Self-pay | Admitting: *Deleted

## 2018-06-02 NOTE — Telephone Encounter (Signed)
Called to notify patient that the cardiac and pulmonary rehabilitation department remains closed at this time due to COVID-19 restrictions. Contacted telephone number provided, unable to leave message.  Sol Passer, MS, ACSM CEP 06/02/2018 1249

## 2018-06-05 ENCOUNTER — Encounter (HOSPITAL_COMMUNITY): Payer: Medicare HMO

## 2018-06-07 ENCOUNTER — Encounter (HOSPITAL_COMMUNITY): Payer: Medicare HMO

## 2018-06-09 ENCOUNTER — Encounter (HOSPITAL_COMMUNITY): Payer: Medicare HMO

## 2018-06-12 ENCOUNTER — Encounter (HOSPITAL_COMMUNITY): Payer: Medicare HMO

## 2018-06-14 ENCOUNTER — Encounter (HOSPITAL_COMMUNITY): Payer: Medicare HMO

## 2018-06-16 ENCOUNTER — Encounter (HOSPITAL_COMMUNITY): Payer: Medicare HMO

## 2018-06-19 ENCOUNTER — Encounter (HOSPITAL_COMMUNITY): Payer: Medicare HMO

## 2018-06-21 ENCOUNTER — Encounter (HOSPITAL_COMMUNITY): Payer: Medicare HMO

## 2018-06-23 ENCOUNTER — Encounter (HOSPITAL_COMMUNITY): Payer: Medicare HMO

## 2018-06-26 ENCOUNTER — Encounter (HOSPITAL_COMMUNITY): Payer: Medicare HMO

## 2018-06-28 ENCOUNTER — Encounter (HOSPITAL_COMMUNITY): Payer: Medicare HMO

## 2018-06-30 ENCOUNTER — Encounter (HOSPITAL_COMMUNITY): Payer: Medicare HMO

## 2018-07-03 ENCOUNTER — Encounter (HOSPITAL_COMMUNITY): Payer: Medicare HMO

## 2018-07-05 ENCOUNTER — Encounter (HOSPITAL_COMMUNITY): Payer: Medicare HMO

## 2018-07-07 ENCOUNTER — Encounter (HOSPITAL_COMMUNITY): Payer: Medicare HMO

## 2018-07-10 ENCOUNTER — Encounter (HOSPITAL_COMMUNITY): Payer: Medicare HMO

## 2018-07-12 ENCOUNTER — Encounter (HOSPITAL_COMMUNITY): Payer: Medicare HMO

## 2018-07-14 ENCOUNTER — Encounter (HOSPITAL_COMMUNITY): Payer: Medicare HMO

## 2018-07-19 ENCOUNTER — Encounter (HOSPITAL_COMMUNITY): Payer: Medicare HMO

## 2018-07-21 ENCOUNTER — Encounter (HOSPITAL_COMMUNITY): Payer: Medicare HMO

## 2018-07-24 ENCOUNTER — Encounter (HOSPITAL_COMMUNITY): Payer: Medicare HMO

## 2018-07-26 ENCOUNTER — Encounter (HOSPITAL_COMMUNITY): Payer: Medicare HMO

## 2018-07-27 DIAGNOSIS — I4891 Unspecified atrial fibrillation: Secondary | ICD-10-CM | POA: Diagnosis not present

## 2018-07-27 DIAGNOSIS — R69 Illness, unspecified: Secondary | ICD-10-CM | POA: Diagnosis not present

## 2018-07-27 DIAGNOSIS — I2510 Atherosclerotic heart disease of native coronary artery without angina pectoris: Secondary | ICD-10-CM | POA: Diagnosis not present

## 2018-07-27 DIAGNOSIS — F329 Major depressive disorder, single episode, unspecified: Secondary | ICD-10-CM | POA: Diagnosis not present

## 2018-07-28 ENCOUNTER — Encounter (HOSPITAL_COMMUNITY): Payer: Medicare HMO

## 2018-07-31 ENCOUNTER — Encounter (HOSPITAL_COMMUNITY): Payer: Medicare HMO

## 2018-08-02 ENCOUNTER — Encounter (HOSPITAL_COMMUNITY): Payer: Medicare HMO

## 2018-08-04 ENCOUNTER — Encounter (HOSPITAL_COMMUNITY): Payer: Medicare HMO

## 2018-08-07 ENCOUNTER — Encounter (HOSPITAL_COMMUNITY): Payer: Medicare HMO

## 2018-08-08 DIAGNOSIS — R69 Illness, unspecified: Secondary | ICD-10-CM | POA: Diagnosis not present

## 2018-08-09 ENCOUNTER — Encounter (HOSPITAL_COMMUNITY): Payer: Medicare HMO

## 2018-08-11 ENCOUNTER — Encounter (HOSPITAL_COMMUNITY): Payer: Medicare HMO

## 2018-08-14 ENCOUNTER — Encounter (HOSPITAL_COMMUNITY): Payer: Medicare HMO

## 2018-08-16 ENCOUNTER — Encounter (HOSPITAL_COMMUNITY): Payer: Medicare HMO

## 2018-08-18 ENCOUNTER — Encounter (HOSPITAL_COMMUNITY): Payer: Medicare HMO

## 2018-08-21 ENCOUNTER — Encounter (HOSPITAL_COMMUNITY): Payer: Medicare HMO

## 2018-08-23 ENCOUNTER — Encounter (HOSPITAL_COMMUNITY): Payer: Medicare HMO

## 2018-08-23 ENCOUNTER — Telehealth (HOSPITAL_COMMUNITY): Payer: Self-pay | Admitting: *Deleted

## 2018-08-23 NOTE — Telephone Encounter (Signed)
Called to update patient on the status of maintenance Cardiac Rehab exercise classes.  At this point they remain on hold due to COVID-19 pandemic precautions. 

## 2018-08-28 ENCOUNTER — Encounter (HOSPITAL_COMMUNITY): Payer: Medicare HMO

## 2018-08-30 ENCOUNTER — Encounter (HOSPITAL_COMMUNITY): Payer: Medicare HMO

## 2018-09-01 ENCOUNTER — Encounter (HOSPITAL_COMMUNITY): Payer: Medicare HMO

## 2018-09-04 ENCOUNTER — Encounter (HOSPITAL_COMMUNITY): Payer: Medicare HMO

## 2018-09-06 ENCOUNTER — Encounter (HOSPITAL_COMMUNITY): Payer: Medicare HMO

## 2018-09-08 ENCOUNTER — Encounter (HOSPITAL_COMMUNITY): Payer: Medicare HMO

## 2018-09-11 ENCOUNTER — Encounter (HOSPITAL_COMMUNITY): Payer: Medicare HMO

## 2018-09-13 ENCOUNTER — Encounter (HOSPITAL_COMMUNITY): Payer: Medicare HMO

## 2018-09-15 ENCOUNTER — Encounter (HOSPITAL_COMMUNITY): Payer: Medicare HMO

## 2018-09-18 ENCOUNTER — Encounter (HOSPITAL_COMMUNITY): Payer: Medicare HMO

## 2018-09-20 ENCOUNTER — Encounter (HOSPITAL_COMMUNITY): Payer: Medicare HMO

## 2018-09-22 ENCOUNTER — Encounter (HOSPITAL_COMMUNITY): Payer: Medicare HMO

## 2018-10-31 ENCOUNTER — Telehealth (HOSPITAL_COMMUNITY): Payer: Self-pay | Admitting: Internal Medicine

## 2018-11-13 DIAGNOSIS — Z23 Encounter for immunization: Secondary | ICD-10-CM | POA: Diagnosis not present

## 2018-12-14 DIAGNOSIS — R69 Illness, unspecified: Secondary | ICD-10-CM | POA: Diagnosis not present

## 2018-12-29 DIAGNOSIS — I2510 Atherosclerotic heart disease of native coronary artery without angina pectoris: Secondary | ICD-10-CM | POA: Diagnosis not present

## 2018-12-29 DIAGNOSIS — I4891 Unspecified atrial fibrillation: Secondary | ICD-10-CM | POA: Diagnosis not present

## 2018-12-29 DIAGNOSIS — F329 Major depressive disorder, single episode, unspecified: Secondary | ICD-10-CM | POA: Diagnosis not present

## 2018-12-29 DIAGNOSIS — E7800 Pure hypercholesterolemia, unspecified: Secondary | ICD-10-CM | POA: Diagnosis not present

## 2018-12-29 DIAGNOSIS — R69 Illness, unspecified: Secondary | ICD-10-CM | POA: Diagnosis not present

## 2019-01-30 DIAGNOSIS — I2510 Atherosclerotic heart disease of native coronary artery without angina pectoris: Secondary | ICD-10-CM | POA: Diagnosis not present

## 2019-01-30 DIAGNOSIS — N529 Male erectile dysfunction, unspecified: Secondary | ICD-10-CM | POA: Diagnosis not present

## 2019-01-30 DIAGNOSIS — R69 Illness, unspecified: Secondary | ICD-10-CM | POA: Diagnosis not present

## 2019-01-30 DIAGNOSIS — Z0000 Encounter for general adult medical examination without abnormal findings: Secondary | ICD-10-CM | POA: Diagnosis not present

## 2019-01-30 DIAGNOSIS — Z1389 Encounter for screening for other disorder: Secondary | ICD-10-CM | POA: Diagnosis not present

## 2019-01-30 DIAGNOSIS — Z125 Encounter for screening for malignant neoplasm of prostate: Secondary | ICD-10-CM | POA: Diagnosis not present

## 2019-01-30 DIAGNOSIS — I4891 Unspecified atrial fibrillation: Secondary | ICD-10-CM | POA: Diagnosis not present

## 2019-01-30 DIAGNOSIS — E7800 Pure hypercholesterolemia, unspecified: Secondary | ICD-10-CM | POA: Diagnosis not present

## 2019-02-13 ENCOUNTER — Telehealth (HOSPITAL_COMMUNITY): Payer: Self-pay | Admitting: *Deleted

## 2019-02-13 DIAGNOSIS — Z9889 Other specified postprocedural states: Secondary | ICD-10-CM

## 2019-02-13 NOTE — Telephone Encounter (Signed)
Contacted patient to update status of return to exercise in the cardiac rehab maintenance program. Patient aware that we will be temporarily suspending onsite maintenance exercise classes and would like to be contacted once we resume. Encouraged patient to walk in the meantime, and patient is amenable to this. Patient is also interested in the P.R.E.P. program at the [facility], will send referral.  Sol Passer, MS, ACSM CEP 02/13/2019 1607

## 2019-02-13 NOTE — Telephone Encounter (Signed)
Contacted patient to update status of return to exercise in the cardiac rehab maintenance program. Left message on patient's voicemail.  Sol Passer, MS, ACSM CEP

## 2019-02-13 NOTE — Addendum Note (Signed)
Addended by: Sol Passer on: 02/13/2019 04:24 PM   Modules accepted: Orders

## 2019-02-20 DIAGNOSIS — I2510 Atherosclerotic heart disease of native coronary artery without angina pectoris: Secondary | ICD-10-CM | POA: Diagnosis not present

## 2019-02-20 DIAGNOSIS — F329 Major depressive disorder, single episode, unspecified: Secondary | ICD-10-CM | POA: Diagnosis not present

## 2019-02-20 DIAGNOSIS — E7800 Pure hypercholesterolemia, unspecified: Secondary | ICD-10-CM | POA: Diagnosis not present

## 2019-02-20 DIAGNOSIS — R69 Illness, unspecified: Secondary | ICD-10-CM | POA: Diagnosis not present

## 2019-02-20 DIAGNOSIS — I4891 Unspecified atrial fibrillation: Secondary | ICD-10-CM | POA: Diagnosis not present

## 2019-02-22 ENCOUNTER — Telehealth: Payer: Self-pay

## 2019-02-22 NOTE — Telephone Encounter (Signed)
Call placed reference PREP referral.  Patient is agreeable to program. Next Start date is 03/12/2019 Intake scheduled for 03/06/2019 at [facility]

## 2019-03-01 ENCOUNTER — Other Ambulatory Visit: Payer: Medicare HMO

## 2019-03-01 DIAGNOSIS — Z20822 Contact with and (suspected) exposure to covid-19: Secondary | ICD-10-CM | POA: Diagnosis not present

## 2019-03-21 ENCOUNTER — Ambulatory Visit: Payer: Medicare HMO

## 2019-03-21 DIAGNOSIS — F329 Major depressive disorder, single episode, unspecified: Secondary | ICD-10-CM | POA: Diagnosis not present

## 2019-03-21 DIAGNOSIS — E7800 Pure hypercholesterolemia, unspecified: Secondary | ICD-10-CM | POA: Diagnosis not present

## 2019-03-21 DIAGNOSIS — I4891 Unspecified atrial fibrillation: Secondary | ICD-10-CM | POA: Diagnosis not present

## 2019-03-21 DIAGNOSIS — R69 Illness, unspecified: Secondary | ICD-10-CM | POA: Diagnosis not present

## 2019-03-21 DIAGNOSIS — I2510 Atherosclerotic heart disease of native coronary artery without angina pectoris: Secondary | ICD-10-CM | POA: Diagnosis not present

## 2019-03-27 ENCOUNTER — Telehealth: Payer: Self-pay

## 2019-03-27 NOTE — Telephone Encounter (Signed)
Call to update patient on date and time of next PREP 04/17/2019 start T/Th 1-215p x 12 wks. He is agreeable  Intake appt 2/6 at 1130am at [facility]

## 2019-03-29 ENCOUNTER — Ambulatory Visit: Payer: Medicare HMO | Attending: Internal Medicine

## 2019-03-29 DIAGNOSIS — Z23 Encounter for immunization: Secondary | ICD-10-CM

## 2019-03-29 NOTE — Progress Notes (Signed)
   Covid-19 Vaccination Clinic  Name:  Jerry Tanner    MRN: [id number] DOB: 1950-06-23  03/29/2019  Jerry Tanner was observed post Covid-19 immunization for 15 minutes without incidence. He was provided with Vaccine Information Sheet and instruction to access the V-Safe system.   Jerry Tanner was instructed to call 911 with any severe reactions post vaccine: Marland Kitchen Difficulty breathing  . Swelling of your face and throat  . A fast heartbeat  . A bad rash all over your body  . Dizziness and weakness    Immunizations Administered    Name Date Dose VIS Date Route   Pfizer COVID-19 Vaccine 03/29/2019 10:29 AM 0.3 mL 02/02/2019 Intramuscular   Manufacturer: [place]   Lot: [id number]   [place]: [id number]

## 2019-04-05 DIAGNOSIS — F329 Major depressive disorder, single episode, unspecified: Secondary | ICD-10-CM | POA: Diagnosis not present

## 2019-04-05 DIAGNOSIS — I2510 Atherosclerotic heart disease of native coronary artery without angina pectoris: Secondary | ICD-10-CM | POA: Diagnosis not present

## 2019-04-05 DIAGNOSIS — R69 Illness, unspecified: Secondary | ICD-10-CM | POA: Diagnosis not present

## 2019-04-05 DIAGNOSIS — E7800 Pure hypercholesterolemia, unspecified: Secondary | ICD-10-CM | POA: Diagnosis not present

## 2019-04-05 DIAGNOSIS — I4891 Unspecified atrial fibrillation: Secondary | ICD-10-CM | POA: Diagnosis not present

## 2019-04-10 NOTE — Progress Notes (Signed)
[place] Report   Patient Details  Name: Jerry Tanner MRN: [id number] Date of Birth: [birth year] Age: 69 y.o. PCP: Jerry Huddle, MD  Vitals:   04/10/19 1221  BP: 120/82  Pulse: 78  Resp: 16  SpO2: 95%  Weight: 149 lb 12.8 oz (67.9 kg)  Height: 5\' 8"  (1.727 m)     [name] Eval - 04/10/19 1200      Referral    Referring Provider  cardiac rehab   Cardiac Rehab was in maint x 9 years   Reason for referral  Inactivity    Program Start Date  04/17/19      Measurement   Neck measurement  16 Inches    Waist Circumference  34 inches   with thick shirt on   Body fat  19.5 percent   underweight for height, reports wt loss of 20 lbs recently     Information for Trainer   Goals  return to exercise 3x wk, flexbility, relief of back pain    Current Exercise  none    Orthopedic Concerns  back pain     Pertinent Medical History  MVR, depression, anxiety, CAD     Current Barriers  none    Restrictions/Precautions  --   none noted    Medications that affect exercise  Medication causing dizziness/drowsiness    minimal meds, on anti anxiety meds.      Timed Up and Go (TUGS)   Timed Up and Go  Low risk <9 seconds      Mobility and Daily Activities   I find it easy to walk up or down two or more flights of stairs.  2    I have no trouble taking out the trash.  2    I do housework such as vacuuming and dusting on my own without difficulty.  2    I can easily lift a gallon of milk (8lbs).  4    I can easily walk a mile.  2    I have no trouble reaching into high cupboards or reaching down to pick up something from the floor.  1    I do not have trouble doing out-door work such as Armed forces logistics/support/administrative officer, raking leaves, or gardening.  2      Mobility and Daily Activities   I feel younger than my age.  1    I feel independent.  2    I feel energetic.  1    I live an active life.   2    I feel strong.  1    I feel healthy.  2    I feel active as other people my age.  2       How fit and strong are you.   Fit and Strong Total Score  26      Past Medical History:  Diagnosis Date  . Atherosclerosis of native coronary artery of native heart without angina pectoris 02/01/2013  . H/O mitral valve repair 02/2009   Past Surgical History:  Procedure Laterality Date  . MITRAL VALVE REPAIR    . TONSILLECTOMY     Social History   Tobacco Use  Smoking Status Never Smoker  Smokeless Tobacco Never Used     Intake completed for PREP start date 04/17/2019 every Tues/Thur at 1pm-215pm x 12 wks Has noteable stressors with wife moving into a memory care unit next week. Encouraged to seek out someone to talk to via his MD office.  Jerry Tanner 04/10/2019, 12:27 PM

## 2019-04-11 ENCOUNTER — Ambulatory Visit: Payer: Medicare HMO

## 2019-04-17 NOTE — Progress Notes (Signed)
[facility] PREP Weekly Session   Patient Details  Name: Jerry Tanner MRN: [id number] Date of Birth: [birth year] Age: 69 y.o. PCP: Josetta Huddle, MD  There were no vitals filed for this visit.  [name] Weekly seesion - 04/17/19 1500      Weekly Session   Topic Discussed  Goal setting and welcome to the program   Scale of perceived exertion reviewed    Comments  12 wk strength training sched given       Toured facility Reviewed goals Reviewed overall program and plans for thursday  Barnett Hatter 04/17/2019, 3:26 PM

## 2019-04-23 ENCOUNTER — Ambulatory Visit: Payer: Medicare HMO | Attending: Internal Medicine

## 2019-04-23 DIAGNOSIS — Z23 Encounter for immunization: Secondary | ICD-10-CM

## 2019-04-23 NOTE — Progress Notes (Signed)
   Covid-19 Vaccination Clinic  Name:  Jerry Tanner    MRN: [id number] DOB: 03/26/1950  04/23/2019  [name] was observed post Covid-19 immunization for 15 minutes without incidence. He was provided with Vaccine Information Sheet and instruction to access the V-Safe system.   [name] was instructed to call 911 with any severe reactions post vaccine: Marland Kitchen Difficulty breathing  . Swelling of your face and throat  . A fast heartbeat  . A bad rash all over your body  . Dizziness and weakness    Immunizations Administered    Name Date Dose VIS Date Route   Pfizer COVID-19 Vaccine 04/23/2019  2:19 PM 0.3 mL 02/02/2019 Intramuscular   Manufacturer: [place]   Lot: [id number]   [place]: [id number]

## 2019-04-24 NOTE — Progress Notes (Signed)
[facility] PREP Weekly Session   Patient Details  Name: Jerry Tanner MRN: [id number] Date of Birth: 1950-06-25 Age: 69 y.o. PCP: Josetta Huddle, MD  Vitals:   04/24/19 1633  Weight: 145 lb (65.8 kg)    [name] Weekly seesion - 04/24/19 1600      Weekly Session   Topic Discussed  Importance of resistance training;Other ways to be active    Minutes exercised this week  82 minutes   40 min cardio, 12 min strength, 20 min of flexibility   Classes attended to date  2    Comments  did not attend session today given materials to go      Did not attend session today d/t not feeling well after vaccine. Encouraged rest, fluids.  Did hand in weekly progress report: Fun things: had dinner with my step daughter Grateful for: walking 2.5 miles, having 2nd covid shot Nutrition celebration: cut back on sweets Struggles: back pain  Barnett Hatter 04/24/2019, 4:34 PM

## 2019-05-01 NOTE — Progress Notes (Signed)
[facility] PREP Weekly Session   Patient Details  Name: ASTEN PELICAN MRN: [id number] Date of Birth: 1950-07-26 Age: 69 y.o. PCP: Josetta Huddle, MD  Vitals:   05/01/19 1814  Weight: 150 lb (68 kg)    [name] Weekly seesion - 05/01/19 1800      Weekly Session   Topic Discussed  Healthy eating tips   hidden sugar brochure given   Minutes exercised this week  160 minutes   160 min of cardio, stretch every day   Classes attended to date  4      Fun things since last meeting: watched [facility] with family Grateful for: family Nutrition celebration: cut back on some on sweets Drinking more water Barriers/struggles: still struggle with eating sweets, my cooking   Barnett Hatter 05/01/2019, 6:16 PM

## 2019-05-07 DIAGNOSIS — I2510 Atherosclerotic heart disease of native coronary artery without angina pectoris: Secondary | ICD-10-CM | POA: Diagnosis not present

## 2019-05-07 DIAGNOSIS — I4891 Unspecified atrial fibrillation: Secondary | ICD-10-CM | POA: Diagnosis not present

## 2019-05-07 DIAGNOSIS — R69 Illness, unspecified: Secondary | ICD-10-CM | POA: Diagnosis not present

## 2019-05-07 DIAGNOSIS — E7800 Pure hypercholesterolemia, unspecified: Secondary | ICD-10-CM | POA: Diagnosis not present

## 2019-05-08 NOTE — Progress Notes (Signed)
[facility] PREP Weekly Session   Patient Details  Name: Jerry Tanner MRN: [id number] Date of Birth: 1950/05/15 Age: 69 y.o. PCP: Josetta Huddle, MD  Vitals:   05/08/19 1815  Weight: 155 lb (70.3 kg)    [name] Weekly seesion - 05/08/19 1800      Weekly Session   Topic Discussed  Health habits    Minutes exercised this week  140 minutes   140 cardio-2 mile walk over weekend   Classes attended to date  5      Fun things since last meeting: saw wife at [employer] for: family and friends who check on me Nutrition celebration: still cutting back on sweets Barriers/struggles: missing wife at home. Need to do more strength training at home   Barnett Hatter 05/08/2019, 6:16 PM

## 2019-05-15 NOTE — Progress Notes (Signed)
[facility] PREP Weekly Session   Patient Details  Name: Jerry Tanner MRN: [id number] Date of Birth: October 10, 1950 Age: 69 y.o. PCP: Josetta Huddle, MD  Vitals:   05/15/19 1616  Weight: 150 lb (68 kg)    [name] Weekly seesion - 05/15/19 1600      Weekly Session   Topic Discussed  Restaurant Eating   salt demo   Minutes exercised this week  130 minutes   stretching every day   Classes attended to date  31      Fun things: lunch outside with friends Grateful for: wife getting used to new home Nutrition Celebration: cut back on sweets Barriers/Struggles: only walking at home, no strength training.   Barnett Hatter 05/15/2019, 4:17 PM

## 2019-05-23 NOTE — Progress Notes (Signed)
[facility] PREP Weekly Session   Patient Details  Name: Jerry Tanner MRN: [id number] Date of Birth: 03-30-1950 Age: 69 y.o. PCP: Josetta Huddle, MD  Vitals:   05/23/19 0931  Weight: 155 lb (70.3 kg)    [name] Weekly seesion - 05/23/19 0900      Weekly Session   Topic Discussed  Stress management and problem solving   chair yoga and breathwork   Minutes exercised this week  130 minutes   with back stretches    Classes attended to date  10     fun things since last meeting: worked out at [employer] on Sat Grateful for: still being somewhat healthy Nutrition Celebration: cut back on cooking Barriers: need to exercise more between tues and thurs   [employer] 05/23/2019, 9:32 AM

## 2019-05-29 NOTE — Progress Notes (Signed)
[facility] PREP Weekly Session   Patient Details  Name: Jerry Tanner MRN: [id number] Date of Birth: 02-04-1951 Age: 69 y.o. PCP: Josetta Huddle, MD  Vitals:   05/29/19 1611  Weight: 155 lb (70.3 kg)    [name] Weekly seesion - 05/29/19 1600      Weekly Session   Topic Discussed  Expectations and non-scale victories   sugar demo   Minutes exercised this week  145 minutes    Classes attended to date  8      Fun things you did since last meeting: watched basketball games Sat at [facility] for: Heart Nutrition celebrations: Trying to be healthy Barriers/struggles: too many sweets   Barnett Hatter 05/29/2019, 4:12 PM

## 2019-06-05 NOTE — Progress Notes (Signed)
[facility] PREP Weekly Session   Patient Details  Name: Jerry Tanner MRN: [id number] Date of Birth: 11-Oct-1950 Age: 69 y.o. PCP: Josetta Huddle, MD  Vitals:   06/05/19 1620  Weight: 155 lb (70.3 kg)    [name] Weekly seesion - 06/05/19 1600      Weekly Session   Topic Discussed  --   Portion Control   Minutes exercised this week  90 minutes    Classes attended to date  70      Fun things since last meeting: Watched Netflix with Mom Grateful for: Mom and wife Nutrition Celebration: tossed some sweets in trash Barriers/struggles: not enough strength exercise  Barnett Hatter 06/05/2019, 4:21 PM

## 2019-06-07 DIAGNOSIS — R69 Illness, unspecified: Secondary | ICD-10-CM | POA: Diagnosis not present

## 2019-06-08 DIAGNOSIS — E7800 Pure hypercholesterolemia, unspecified: Secondary | ICD-10-CM | POA: Diagnosis not present

## 2019-06-08 DIAGNOSIS — R69 Illness, unspecified: Secondary | ICD-10-CM | POA: Diagnosis not present

## 2019-06-08 DIAGNOSIS — F329 Major depressive disorder, single episode, unspecified: Secondary | ICD-10-CM | POA: Diagnosis not present

## 2019-06-08 DIAGNOSIS — I4891 Unspecified atrial fibrillation: Secondary | ICD-10-CM | POA: Diagnosis not present

## 2019-06-08 DIAGNOSIS — I2510 Atherosclerotic heart disease of native coronary artery without angina pectoris: Secondary | ICD-10-CM | POA: Diagnosis not present

## 2019-06-12 NOTE — Progress Notes (Signed)
[facility] PREP Weekly Session   Patient Details  Name: Jerry Tanner MRN: [id number] Date of Birth: 04-28-1950 Age: 69 y.o. PCP: Josetta Huddle, MD  Vitals:   06/12/19 1616  Weight: 159 lb 6.4 oz (72.3 kg)    [name] Weekly seesion - 06/12/19 1600      Weekly Session   Topic Discussed  Finding support    Minutes exercised this week  110 minutes    Classes attended to date  79      Fun things since last meeting: watched grandson play soccer Grateful for: health and family Nutrition celebration: ate more fish Barriers/struggles: still eating too many sweets   Pam Tally Joe 06/12/2019, 4:17 PM

## 2019-06-20 NOTE — Progress Notes (Signed)
Cardiology Office Note:    Date:  06/21/2019   ID:  Jerry Tanner, DOB 26-Jul-1950, MRN [id number]  PCP:  Josetta Huddle, MD  Cardiologist:  Sinclair Grooms, MD   Referring MD: Josetta Huddle, MD   Chief Complaint  Patient presents with  . Cardiac Valve Problem    Mitral valve repair  . Coronary Artery Disease    History of Present Illness:    Jerry Tanner is a 69 y.o. male with a hx of mitral valve repair 2011, moderate coronary disease by pre-surgical catheterization, essential hypertension, and hyperlipidemia.  States he had depression in the middle of the COVID-19 pandemic.  His wife developed COVID-19 infection.  She is now in a facility for those with Alzheimer's.  He denies cardiac symptoms of shortness of breath, orthopnea, PND, racing heart, syncope, edema, and other complaints.  Past Medical History:  Diagnosis Date  . Atherosclerosis of native coronary artery of native heart without angina pectoris 02/01/2013  . H/O mitral valve repair 02/2009    Past Surgical History:  Procedure Laterality Date  . MITRAL VALVE REPAIR    . TONSILLECTOMY      Current Medications: Current Meds  Medication Sig  . amoxicillin (AMOXIL) 500 MG capsule Take 4 capsules by mouth as needed. Prior to dental procedures  . aspirin EC 81 MG tablet Take 81 mg by mouth daily.  Marland Kitchen buPROPion (ZYBAN) 150 MG 12 hr tablet Take 150 mg by mouth daily.  . citalopram (CELEXA) 40 MG tablet Take 40 mg by mouth daily.   Marland Kitchen LORazepam (ATIVAN) 0.5 MG tablet Take 0.5 mg by mouth every 8 (eight) hours as needed for anxiety.  . sertraline (ZOLOFT) 50 MG tablet Take 50 mg by mouth daily.  . simvastatin (ZOCOR) 20 MG tablet Take 20 mg by mouth daily.      Allergies:   Patient has no known allergies.   Social History   Socioeconomic History  . Marital status: Married    Spouse name: Not on file  . Number of children: Not on file  . Years of education: Not on file  . Highest education level: Not on file    Occupational History  . Not on file  Tobacco Use  . Smoking status: Never Smoker  . Smokeless tobacco: Never Used  Substance and Sexual Activity  . Alcohol use: No    Alcohol/week: 0.0 standard drinks  . Drug use: No  . Sexual activity: Not on file  Other Topics Concern  . Not on file  Social History Narrative  . Not on file   Social Determinants of Health   Financial Resource Strain:   . Difficulty of Paying Living Expenses:   Food Insecurity:   . Worried About Charity fundraiser in the Last Year:   . Arboriculturist in the Last Year:   Transportation Needs:   . Film/video editor (Medical):   Marland Kitchen Lack of Transportation (Non-Medical):   Physical Activity:   . Days of Exercise per Week:   . Minutes of Exercise per Session:   Stress:   . Feeling of Stress :   Social Connections:   . Frequency of Communication with Friends and Family:   . Frequency of Social Gatherings with Friends and Family:   . Attends Religious Services:   . Active Member of Clubs or Organizations:   . Attends Archivist Meetings:   Marland Kitchen Marital Status:      Family History: The  patient's family history includes Breast cancer in his mother; Heart Problems in his father and mother.  ROS:   Please see the history of present illness.    He does not smoke.  He has been under stress and has had depression.  He seems to be doing better.  He is back in maintenance cardiac rehab and this is helping his overall psychological/mental state.  All other systems reviewed and are negative.  EKGs/Labs/Other Studies Reviewed:    The following studies were reviewed today: No new data  EKG:  EKG sinus rhythm, left anterior hemiblock, incomplete right bundle branch block, and first-degree AV block.  No change compared to prior tracings.  Recent Labs: No results found for requested labs within last 8760 hours.  Recent Lipid Panel No results found for: CHOL, TRIG, HDL, CHOLHDL, VLDL, LDLCALC,  LDLDIRECT  Physical Exam:    VS:  BP 128/80   Pulse 91   Ht 5\' 8"  (1.727 m)   Wt 158 lb 12.8 oz (72 kg)   SpO2 96%   BMI 24.15 kg/m     Wt Readings from Last 3 Encounters:  06/21/19 158 lb 12.8 oz (72 kg)  06/12/19 159 lb 6.4 oz (72.3 kg)  06/05/19 155 lb (70.3 kg)     GEN: See appearing. No acute distress HEENT: Normal NECK: No JVD. LYMPHATICS: No lymphadenopathy CARDIAC:  RRR without murmur, gallop, or edema. VASCULAR:  Normal Pulses. No bruits. RESPIRATORY:  Clear to auscultation without rales, wheezing or rhonchi  ABDOMEN: Soft, non-tender, non-distended, No pulsatile mass, MUSCULOSKELETAL: No deformity  SKIN: Warm and dry NEUROLOGIC:  Alert and oriented x 3 PSYCHIATRIC:  Normal affect   ASSESSMENT:    1. Hx of mitral valve repair   2. 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 Essential hypertension, benign   4. Mixed hyperlipidemia   5. Educated about COVID-19 virus infection    PLAN:    In order of problems listed above:  1. Stable without auscultatory evidence of mitral regurgitation recurrence. 2. Primary prevention is discussed.  Continue maintenance phase 3 cardiac rehab.  I do not believe ischemic evaluation is necessary in the absence of symptoms and with good risk factor modification. 3. Target 130/80 mmHg.  Continue exercise and salt restriction.  No medication is required currently 4. Continue moderate intensity simvastatin therapy for target LDL less than 70. 5. COVID-19 vaccination has been received.  Social distancing is being practiced.  Overall education and awareness concerning primary/secondary risk prevention was discussed in detail: LDL less than 70, hemoglobin A1c less than 7, blood pressure target less than 130/80 mmHg, >150 minutes of moderate aerobic activity per week, avoidance of smoking, weight control (via diet and exercise), and continued surveillance/management of/for obstructive sleep  apnea.   Medication Adjustments/Labs and Tests Ordered: Current medicines are reviewed at length with the patient today.  Concerns regarding medicines are outlined above.  Orders Placed This Encounter  Procedures  . EKG 12-Lead   No orders of the defined types were placed in this encounter.   Patient Instructions  Medication Instructions:  Your physician recommends that you continue on your current medications as directed. Please refer to the Current Medication list given to you today.  *If you need a refill on your cardiac medications before your next appointment, please call your pharmacy*   Lab Work: None If you have labs (blood work) drawn today and your tests are completely normal, you will receive your results  only by: Marland Kitchen MyChart Message (if you have MyChart) OR . A paper copy in the mail If you have any lab test that is abnormal or we need to change your treatment, we will call you to review the results.   Testing/Procedures: None   Follow-Up: At [facility], you and your health needs are our priority.  As part of our continuing mission to provide you with exceptional heart care, we have created designated Provider Care Teams.  These Care Teams include your primary Cardiologist (physician) and Advanced Practice Providers (APPs -  Physician Assistants and Nurse Practitioners) who all work together to provide you with the care you need, when you need it.  We recommend signing up for the patient portal called "MyChart".  Sign up information is provided on this After Visit Summary.  MyChart is used to connect with patients for Virtual Visits (Telemedicine).  Patients are able to view lab/test results, encounter notes, upcoming appointments, etc.  Non-urgent messages can be sent to your provider as well.   To learn more about what you can do with MyChart, go to [email or web address].    Your next appointment:   12 month(s)  The format for your next appointment:   In  Person  Provider:   You may see Sinclair Grooms, MD or one of the following Advanced Practice Providers on your designated Care Team:    Truitt Merle, NP  Cecilie Kicks, NP  Kathyrn Drown, NP    Other Instructions  Your provider recommends that you maintain 150 minutes per week of moderate aerobic activity.     Signed, Sinclair Grooms, MD  06/21/2019 12:58 PM    [place]

## 2019-06-21 ENCOUNTER — Ambulatory Visit: Payer: Medicare HMO | Admitting: Interventional Cardiology

## 2019-06-21 ENCOUNTER — Other Ambulatory Visit: Payer: Self-pay

## 2019-06-21 ENCOUNTER — Encounter: Payer: Self-pay | Admitting: Interventional Cardiology

## 2019-06-21 VITALS — BP 128/80 | HR 91 | Ht 68.0 in | Wt 158.8 lb

## 2019-06-21 DIAGNOSIS — I2510 Atherosclerotic heart disease of native coronary artery without angina pectoris: Secondary | ICD-10-CM

## 2019-06-21 DIAGNOSIS — Z7189 Other specified counseling: Secondary | ICD-10-CM

## 2019-06-21 DIAGNOSIS — Z9889 Other specified postprocedural states: Secondary | ICD-10-CM | POA: Diagnosis not present

## 2019-06-21 DIAGNOSIS — I10 Essential (primary) hypertension: Secondary | ICD-10-CM | POA: Diagnosis not present

## 2019-06-21 DIAGNOSIS — E782 Mixed hyperlipidemia: Secondary | ICD-10-CM | POA: Diagnosis not present

## 2019-06-21 NOTE — Patient Instructions (Signed)
Medication Instructions:  Your physician recommends that you continue on your current medications as directed. Please refer to the Current Medication list given to you today.  *If you need a refill on your cardiac medications before your next appointment, please call your pharmacy*   Lab Work: None If you have labs (blood work) drawn today and your tests are completely normal, you will receive your results only by: . MyChart Message (if you have MyChart) OR . A paper copy in the mail If you have any lab test that is abnormal or we need to change your treatment, we will call you to review the results.   Testing/Procedures: None   Follow-Up: At [facility], you and your health needs are our priority.  As part of our continuing mission to provide you with exceptional heart care, we have created designated Provider Care Teams.  These Care Teams include your primary Cardiologist (physician) and Advanced Practice Providers (APPs -  Physician Assistants and Nurse Practitioners) who all work together to provide you with the care you need, when you need it.  We recommend signing up for the patient portal called "MyChart".  Sign up information is provided on this After Visit Summary.  MyChart is used to connect with patients for Virtual Visits (Telemedicine).  Patients are able to view lab/test results, encounter notes, upcoming appointments, etc.  Non-urgent messages can be sent to your provider as well.   To learn more about what you can do with MyChart, go to [email or web address].    Your next appointment:   12 month(s)  The format for your next appointment:   In Person  Provider:   You may see Henry W Smith III, MD or one of the following Advanced Practice Providers on your designated Care Team:    Lori Gerhardt, NP  Laura Ingold, NP  Jill McDaniel, NP    Other Instructions  Your provider recommends that you maintain 150 minutes per week of moderate aerobic activity.   

## 2019-06-26 NOTE — Progress Notes (Signed)
[facility] PREP Weekly Session   Patient Details  Name: Jerry Tanner MRN: [id number] Date of Birth: Aug 26, 1950 Age: 69 y.o. PCP: Josetta Huddle, MD  Vitals:   06/26/19 1627  Weight: 160 lb (72.6 kg)    [name] Weekly seesion - 06/26/19 1600      Weekly Session   Topic Discussed  Hitting roadblocks   stretching handout    Minutes exercised this week  110 minutes    Classes attended to date  28     Fun things since last meeting: watched grandson play soccer Grateful for : health and family Nutrition celebration: ate more salmon Barriers/struggles: still too many sweets, not doing strength training    Barnett Hatter 06/26/2019, 4:28 PM

## 2019-06-29 DIAGNOSIS — F329 Major depressive disorder, single episode, unspecified: Secondary | ICD-10-CM | POA: Diagnosis not present

## 2019-06-29 DIAGNOSIS — R69 Illness, unspecified: Secondary | ICD-10-CM | POA: Diagnosis not present

## 2019-06-29 DIAGNOSIS — E7800 Pure hypercholesterolemia, unspecified: Secondary | ICD-10-CM | POA: Diagnosis not present

## 2019-06-29 DIAGNOSIS — I2510 Atherosclerotic heart disease of native coronary artery without angina pectoris: Secondary | ICD-10-CM | POA: Diagnosis not present

## 2019-06-29 DIAGNOSIS — I4891 Unspecified atrial fibrillation: Secondary | ICD-10-CM | POA: Diagnosis not present

## 2019-07-02 ENCOUNTER — Ambulatory Visit (INDEPENDENT_AMBULATORY_CARE_PROVIDER_SITE_OTHER): Payer: Medicare HMO | Admitting: Otolaryngology

## 2019-07-02 ENCOUNTER — Encounter (INDEPENDENT_AMBULATORY_CARE_PROVIDER_SITE_OTHER): Payer: Self-pay | Admitting: Otolaryngology

## 2019-07-02 ENCOUNTER — Other Ambulatory Visit: Payer: Self-pay

## 2019-07-02 VITALS — Temp 97.7°F

## 2019-07-02 DIAGNOSIS — H6123 Impacted cerumen, bilateral: Secondary | ICD-10-CM | POA: Diagnosis not present

## 2019-07-02 NOTE — Progress Notes (Signed)
HPI: Jerry Tanner is a 69 y.o. male who presents is referred by his PCP for evaluation of cerumen buildup in his ears.  He has bilateral hearing aids.  He had his ears cleaned recently but apparently still has cerumen buildup.  He is referred here for further cleaning and evaluation..  Past Medical History:  Diagnosis Date  . Atherosclerosis of native coronary artery of native heart without angina pectoris 02/01/2013  . H/O mitral valve repair 02/2009   Past Surgical History:  Procedure Laterality Date  . MITRAL VALVE REPAIR    . TONSILLECTOMY     Social History   Socioeconomic History  . Marital status: Married    Spouse name: Not on file  . Number of children: Not on file  . Years of education: Not on file  . Highest education level: Not on file  Occupational History  . Not on file  Tobacco Use  . Smoking status: Never Smoker  . Smokeless tobacco: Never Used  Substance and Sexual Activity  . Alcohol use: No    Alcohol/week: 0.0 standard drinks  . Drug use: No  . Sexual activity: Not on file  Other Topics Concern  . Not on file  Social History Narrative  . Not on file   Social Determinants of Health   Financial Resource Strain:   . Difficulty of Paying Living Expenses:   Food Insecurity:   . Worried About Charity fundraiser in the Last Year:   . Arboriculturist in the Last Year:   Transportation Needs:   . Film/video editor (Medical):   Marland Kitchen Lack of Transportation (Non-Medical):   Physical Activity:   . Days of Exercise per Week:   . Minutes of Exercise per Session:   Stress:   . Feeling of Stress :   Social Connections:   . Frequency of Communication with Friends and Family:   . Frequency of Social Gatherings with Friends and Family:   . Attends Religious Services:   . Active Member of Clubs or Organizations:   . Attends Archivist Meetings:   Marland Kitchen Marital Status:    Family History  Problem Relation Age of Onset  . Heart Problems Father   .  Heart Problems Mother   . Breast cancer Mother    No Known Allergies Prior to Admission medications   Medication Sig Start Date End Date Taking? Authorizing Provider  amoxicillin (AMOXIL) 500 MG capsule Take 4 capsules by mouth as needed. Prior to dental procedures 12/28/16  Yes [provider]  aspirin EC 81 MG tablet Take 81 mg by mouth daily.   Yes [provider]  buPROPion (ZYBAN) 150 MG 12 hr tablet Take 150 mg by mouth daily.   Yes [provider]  citalopram (CELEXA) 40 MG tablet Take 40 mg by mouth daily.  11/07/12  Yes [provider]  LORazepam (ATIVAN) 0.5 MG tablet Take 0.5 mg by mouth every 8 (eight) hours as needed for anxiety.   Yes [provider]  sertraline (ZOLOFT) 50 MG tablet Take 50 mg by mouth daily. 03/20/19  Yes [provider]  simvastatin (ZOCOR) 20 MG tablet Take 20 mg by mouth daily.  01/12/13  Yes [provider]     Positive ROS: Otherwise negative  All other systems have been reviewed and were otherwise negative with the exception of those mentioned in the HPI and as above.  Physical Exam: Constitutional: Alert, well-appearing, no acute distress Ears: External ears  without lesions or tenderness.  He has moderate cerumen buildup on the left side and minimal cerumen buildup on the right side.  This was cleaned with suction and curettes.  TMs were clear bilaterally with good mobility pneumatic otoscopy. Nasal: External nose without lesions.. Clear nasal passages Oral: Lips and gums without lesions. Tongue and palate mucosa without lesions. Posterior oropharynx clear. Neck: No palpable adenopathy or masses Respiratory: Breathing comfortably  Skin: No facial/neck lesions or rash noted.  Cerumen impaction removal  Date/Time: 07/02/2019 4:00 PM Performed by: Rozetta Nunnery, MD Authorized by: Rozetta Nunnery, MD   Consent:    Consent obtained:  Verbal   Consent given by:  Patient    Risks discussed:  Pain and bleeding Procedure details:    Location:  L ear and R ear   Procedure type: curette and suction   Post-procedure details:    Inspection:  TM intact and canal normal   Hearing quality:  Improved   Patient tolerance of procedure:  Tolerated well, no immediate complications Comments:     TMs were clear bilaterally.    Assessment: Bilateral cerumen buildup. Patient wears bilateral hearing aids.  Plan: He will follow-up as needed   Radene Journey, MD   CC:

## 2019-07-03 NOTE — Progress Notes (Signed)
[facility] PREP Weekly Session   Patient Details  Name: Jerry Tanner MRN: [id number] Date of Birth: 06-20-1950 Age: 69 y.o. PCP: Josetta Huddle, MD  Vitals:   07/03/19 1629  Weight: 158 lb (71.7 kg)    [name] Weekly seesion - 07/03/19 1600      Weekly Session   Topic Discussed  --   goal setting   Minutes exercised this week  160 minutes    Classes attended to date  67     Fun things since last meeting: watched grandson play soccer, Mother's Day out at [place]: Ate more nutrition foods salmon twice this week Barriers/struggles: anxiety, still too much snacking.   Barnett Hatter 07/03/2019, 4:31 PM

## 2019-07-31 NOTE — Progress Notes (Signed)
[facility] [place] Report   Patient Details  Name: Jerry Tanner MRN: [id number] Date of Birth: August 14, 1950 Age: 69 y.o. PCP: Josetta Huddle, MD  Vitals:   07/31/19 1101  BP: (!) 139/93  Pulse: 82  Weight: 160 lb 12.8 oz (72.9 kg)     [name] Eval - 07/31/19 1100      Measurement   Neck measurement  16 Inches    Waist Circumference  35 inches      Information for Trainer   Goals  Gym 3 days per week      Mobility and Daily Activities   I find it easy to walk up or down two or more flights of stairs.  4    I have no trouble taking out the trash.  4    I do housework such as vacuuming and dusting on my own without difficulty.  2    I can easily lift a gallon of milk (8lbs).  4    I can easily walk a mile.  4    I have no trouble reaching into high cupboards or reaching down to pick up something from the floor.  4    I do not have trouble doing out-door work such as Armed forces logistics/support/administrative officer, raking leaves, or gardening.  2      Mobility and Daily Activities   I feel younger than my age.  2    I feel independent.  4    I feel energetic.  2    I live an active life.   2    I feel strong.  2    I feel healthy.  2    I feel active as other people my age.  4      How fit and strong are you.   Fit and Strong Total Score  42      Past Medical History:  Diagnosis Date  . Atherosclerosis of native coronary artery of native heart without angina pectoris 02/01/2013  . H/O mitral valve repair 02/2009   Past Surgical History:  Procedure Laterality Date  . MITRAL VALVE REPAIR    . TONSILLECTOMY     Social History   Tobacco Use  Smoking Status Never Smoker  Smokeless Tobacco Never Used   PREP Final class 07/05/19   Barnett Hatter 07/31/2019, 11:03 AM

## 2019-08-28 DIAGNOSIS — Z8249 Family history of ischemic heart disease and other diseases of the circulatory system: Secondary | ICD-10-CM | POA: Diagnosis not present

## 2019-08-28 DIAGNOSIS — R69 Illness, unspecified: Secondary | ICD-10-CM | POA: Diagnosis not present

## 2019-08-28 DIAGNOSIS — Z7982 Long term (current) use of aspirin: Secondary | ICD-10-CM | POA: Diagnosis not present

## 2019-08-28 DIAGNOSIS — E785 Hyperlipidemia, unspecified: Secondary | ICD-10-CM | POA: Diagnosis not present

## 2019-08-28 DIAGNOSIS — Z008 Encounter for other general examination: Secondary | ICD-10-CM | POA: Diagnosis not present

## 2019-08-28 DIAGNOSIS — Z803 Family history of malignant neoplasm of breast: Secondary | ICD-10-CM | POA: Diagnosis not present

## 2019-09-28 DIAGNOSIS — R69 Illness, unspecified: Secondary | ICD-10-CM | POA: Diagnosis not present

## 2019-09-28 DIAGNOSIS — I4891 Unspecified atrial fibrillation: Secondary | ICD-10-CM | POA: Diagnosis not present

## 2019-09-28 DIAGNOSIS — E7800 Pure hypercholesterolemia, unspecified: Secondary | ICD-10-CM | POA: Diagnosis not present

## 2019-09-28 DIAGNOSIS — I2510 Atherosclerotic heart disease of native coronary artery without angina pectoris: Secondary | ICD-10-CM | POA: Diagnosis not present

## 2019-10-10 DIAGNOSIS — R69 Illness, unspecified: Secondary | ICD-10-CM | POA: Diagnosis not present

## 2019-11-16 DIAGNOSIS — Z23 Encounter for immunization: Secondary | ICD-10-CM | POA: Diagnosis not present

## 2019-12-05 DIAGNOSIS — R69 Illness, unspecified: Secondary | ICD-10-CM | POA: Diagnosis not present

## 2019-12-17 ENCOUNTER — Telehealth: Payer: Self-pay | Admitting: Interventional Cardiology

## 2019-12-17 DIAGNOSIS — R000 Tachycardia, unspecified: Secondary | ICD-10-CM | POA: Diagnosis not present

## 2019-12-17 DIAGNOSIS — R030 Elevated blood-pressure reading, without diagnosis of hypertension: Secondary | ICD-10-CM | POA: Diagnosis not present

## 2019-12-17 DIAGNOSIS — R69 Illness, unspecified: Secondary | ICD-10-CM | POA: Diagnosis not present

## 2019-12-17 NOTE — Telephone Encounter (Signed)
He is on no therapy but if blood pressure continues to run in this range, I would recommend losartan 50 mg/day or amlodipine 5 mg/day.

## 2019-12-17 NOTE — Telephone Encounter (Signed)
Pt states he has been under a lot of stress lately due to his wife and mother's health declining.  Wife has Alzheimer's, which is worsening and mother recently had to go into assisted living.  BP this morning was 155/95.  Yesterday it was 160s/90s.  Pt denies CP, SOB or swelling.  Pt prefers to come into the office to be seen.  Offered appt Thursday with Dr. Tamala Julian but he didn't want to wait that long.  Pt states he will call PCP and see if they can see him and see if they feel he needs to come in to see [place].

## 2019-12-17 NOTE — Telephone Encounter (Signed)
    Pt c/o BP issue: STAT if pt c/o blurred vision, one-sided weakness or slurred speech  1. What are your last 5 BP readings? 155/95  2. Are you having any other symptoms (ex. Dizziness, headache, blurred vision, passed out)? Headache   3. What is your BP issue? Pt said his BP has been elevated, he is in a lot of stress lately due to his mom and his wife's health. He wanted to be seen today but no available. He wanted to speak with a nurse

## 2019-12-17 NOTE — Telephone Encounter (Signed)
Spoke with pt and he is currently at PCP office and they are giving him Metoprolol to treat his BP.  He said he will ask them if he needs to make an appt with Cardiology and call back.  Pt appreciative for call.

## 2020-01-02 DIAGNOSIS — I4891 Unspecified atrial fibrillation: Secondary | ICD-10-CM | POA: Diagnosis not present

## 2020-01-02 DIAGNOSIS — R69 Illness, unspecified: Secondary | ICD-10-CM | POA: Diagnosis not present

## 2020-01-02 DIAGNOSIS — E7800 Pure hypercholesterolemia, unspecified: Secondary | ICD-10-CM | POA: Diagnosis not present

## 2020-01-02 DIAGNOSIS — I2510 Atherosclerotic heart disease of native coronary artery without angina pectoris: Secondary | ICD-10-CM | POA: Diagnosis not present

## 2020-01-09 DIAGNOSIS — R030 Elevated blood-pressure reading, without diagnosis of hypertension: Secondary | ICD-10-CM | POA: Diagnosis not present

## 2020-01-09 DIAGNOSIS — R69 Illness, unspecified: Secondary | ICD-10-CM | POA: Diagnosis not present

## 2020-01-09 DIAGNOSIS — Z658 Other specified problems related to psychosocial circumstances: Secondary | ICD-10-CM | POA: Diagnosis not present

## 2020-01-09 DIAGNOSIS — R000 Tachycardia, unspecified: Secondary | ICD-10-CM | POA: Diagnosis not present

## 2020-03-03 DIAGNOSIS — I110 Hypertensive heart disease with heart failure: Secondary | ICD-10-CM | POA: Diagnosis not present

## 2020-03-03 DIAGNOSIS — I739 Peripheral vascular disease, unspecified: Secondary | ICD-10-CM | POA: Diagnosis not present

## 2020-03-03 DIAGNOSIS — I2510 Atherosclerotic heart disease of native coronary artery without angina pectoris: Secondary | ICD-10-CM | POA: Diagnosis not present

## 2020-03-03 DIAGNOSIS — I509 Heart failure, unspecified: Secondary | ICD-10-CM | POA: Diagnosis not present

## 2020-03-03 DIAGNOSIS — J309 Allergic rhinitis, unspecified: Secondary | ICD-10-CM | POA: Diagnosis not present

## 2020-03-03 DIAGNOSIS — D6869 Other thrombophilia: Secondary | ICD-10-CM | POA: Diagnosis not present

## 2020-03-03 DIAGNOSIS — R69 Illness, unspecified: Secondary | ICD-10-CM | POA: Diagnosis not present

## 2020-03-03 DIAGNOSIS — I4891 Unspecified atrial fibrillation: Secondary | ICD-10-CM | POA: Diagnosis not present

## 2020-03-03 DIAGNOSIS — E785 Hyperlipidemia, unspecified: Secondary | ICD-10-CM | POA: Diagnosis not present

## 2020-03-03 DIAGNOSIS — Z008 Encounter for other general examination: Secondary | ICD-10-CM | POA: Diagnosis not present

## 2020-03-05 DIAGNOSIS — I2510 Atherosclerotic heart disease of native coronary artery without angina pectoris: Secondary | ICD-10-CM | POA: Diagnosis not present

## 2020-03-05 DIAGNOSIS — I4891 Unspecified atrial fibrillation: Secondary | ICD-10-CM | POA: Diagnosis not present

## 2020-03-05 DIAGNOSIS — R69 Illness, unspecified: Secondary | ICD-10-CM | POA: Diagnosis not present

## 2020-03-05 DIAGNOSIS — E7800 Pure hypercholesterolemia, unspecified: Secondary | ICD-10-CM | POA: Diagnosis not present

## 2020-03-18 NOTE — Progress Notes (Signed)
Cardiology Office Note:    Date:  03/19/2020   ID:  Jerry Tanner, DOB 31-Aug-1950, MRN [id number]  PCP:  Josetta Huddle, MD  Cardiologist:  Sinclair Grooms, MD   Referring MD: Josetta Huddle, MD   Chief Complaint  Patient presents with  . Cardiac Valve Problem  . Coronary Artery Disease  . Hyperlipidemia    History of Present Illness:    Jerry Tanner is a 70 y.o. male with a hx of mitral valve repair 2011, moderate coronary disease by pre-surgical catheterization, essential hypertension, and hyperlipidemia.  Jerry Tanner tells me of his recent family issues.  In 02-19-2023 his wife died of progressive dementia.  In mid January, his beloved mother and patient died of recurring stroke.  He has been isolated because of the COVID-19 pandemic.  He has had some appropriate depression.  He has not experienced cardiovascular symptoms.  He specifically denies chest pain, dyspnea, and palpitations.  Past Medical History:  Diagnosis Date  . Atherosclerosis of native coronary artery of native heart without angina pectoris 02/01/2013  . H/O mitral valve repair 02/2009    Past Surgical History:  Procedure Laterality Date  . MITRAL VALVE REPAIR    . TONSILLECTOMY      Current Medications: Current Meds  Medication Sig  . amoxicillin (AMOXIL) 500 MG capsule Take 4 capsules by mouth as needed. Prior to dental procedures  . aspirin EC 81 MG tablet Take 81 mg by mouth daily.  Marland Kitchen buPROPion (ZYBAN) 150 MG 12 hr tablet Take 150 mg by mouth daily.  Marland Kitchen LORazepam (ATIVAN) 0.5 MG tablet Take 0.5 mg by mouth every 8 (eight) hours as needed for anxiety.  . metoprolol succinate (TOPROL-XL) 25 MG 24 hr tablet Take 25 mg by mouth daily.  . sertraline (ZOLOFT) 50 MG tablet Take 50 mg by mouth daily.  . simvastatin (ZOCOR) 20 MG tablet Take 20 mg by mouth daily.      Allergies:   Patient has no known allergies.   Social History   Socioeconomic History  . Marital status: Married    Spouse name: Not on file  .  Number of children: Not on file  . Years of education: Not on file  . Highest education level: Not on file  Occupational History  . Not on file  Tobacco Use  . Smoking status: Never Smoker  . Smokeless tobacco: Never Used  Substance and Sexual Activity  . Alcohol use: No    Alcohol/week: 0.0 standard drinks  . Drug use: No  . Sexual activity: Not on file  Other Topics Concern  . Not on file  Social History Narrative  . Not on file   Social Determinants of Health   Financial Resource Strain: Not on file  Food Insecurity: Not on file  Transportation Needs: Not on file  Physical Activity: Not on file  Stress: Not on file  Social Connections: Not on file     Family History: The patient's family history includes Breast cancer in his mother; Heart Problems in his father and mother.  ROS:   Please see the history of present illness.    No neurological symptoms.  Denies orthopnea and PND.  No medication side effects.  All other systems reviewed and are negative.  EKGs/Labs/Other Studies Reviewed:    The following studies were reviewed today: No new or recent imaging  EKG:  EKG normal sinus rhythm, biatrial abnormality, left axis deviation, interventricular conduction delay.  Recent Labs: No results found for  requested labs within last 8760 hours.  Recent Lipid Panel No results found for: CHOL, TRIG, HDL, CHOLHDL, VLDL, LDLCALC, LDLDIRECT  Physical Exam:    VS:  BP 122/80 (BP Location: Left Arm, Patient Position: Sitting, Cuff Size: Normal)   Pulse 75   Ht 5\' 8"  (1.727 m)   Wt 169 lb (76.7 kg)   SpO2 96%   BMI 25.70 kg/m     Wt Readings from Last 3 Encounters:  03/19/20 169 lb (76.7 kg)  07/31/19 160 lb 12.8 oz (72.9 kg)  07/03/19 158 lb (71.7 kg)     GEN: Overweight slightly.. No acute distress HEENT: Normal NECK: No JVD. LYMPHATICS: No lymphadenopathy CARDIAC: No murmur. RRR no gallop, or edema. VASCULAR:  Normal Pulses. No bruits. RESPIRATORY:  Clear to  auscultation without rales, wheezing or rhonchi  ABDOMEN: Soft, non-tender, non-distended, No pulsatile mass, MUSCULOSKELETAL: No deformity  SKIN: Warm and dry NEUROLOGIC:  Alert and oriented x 3 PSYCHIATRIC:  Normal affect   ASSESSMENT:    1. Hx of mitral valve repair   2. 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 Essential hypertension, benign   4. Mixed hyperlipidemia   5. Educated about COVID-19 virus infection    PLAN:    In order of problems listed above:  1. Medically stable and without auscultatory evidence of mitral regurgitation 2. Prevention including blood pressure control and lipid management for asymptomatic coronary atherosclerosis.  Also takes an aspirin per day. 3. Blood pressure is being controlled with low-dose metoprolol succinate, 25 mg/day.  This was added by Dr. Inda Merlin, and is doing a great job. 4. Continue Zocor 20 mg/day. 5. Vaccinated and boosted.  Practicing social mitigation. Overall education and awareness concerning primary/secondary risk prevention was discussed in detail: LDL less than 70, hemoglobin A1c less than 7, blood pressure target less than 130/80 mmHg, >150 minutes of moderate aerobic activity per week, avoidance of smoking, weight control (via diet and exercise), and continued surveillance/management of/for obstructive sleep apnea.  Overall education and awareness concerning primary/secondary risk prevention was discussed in detail: LDL less than 70, hemoglobin A1c less than 7, blood pressure target less than 130/80 mmHg, >150 minutes of moderate aerobic activity per week, avoidance of smoking, weight control (via diet and exercise), and continued surveillance/management of/for obstructive sleep apnea.   Medication Adjustments/Labs and Tests Ordered: Current medicines are reviewed at length with the patient today.  Concerns regarding medicines are outlined above.  Orders Placed This Encounter   Procedures  . EKG 12-Lead   No orders of the defined types were placed in this encounter.   Patient Instructions  Medication Instructions:  Your physician recommends that you continue on your current medications as directed. Please refer to the Current Medication list given to you today.  *If you need a refill on your cardiac medications before your next appointment, please call your pharmacy*   Lab Work: None If you have labs (blood work) drawn today and your tests are completely normal, you will receive your results only by: Marland Kitchen MyChart Message (if you have MyChart) OR . A paper copy in the mail If you have any lab test that is abnormal or we need to change your treatment, we will call you to review the results.   Testing/Procedures: None   Follow-Up: At [facility], you and your health needs are our priority.  As part of our continuing mission to provide you with exceptional heart care, we have created designated Provider  Care Teams.  These Care Teams include your primary Cardiologist (physician) and Advanced Practice Providers (APPs -  Physician Assistants and Nurse Practitioners) who all work together to provide you with the care you need, when you need it.  We recommend signing up for the patient portal called "MyChart".  Sign up information is provided on this After Visit Summary.  MyChart is used to connect with patients for Virtual Visits (Telemedicine).  Patients are able to view lab/test results, encounter notes, upcoming appointments, etc.  Non-urgent messages can be sent to your provider as well.   To learn more about what you can do with MyChart, go to [email or web address].    Your next appointment:   9-12 month(s)  The format for your next appointment:   In Person  Provider:   You may see Sinclair Grooms, MD or one of the following Advanced Practice Providers on your designated Care Team:    Kathyrn Drown, NP    Other Instructions       Signed, Sinclair Grooms, MD  03/19/2020 1:04 PM    [place]

## 2020-03-19 ENCOUNTER — Encounter: Payer: Self-pay | Admitting: Interventional Cardiology

## 2020-03-19 ENCOUNTER — Ambulatory Visit: Payer: Medicare HMO | Admitting: Interventional Cardiology

## 2020-03-19 ENCOUNTER — Other Ambulatory Visit: Payer: Self-pay

## 2020-03-19 VITALS — BP 122/80 | HR 75 | Ht 68.0 in | Wt 169.0 lb

## 2020-03-19 DIAGNOSIS — E782 Mixed hyperlipidemia: Secondary | ICD-10-CM

## 2020-03-19 DIAGNOSIS — I10 Essential (primary) hypertension: Secondary | ICD-10-CM

## 2020-03-19 DIAGNOSIS — Z7189 Other specified counseling: Secondary | ICD-10-CM | POA: Diagnosis not present

## 2020-03-19 DIAGNOSIS — Z9889 Other specified postprocedural states: Secondary | ICD-10-CM

## 2020-03-19 DIAGNOSIS — I2510 Atherosclerotic heart disease of native coronary artery without angina pectoris: Secondary | ICD-10-CM | POA: Diagnosis not present

## 2020-03-19 NOTE — Patient Instructions (Signed)
Medication Instructions:  Your physician recommends that you continue on your current medications as directed. Please refer to the Current Medication list given to you today.  *If you need a refill on your cardiac medications before your next appointment, please call your pharmacy*   Lab Work: None If you have labs (blood work) drawn today and your tests are completely normal, you will receive your results only by: Marland Kitchen MyChart Message (if you have MyChart) OR . A paper copy in the mail If you have any lab test that is abnormal or we need to change your treatment, we will call you to review the results.   Testing/Procedures: None   Follow-Up: At [facility], you and your health needs are our priority.  As part of our continuing mission to provide you with exceptional heart care, we have created designated Provider Care Teams.  These Care Teams include your primary Cardiologist (physician) and Advanced Practice Providers (APPs -  Physician Assistants and Nurse Practitioners) who all work together to provide you with the care you need, when you need it.  We recommend signing up for the patient portal called "MyChart".  Sign up information is provided on this After Visit Summary.  MyChart is used to connect with patients for Virtual Visits (Telemedicine).  Patients are able to view lab/test results, encounter notes, upcoming appointments, etc.  Non-urgent messages can be sent to your provider as well.   To learn more about what you can do with MyChart, go to [email or web address].    Your next appointment:   9-12 month(s)  The format for your next appointment:   In Person  Provider:   You may see Sinclair Grooms, MD or one of the following Advanced Practice Providers on your designated Care Team:    Kathyrn Drown, NP    Other Instructions

## 2020-05-06 DIAGNOSIS — R69 Illness, unspecified: Secondary | ICD-10-CM | POA: Diagnosis not present

## 2020-05-06 DIAGNOSIS — I4891 Unspecified atrial fibrillation: Secondary | ICD-10-CM | POA: Diagnosis not present

## 2020-05-06 DIAGNOSIS — E7800 Pure hypercholesterolemia, unspecified: Secondary | ICD-10-CM | POA: Diagnosis not present

## 2020-05-06 DIAGNOSIS — I2510 Atherosclerotic heart disease of native coronary artery without angina pectoris: Secondary | ICD-10-CM | POA: Diagnosis not present

## 2020-05-13 DIAGNOSIS — R69 Illness, unspecified: Secondary | ICD-10-CM | POA: Diagnosis not present

## 2020-05-13 DIAGNOSIS — F322 Major depressive disorder, single episode, severe without psychotic features: Secondary | ICD-10-CM | POA: Diagnosis not present

## 2020-05-13 DIAGNOSIS — I2510 Atherosclerotic heart disease of native coronary artery without angina pectoris: Secondary | ICD-10-CM | POA: Diagnosis not present

## 2020-05-13 DIAGNOSIS — N529 Male erectile dysfunction, unspecified: Secondary | ICD-10-CM | POA: Diagnosis not present

## 2020-05-13 DIAGNOSIS — R000 Tachycardia, unspecified: Secondary | ICD-10-CM | POA: Diagnosis not present

## 2020-06-11 DIAGNOSIS — H6123 Impacted cerumen, bilateral: Secondary | ICD-10-CM | POA: Diagnosis not present

## 2020-06-26 ENCOUNTER — Ambulatory Visit (INDEPENDENT_AMBULATORY_CARE_PROVIDER_SITE_OTHER): Payer: Medicare HMO | Admitting: Otolaryngology

## 2020-06-30 DIAGNOSIS — Z1212 Encounter for screening for malignant neoplasm of rectum: Secondary | ICD-10-CM | POA: Diagnosis not present

## 2020-06-30 DIAGNOSIS — Z1211 Encounter for screening for malignant neoplasm of colon: Secondary | ICD-10-CM | POA: Diagnosis not present

## 2020-07-02 DIAGNOSIS — R69 Illness, unspecified: Secondary | ICD-10-CM | POA: Diagnosis not present

## 2020-07-02 DIAGNOSIS — E7800 Pure hypercholesterolemia, unspecified: Secondary | ICD-10-CM | POA: Diagnosis not present

## 2020-07-02 DIAGNOSIS — I4891 Unspecified atrial fibrillation: Secondary | ICD-10-CM | POA: Diagnosis not present

## 2020-07-02 DIAGNOSIS — I2510 Atherosclerotic heart disease of native coronary artery without angina pectoris: Secondary | ICD-10-CM | POA: Diagnosis not present

## 2020-07-07 LAB — COLOGUARD: COLOGUARD: NEGATIVE

## 2020-07-07 LAB — EXTERNAL GENERIC LAB PROCEDURE: COLOGUARD: NEGATIVE

## 2020-08-04 DIAGNOSIS — R69 Illness, unspecified: Secondary | ICD-10-CM | POA: Diagnosis not present

## 2020-08-04 DIAGNOSIS — E7800 Pure hypercholesterolemia, unspecified: Secondary | ICD-10-CM | POA: Diagnosis not present

## 2020-08-04 DIAGNOSIS — I4891 Unspecified atrial fibrillation: Secondary | ICD-10-CM | POA: Diagnosis not present

## 2020-08-04 DIAGNOSIS — I2510 Atherosclerotic heart disease of native coronary artery without angina pectoris: Secondary | ICD-10-CM | POA: Diagnosis not present

## 2020-08-28 DIAGNOSIS — F418 Other specified anxiety disorders: Secondary | ICD-10-CM | POA: Diagnosis not present

## 2020-08-28 DIAGNOSIS — Z1211 Encounter for screening for malignant neoplasm of colon: Secondary | ICD-10-CM | POA: Diagnosis not present

## 2020-08-28 DIAGNOSIS — Z125 Encounter for screening for malignant neoplasm of prostate: Secondary | ICD-10-CM | POA: Diagnosis not present

## 2020-08-28 DIAGNOSIS — I2510 Atherosclerotic heart disease of native coronary artery without angina pectoris: Secondary | ICD-10-CM | POA: Diagnosis not present

## 2020-08-28 DIAGNOSIS — R69 Illness, unspecified: Secondary | ICD-10-CM | POA: Diagnosis not present

## 2020-08-28 DIAGNOSIS — E7800 Pure hypercholesterolemia, unspecified: Secondary | ICD-10-CM | POA: Diagnosis not present

## 2020-08-28 DIAGNOSIS — F322 Major depressive disorder, single episode, severe without psychotic features: Secondary | ICD-10-CM | POA: Diagnosis not present

## 2020-08-28 DIAGNOSIS — Z7189 Other specified counseling: Secondary | ICD-10-CM | POA: Diagnosis not present

## 2020-08-28 DIAGNOSIS — N529 Male erectile dysfunction, unspecified: Secondary | ICD-10-CM | POA: Diagnosis not present

## 2020-09-23 DIAGNOSIS — E7800 Pure hypercholesterolemia, unspecified: Secondary | ICD-10-CM | POA: Diagnosis not present

## 2020-09-23 DIAGNOSIS — R69 Illness, unspecified: Secondary | ICD-10-CM | POA: Diagnosis not present

## 2020-09-23 DIAGNOSIS — I4891 Unspecified atrial fibrillation: Secondary | ICD-10-CM | POA: Diagnosis not present

## 2020-09-23 DIAGNOSIS — I2510 Atherosclerotic heart disease of native coronary artery without angina pectoris: Secondary | ICD-10-CM | POA: Diagnosis not present

## 2020-11-10 ENCOUNTER — Telehealth: Payer: Self-pay | Admitting: Interventional Cardiology

## 2020-11-10 DIAGNOSIS — I2510 Atherosclerotic heart disease of native coronary artery without angina pectoris: Secondary | ICD-10-CM

## 2020-11-10 DIAGNOSIS — E782 Mixed hyperlipidemia: Secondary | ICD-10-CM

## 2020-11-10 DIAGNOSIS — I10 Essential (primary) hypertension: Secondary | ICD-10-CM

## 2020-11-10 NOTE — Telephone Encounter (Signed)
Left message to call back  

## 2020-11-10 NOTE — Telephone Encounter (Signed)
Jerry Tanner is requesting a heart CAT scan be scheduled and would like a callback to set it up once the order is placed.

## 2020-11-10 NOTE — Telephone Encounter (Signed)
Spoke with pt and he was inquiring about getting a Calcium Score performed.  Explained to pt that with his known atherosclerosis we don't typically do a Calcium Score.  He states he knows he has some build up in one of his main arteries and has just always been concerned about that.  He did mention that he would like to go up on his Simvastatin dose if Dr. Tamala Julian is agreeable.  Looked on [facility] site and last LDL was 85 on 7/7.  Total was 158 and trigs were 178.  Advised I will send to Dr. Tamala Julian to see if he feels it is appropriate to increase pt's statin.

## 2020-11-11 ENCOUNTER — Telehealth: Payer: Self-pay | Admitting: Interventional Cardiology

## 2020-11-11 NOTE — Telephone Encounter (Signed)
Schedule coronary CTA with FFR if indicated Change Simvastatin to Atorvastatin 40 mg daily with Liver and lipid in 6 weeks.

## 2020-11-11 NOTE — Telephone Encounter (Signed)
Spoke with pt and made him aware that message is likely about the appt that was scheduled yesterday.  Advised I will be in touch once I hear back from Dr. Tamala Julian.  Pt appreciative for call.

## 2020-11-11 NOTE — Telephone Encounter (Signed)
Patient states he received a message from [place] on [address], but he is not set up on mychart.

## 2020-11-13 ENCOUNTER — Encounter: Payer: Self-pay | Admitting: *Deleted

## 2020-11-13 MED ORDER — ATORVASTATIN CALCIUM 40 MG PO TABS
40.0000 mg | ORAL_TABLET | Freq: Every day | ORAL | 3 refills | Status: DC
Start: 2020-11-13 — End: 2021-11-05

## 2020-11-13 NOTE — Telephone Encounter (Signed)
Spoke with pt and reviewed information from Dr. Tamala Julian.  Pt states HR usually in the low 70s.  Dr. Tamala Julian recommends taking two of his Metoprolol Succinate 25mg  tablets that day.  Pt agreeable to plan.  He will come for fasting labs on 11/4.  Advised I will put instructions into MyChart and to call if any questions.

## 2020-11-14 ENCOUNTER — Telehealth (HOSPITAL_COMMUNITY): Payer: Self-pay | Admitting: Emergency Medicine

## 2020-11-14 NOTE — Telephone Encounter (Signed)
Reaching out to patient to offer assistance regarding upcoming cardiac imaging study; pt verbalizes understanding of appt date/time, parking situation and where to check in, pre-test NPO status and medications ordered, and verified current allergies; name and call back number provided for further questions should they arise Marchia Bond RN Navigator Cardiac Imaging [facility] [phone] office [phone] cell  Reminded to get labs

## 2020-11-17 ENCOUNTER — Other Ambulatory Visit: Payer: Medicare HMO | Admitting: *Deleted

## 2020-11-17 ENCOUNTER — Telehealth (HOSPITAL_COMMUNITY): Payer: Self-pay | Admitting: Emergency Medicine

## 2020-11-17 ENCOUNTER — Other Ambulatory Visit: Payer: Self-pay

## 2020-11-17 DIAGNOSIS — I2510 Atherosclerotic heart disease of native coronary artery without angina pectoris: Secondary | ICD-10-CM | POA: Diagnosis not present

## 2020-11-17 LAB — BASIC METABOLIC PANEL
BUN/Creatinine Ratio: 13 (ref 10–24)
BUN: 13 mg/dL (ref 8–27)
CO2: 23 mmol/L (ref 20–29)
Calcium: 10 mg/dL (ref 8.6–10.2)
Chloride: 96 mmol/L (ref 96–106)
Creatinine, Ser: 1.04 mg/dL (ref 0.76–1.27)
Glucose: 123 mg/dL — ABNORMAL HIGH (ref 70–99)
Potassium: 4.6 mmol/L (ref 3.5–5.2)
Sodium: 136 mmol/L (ref 134–144)
eGFR: 77 mL/min/{1.73_m2} (ref >59)

## 2020-11-17 NOTE — Telephone Encounter (Signed)
Reaching out to patient to offer assistance regarding upcoming cardiac imaging study; pt verbalizes understanding of appt date/time, parking situation and where to check in, pre-test NPO status and medications ordered, and verified current allergies; name and call back number provided for further questions should they arise Malyah Ohlrich RN Navigator Cardiac Imaging [facility] [phone] office [phone] cell 

## 2020-11-19 ENCOUNTER — Telehealth: Payer: Self-pay | Admitting: Interventional Cardiology

## 2020-11-19 ENCOUNTER — Ambulatory Visit (HOSPITAL_COMMUNITY): Payer: Medicare HMO

## 2020-11-19 DIAGNOSIS — E782 Mixed hyperlipidemia: Secondary | ICD-10-CM

## 2020-11-19 DIAGNOSIS — I10 Essential (primary) hypertension: Secondary | ICD-10-CM

## 2020-11-19 DIAGNOSIS — I2510 Atherosclerotic heart disease of native coronary artery without angina pectoris: Secondary | ICD-10-CM

## 2020-11-19 NOTE — Telephone Encounter (Signed)
  Pt would like to ask Jerry Tanner how he can manage his glucose

## 2020-11-19 NOTE — Telephone Encounter (Signed)
Left message to call back  

## 2020-11-20 ENCOUNTER — Encounter: Payer: Self-pay | Admitting: *Deleted

## 2020-11-20 NOTE — Telephone Encounter (Signed)
Spoke with pt and he was concerned about elevated glucose on recent labs.  Pt was not fasting.  Advised since he was not fasting, not unusual for the glucose to be slightly elevated.   Also mentioned to pt that insurance denied coverage for the Coronary CT so Dr. Tamala Julian wants to have him do an exercise myoview.  Reviewed instructions.  Pt agreeable to plan.    Will route to Dr. Tamala Julian to sign attestation.

## 2020-11-25 ENCOUNTER — Ambulatory Visit (HOSPITAL_COMMUNITY): Payer: Medicare HMO

## 2020-11-26 ENCOUNTER — Telehealth (HOSPITAL_COMMUNITY): Payer: Self-pay | Admitting: *Deleted

## 2020-11-26 NOTE — Telephone Encounter (Signed)
Patient given detailed instructions per Myocardial Perfusion Study Information Sheet for the test on 12/03/20 Patient notified to arrive 15 minutes early and that it is imperative to arrive on time for appointment to keep from having the test rescheduled.  If you need to cancel or reschedule your appointment, please call the office within 24 hours of your appointment. . Patient verbalized understanding.Kirstie Peri

## 2020-11-27 DIAGNOSIS — I2510 Atherosclerotic heart disease of native coronary artery without angina pectoris: Secondary | ICD-10-CM | POA: Diagnosis not present

## 2020-11-27 DIAGNOSIS — R69 Illness, unspecified: Secondary | ICD-10-CM | POA: Diagnosis not present

## 2020-11-27 DIAGNOSIS — E7800 Pure hypercholesterolemia, unspecified: Secondary | ICD-10-CM | POA: Diagnosis not present

## 2020-11-27 DIAGNOSIS — I4891 Unspecified atrial fibrillation: Secondary | ICD-10-CM | POA: Diagnosis not present

## 2020-12-03 ENCOUNTER — Other Ambulatory Visit: Payer: Self-pay

## 2020-12-03 ENCOUNTER — Ambulatory Visit (HOSPITAL_COMMUNITY): Payer: Medicare HMO | Attending: Internal Medicine

## 2020-12-03 DIAGNOSIS — I10 Essential (primary) hypertension: Secondary | ICD-10-CM | POA: Insufficient documentation

## 2020-12-03 DIAGNOSIS — I2510 Atherosclerotic heart disease of native coronary artery without angina pectoris: Secondary | ICD-10-CM | POA: Diagnosis not present

## 2020-12-03 DIAGNOSIS — E782 Mixed hyperlipidemia: Secondary | ICD-10-CM | POA: Insufficient documentation

## 2020-12-03 LAB — MYOCARDIAL PERFUSION IMAGING
Angina Index: 0
Duke Treadmill Score: 6
Estimated workload: 7
Exercise duration (min): 6 min
Exercise duration (sec): 0 s
LV dias vol: 61 mL (ref 62–150)
LV sys vol: 19 mL
MPHR: 149 {beats}/min
Nuc Stress EF: 70 %
Peak HR: 141 {beats}/min
Percent HR: 94 %
Rest HR: 89 {beats}/min
Rest Nuclear Isotope Dose: 10.2 mCi
SDS: 0
SRS: 0
SSS: 0
ST Depression (mm): 0 mm
Stress Nuclear Isotope Dose: 30.4 mCi
TID: 1.1

## 2020-12-03 MED ORDER — TECHNETIUM TC 99M TETROFOSMIN IV KIT
10.2000 | PACK | Freq: Once | INTRAVENOUS | Status: AC | PRN
  Administered 2020-12-03: 10.2 via INTRAVENOUS
  Filled 2020-12-03: qty 11

## 2020-12-03 MED ORDER — TECHNETIUM TC 99M TETROFOSMIN IV KIT
30.4000 | PACK | Freq: Once | INTRAVENOUS | Status: AC | PRN
  Administered 2020-12-03: 30.4 via INTRAVENOUS
  Filled 2020-12-03: qty 31

## 2020-12-26 ENCOUNTER — Other Ambulatory Visit: Payer: Medicare HMO | Admitting: *Deleted

## 2020-12-26 ENCOUNTER — Other Ambulatory Visit: Payer: Self-pay

## 2020-12-26 DIAGNOSIS — E782 Mixed hyperlipidemia: Secondary | ICD-10-CM | POA: Diagnosis not present

## 2020-12-26 LAB — HEPATIC FUNCTION PANEL
ALT: 28 IU/L (ref 0–44)
AST: 20 IU/L (ref 0–40)
Albumin: 5.1 g/dL — ABNORMAL HIGH (ref 3.7–4.7)
Alkaline Phosphatase: 53 IU/L (ref 44–121)
Bilirubin Total: 0.7 mg/dL (ref 0.0–1.2)
Bilirubin, Direct: 0.18 mg/dL (ref 0.00–0.40)
Total Protein: 7.3 g/dL (ref 6.0–8.5)

## 2020-12-26 LAB — LIPID PANEL
Chol/HDL Ratio: 2.7 ratio (ref 0.0–5.0)
Cholesterol, Total: 133 mg/dL (ref 100–199)
HDL: 49 mg/dL (ref >39)
LDL Chol Calc (NIH): 65 mg/dL (ref 0–99)
Triglycerides: 103 mg/dL (ref 0–149)
VLDL Cholesterol Cal: 19 mg/dL (ref 5–40)

## 2021-03-20 DIAGNOSIS — H6122 Impacted cerumen, left ear: Secondary | ICD-10-CM | POA: Diagnosis not present

## 2021-03-20 DIAGNOSIS — J019 Acute sinusitis, unspecified: Secondary | ICD-10-CM | POA: Diagnosis not present

## 2021-04-12 NOTE — Progress Notes (Signed)
Cardiology Office Note:    Date:  04/12/2021   ID:  Jerry Tanner, DOB 28-Dec-1950, MRN [id number]  PCP:  Josetta Huddle, MD  Cardiologist:  Sinclair Grooms, MD   Referring MD: Josetta Huddle, MD   No chief complaint on file.   History of Present Illness:    Jerry Tanner is a 71 y.o. male with a hx of  mitral valve repair 2011, moderate coronary disease by pre-surgical catheterization, essential hypertension, and hyperlipidemia.   He denies angina and CV symptoms.  No orthopnea, PND, or other cardiac complaints.  Past Medical History:  Diagnosis Date   Atherosclerosis of native coronary artery of native heart without angina pectoris 02/01/2013   H/O mitral valve repair 02/2009    Past Surgical History:  Procedure Laterality Date   MITRAL VALVE REPAIR     TONSILLECTOMY      Current Medications: No outpatient medications have been marked as taking for the 04/13/21 encounter (Appointment) with Belva Crome, MD.     Allergies:   Patient has no known allergies.   Social History   Socioeconomic History   Marital status: Widowed    Spouse name: Not on file   Number of children: Not on file   Years of education: Not on file   Highest education level: Not on file  Occupational History   Not on file  Tobacco Use   Smoking status: Never   Smokeless tobacco: Never  Substance and Sexual Activity   Alcohol use: No    Alcohol/week: 0.0 standard drinks   Drug use: No   Sexual activity: Not on file  Other Topics Concern   Not on file  Social History Narrative   Not on file   Social Determinants of Health   Financial Resource Strain: Not on file  Food Insecurity: Not on file  Transportation Needs: Not on file  Physical Activity: Not on file  Stress: Not on file  Social Connections: Not on file     Family History: The patient's family history includes Breast cancer in his mother; Heart Problems in his father and mother.  ROS:   Please see the history of present  illness.    Snores on occasion.  All other systems reviewed and are negative.  EKGs/Labs/Other Studies Reviewed:    The following studies were reviewed today:  MYOCARDIAL PERFUSION 12/03/2020: Study Highlights      The study is normal. The study is low risk.   No ST deviation was noted.   LV perfusion is normal.   Left ventricular function is normal. Nuclear stress EF: 70 %. The left ventricular ejection fraction is hyperdynamic (>65%). End diastolic cavity size is normal. End systolic cavity size is normal.   Prior study not available for comparison.   Normal perfusion. LVEF 70% with normal wall motion. This is a low risk study. No prior for comparison.    2 D DOPPLER ECHOCARDIOGRAM 09/2017 Study Conclusions   - Left ventricle: The cavity size was normal. Systolic function was    normal. The estimated ejection fraction was in the range of 55%    to 60%. Wall motion was normal; there were no regional wall    motion abnormalities. Doppler parameters are consistent with    abnormal left ventricular relaxation (grade 1 diastolic    dysfunction). Doppler parameters are consistent with high    ventricular filling pressure.  - Aortic valve: Transvalvular velocity was within the normal range.    There was no  stenosis. There was no regurgitation.  - Mitral valve: Prior procedures included surgical repair. The    findings are consistent with mild stenosis. There was no    regurgitation. Pressure half-time: 113 ms. Mean gradient (D): 8    mm Hg. Valve area by continuity equation (using LVOT flow): 2.26    cm^2.  - Right ventricle: The cavity size was normal. Wall thickness was    normal. Systolic function was normal.  - Atrial septum: No defect or patent foramen ovale was identified    by color flow Doppler.  - Tricuspid valve: There was trivial regurgitation.  - Pulmonary arteries: Systolic pressure was within the normal    range. PA peak pressure: 18 mm Hg (S).   EKG:  EKG  reveals sinus rhythm, biatrial abnormality, relatively low voltage, left anterior hemiblock.  Change when compared to 2022  Recent Labs: 11/17/2020: BUN 13; Creatinine, Ser 1.04; Potassium 4.6; Sodium 136 12/26/2020: ALT 28  Recent Lipid Panel    Component Value Date/Time   CHOL 133 12/26/2020 1112   TRIG 103 12/26/2020 1112   HDL 49 12/26/2020 1112   CHOLHDL 2.7 12/26/2020 1112   LDLCALC 65 12/26/2020 1112    Physical Exam:    VS:  There were no vitals taken for this visit.    Wt Readings from Last 3 Encounters:  12/03/20 169 lb (76.7 kg)  03/19/20 169 lb (76.7 kg)  07/31/19 160 lb 12.8 oz (72.9 kg)     GEN: Slightly overweight. No acute distress HEENT: Normal NECK: No JVD. LYMPHATICS: No lymphadenopathy CARDIAC: No murmur. RRR no gallop, or edema. VASCULAR:  Normal Pulses. No bruits. RESPIRATORY:  Clear to auscultation without rales, wheezing or rhonchi  ABDOMEN: Soft, non-tender, non-distended, No pulsatile mass, MUSCULOSKELETAL: No deformity  SKIN: Warm and dry NEUROLOGIC:  Alert and oriented x 3 PSYCHIATRIC:  Normal affect   ASSESSMENT:    1. Hx of mitral valve repair   2. 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 Mixed hyperlipidemia   4. Essential hypertension, benign    PLAN:    In order of problems listed above:  No clinical might regurgitation on auscultation. Continue high intensity statin therapy.  Most recent LDL was 65 in November. See #2 above. Target 130/80 mmHg.  Continue Toprol-XL.  Overall education and awareness concerning primary risk prevention was discussed in detail: LDL less than 70, hemoglobin A1c less than 7, blood pressure target less than 130/80 mmHg, >150 minutes of moderate aerobic activity per week, avoidance of smoking, weight control (via diet and exercise), and continued surveillance/management of/for obstructive sleep apnea.    Medication Adjustments/Labs and Tests  Ordered: Current medicines are reviewed at length with the patient today.  Concerns regarding medicines are outlined above.  No orders of the defined types were placed in this encounter.  No orders of the defined types were placed in this encounter.   There are no Patient Instructions on file for this visit.   Signed, Sinclair Grooms, MD  04/12/2021 11:36 AM    [place]

## 2021-04-13 ENCOUNTER — Ambulatory Visit: Payer: Medicare HMO | Admitting: Interventional Cardiology

## 2021-04-13 ENCOUNTER — Other Ambulatory Visit: Payer: Self-pay

## 2021-04-13 ENCOUNTER — Encounter: Payer: Self-pay | Admitting: Interventional Cardiology

## 2021-04-13 VITALS — BP 122/80 | HR 71 | Ht 67.5 in | Wt 178.2 lb

## 2021-04-13 DIAGNOSIS — I10 Essential (primary) hypertension: Secondary | ICD-10-CM | POA: Diagnosis not present

## 2021-04-13 DIAGNOSIS — E782 Mixed hyperlipidemia: Secondary | ICD-10-CM

## 2021-04-13 DIAGNOSIS — I2510 Atherosclerotic heart disease of native coronary artery without angina pectoris: Secondary | ICD-10-CM | POA: Diagnosis not present

## 2021-04-13 DIAGNOSIS — Z9889 Other specified postprocedural states: Secondary | ICD-10-CM | POA: Diagnosis not present

## 2021-04-13 NOTE — Patient Instructions (Signed)
Medication Instructions:  °Your physician recommends that you continue on your current medications as directed. Please refer to the Current Medication list given to you today. ° °*If you need a refill on your cardiac medications before your next appointment, please call your pharmacy* ° ° °Lab Work: °NONE °If you have labs (blood work) drawn today and your tests are completely normal, you will receive your results only by: °MyChart Message (if you have MyChart) OR °A paper copy in the mail °If you have any lab test that is abnormal or we need to change your treatment, we will call you to review the results. ° ° °Testing/Procedures: °NONE ° ° °Follow-Up: °At [facility], you and your health needs are our priority.  As part of our continuing mission to provide you with exceptional heart care, we have created designated Provider Care Teams.  These Care Teams include your primary Cardiologist (physician) and Advanced Practice Providers (APPs -  Physician Assistants and Nurse Practitioners) who all work together to provide you with the care you need, when you need it. ° °We recommend signing up for the patient portal called "MyChart".  Sign up information is provided on this After Visit Summary.  MyChart is used to connect with patients for Virtual Visits (Telemedicine).  Patients are able to view lab/test results, encounter notes, upcoming appointments, etc.  Non-urgent messages can be sent to your provider as well.   °To learn more about what you can do with MyChart, go to [email or web address].   ° °Your next appointment:   °1 year(s) ° °The format for your next appointment:   °In Person ° °Provider:   °Henry W Smith III, MD   ° °

## 2021-07-01 DIAGNOSIS — R69 Illness, unspecified: Secondary | ICD-10-CM | POA: Diagnosis not present

## 2021-07-01 DIAGNOSIS — Z8249 Family history of ischemic heart disease and other diseases of the circulatory system: Secondary | ICD-10-CM | POA: Diagnosis not present

## 2021-07-01 DIAGNOSIS — R030 Elevated blood-pressure reading, without diagnosis of hypertension: Secondary | ICD-10-CM | POA: Diagnosis not present

## 2021-07-01 DIAGNOSIS — Z008 Encounter for other general examination: Secondary | ICD-10-CM | POA: Diagnosis not present

## 2021-07-01 DIAGNOSIS — Z7982 Long term (current) use of aspirin: Secondary | ICD-10-CM | POA: Diagnosis not present

## 2021-07-01 DIAGNOSIS — E785 Hyperlipidemia, unspecified: Secondary | ICD-10-CM | POA: Diagnosis not present

## 2021-09-14 DIAGNOSIS — F322 Major depressive disorder, single episode, severe without psychotic features: Secondary | ICD-10-CM | POA: Diagnosis not present

## 2021-09-14 DIAGNOSIS — Z0000 Encounter for general adult medical examination without abnormal findings: Secondary | ICD-10-CM | POA: Diagnosis not present

## 2021-09-14 DIAGNOSIS — Z1211 Encounter for screening for malignant neoplasm of colon: Secondary | ICD-10-CM | POA: Diagnosis not present

## 2021-09-14 DIAGNOSIS — N529 Male erectile dysfunction, unspecified: Secondary | ICD-10-CM | POA: Diagnosis not present

## 2021-09-14 DIAGNOSIS — E7800 Pure hypercholesterolemia, unspecified: Secondary | ICD-10-CM | POA: Diagnosis not present

## 2021-09-14 DIAGNOSIS — I2510 Atherosclerotic heart disease of native coronary artery without angina pectoris: Secondary | ICD-10-CM | POA: Diagnosis not present

## 2021-09-14 DIAGNOSIS — R69 Illness, unspecified: Secondary | ICD-10-CM | POA: Diagnosis not present

## 2021-09-14 DIAGNOSIS — Z125 Encounter for screening for malignant neoplasm of prostate: Secondary | ICD-10-CM | POA: Diagnosis not present

## 2021-09-14 DIAGNOSIS — M545 Low back pain, unspecified: Secondary | ICD-10-CM | POA: Diagnosis not present

## 2021-09-14 DIAGNOSIS — F418 Other specified anxiety disorders: Secondary | ICD-10-CM | POA: Diagnosis not present

## 2021-11-05 ENCOUNTER — Other Ambulatory Visit: Payer: Self-pay | Admitting: Interventional Cardiology

## 2021-11-12 DIAGNOSIS — R6889 Other general symptoms and signs: Secondary | ICD-10-CM | POA: Diagnosis not present

## 2021-12-04 ENCOUNTER — Other Ambulatory Visit (HOSPITAL_BASED_OUTPATIENT_CLINIC_OR_DEPARTMENT_OTHER): Payer: Self-pay

## 2021-12-04 MED ORDER — COVID-19 MRNA 2023-2024 VACCINE (COMIRNATY) 0.3 ML INJECTION
INTRAMUSCULAR | 0 refills | Status: AC
  Filled 2021-12-04: qty 0.3, 1d supply, fill #0

## 2021-12-21 ENCOUNTER — Other Ambulatory Visit (HOSPITAL_BASED_OUTPATIENT_CLINIC_OR_DEPARTMENT_OTHER): Payer: Self-pay

## 2021-12-21 MED ORDER — INFLUENZA VAC A&B SA ADJ QUAD 0.5 ML IM PRSY
PREFILLED_SYRINGE | INTRAMUSCULAR | 0 refills | Status: AC
  Filled 2021-12-21: qty 0.5, 1d supply, fill #0

## 2021-12-29 ENCOUNTER — Other Ambulatory Visit (HOSPITAL_BASED_OUTPATIENT_CLINIC_OR_DEPARTMENT_OTHER): Payer: Self-pay

## 2021-12-29 MED ORDER — AREXVY 120 MCG/0.5ML IM SUSR
INTRAMUSCULAR | 0 refills | Status: AC
  Filled 2021-12-29: qty 0.5, 1d supply, fill #0

## 2022-02-03 NOTE — Progress Notes (Unsigned)
Cardiology Office Note:    Date:  02/04/2022   ID:  Jerry Tanner, DOB 04-01-1950, MRN [id number]  PCP:  Josetta Huddle, MD  Cardiologist:  Sinclair Grooms, MD   Referring MD: Josetta Huddle, MD   Chief Complaint  Patient presents with   Cardiac Valve Problem    Status post mitral valve repair   Hyperlipidemia   Coronary Artery Disease   Hypertension    History of Present Illness:    Jerry Tanner is a 71 y.o. male with a hx of   mitral valve repair 2011, moderate coronary disease by pre-surgical catheterization, essential hypertension, and hyperlipidemia.     Is doing well.  He misses the maintenance program in cardiac rehab.  He blames his weight gain on not being able to participate in that program on a regular basis.  Also states that his diet is not optimal.  He denies chest pain, orthopnea, PND, and edema.  No palpitations.  Past Medical History:  Diagnosis Date   Atherosclerosis of native coronary artery of native heart without angina pectoris 02/01/2013   H/O mitral valve repair 02/2009    Past Surgical History:  Procedure Laterality Date   MITRAL VALVE REPAIR     TONSILLECTOMY      Current Medications: Current Meds  Medication Sig   acetaminophen (TYLENOL) 500 MG tablet Take 2 tablets by mouth as needed.   ALPRAZolam (XANAX) 0.5 MG tablet Take 1 tablet by mouth as needed.   amoxicillin (AMOXIL) 500 MG capsule Take 4 capsules by mouth as needed. Prior to dental procedures   Ascorbic Acid (VITAMIN C) 1000 MG tablet Take 1 tablet by mouth daily.   aspirin EC 81 MG tablet Take 81 mg by mouth daily.   atorvastatin (LIPITOR) 40 MG tablet Take 1 tablet (40 mg total) by mouth daily. Please call [phone] to schedule an appointment for future refills. Thank you.   buPROPion (ZYBAN) 150 MG 12 hr tablet Take 150 mg by mouth daily.   cholecalciferol (VITAMIN D3) 25 MCG (1000 UNIT) tablet Take 1 tablet by mouth daily.   COVID-19 mRNA vaccine 2023-2024 (COMIRNATY)  SUSP injection Inject into the muscle.   doxycycline (ADOXA) 100 MG tablet Take 100 mg by mouth 2 (two) times daily.   influenza vaccine adjuvanted (FLUAD) 0.5 ML injection Inject into the muscle.   LORazepam (ATIVAN) 0.5 MG tablet Take 0.5 mg by mouth every 8 (eight) hours as needed for anxiety.   Meclizine HCl 25 MG CHEW Chew 1 tablet by mouth as needed.   metoprolol succinate (TOPROL-XL) 25 MG 24 hr tablet Take 25 mg by mouth daily.   RSV vaccine recomb adjuvanted (AREXVY) 120 MCG/0.5ML injection Inject into the muscle.   sertraline (ZOLOFT) 50 MG tablet Take 50 mg by mouth daily.   sildenafil (REVATIO) 20 MG tablet Take 3 tablets by mouth as needed.     Allergies:   Patient has no known allergies.   Social History   Socioeconomic History   Marital status: Widowed    Spouse name: Not on file   Number of children: Not on file   Years of education: Not on file   Highest education level: Not on file  Occupational History   Not on file  Tobacco Use   Smoking status: Never   Smokeless tobacco: Never  Substance and Sexual Activity   Alcohol use: No    Alcohol/week: 0.0 standard drinks of alcohol   Drug use: No   Sexual activity:  Not on file  Other Topics Concern   Not on file  Social History Narrative   Not on file   Social Determinants of Health   Financial Resource Strain: Not on file  Food Insecurity: Not on file  Transportation Needs: Not on file  Physical Activity: Not on file  Stress: Not on file  Social Connections: Not on file     Family History: The patient's family history includes Breast cancer in his mother; Heart Problems in his father and mother.  ROS:   Please see the history of present illness.    He thanked me for the care that I gave to his mother and his father and also for his particular issues.  All other systems reviewed and are negative.  EKGs/Labs/Other Studies Reviewed:    The following studies were reviewed today: 2D Doppler  echocardiogram 2019: Study Conclusions   - Left ventricle: The cavity size was normal. Systolic function was    normal. The estimated ejection fraction was in the range of 55%    to 60%. Wall motion was normal; there were no regional wall    motion abnormalities. Doppler parameters are consistent with    abnormal left ventricular relaxation (grade 1 diastolic    dysfunction). Doppler parameters are consistent with high    ventricular filling pressure.  - Aortic valve: Transvalvular velocity was within the normal range.    There was no stenosis. There was no regurgitation.  - Mitral valve: Prior procedures included surgical repair. The    findings are consistent with mild stenosis. There was no    regurgitation. Pressure half-time: 113 ms. Mean gradient (D): 8    mm Hg. Valve area by continuity equation (using LVOT flow): 2.26    cm^2.  - Right ventricle: The cavity size was normal. Wall thickness was    normal. Systolic function was normal.  - Atrial septum: No defect or patent foramen ovale was identified    by color flow Doppler.  - Tricuspid valve: There was trivial regurgitation.  - Pulmonary arteries: Systolic pressure was within the normal    range. PA peak pressure: 18 mm Hg (S).   EKG:  EKG normal sinus rhythm, left anterior hemiblock, first-degree AV block.  Recent Labs: No results found for requested labs within last 365 days.  Recent Lipid Panel    Component Value Date/Time   CHOL 133 12/26/2020 1112   TRIG 103 12/26/2020 1112   HDL 49 12/26/2020 1112   CHOLHDL 2.7 12/26/2020 1112   LDLCALC 65 12/26/2020 1112    Physical Exam:    VS:  BP 132/88   Pulse 64   Ht 5' 7.5" (1.715 m)   Wt 181 lb 3.2 oz (82.2 kg)   SpO2 95%   BMI 27.96 kg/m     Wt Readings from Last 3 Encounters:  02/04/22 181 lb 3.2 oz (82.2 kg)  04/13/21 178 lb 3.2 oz (80.8 kg)  12/03/20 169 lb (76.7 kg)     GEN: Overweight. No acute distress HEENT: Normal NECK: No JVD. LYMPHATICS: No  lymphadenopathy CARDIAC: No murmur. RRR no gallop, or edema. VASCULAR:  Normal Pulses. No bruits. RESPIRATORY:  Clear to auscultation without rales, wheezing or rhonchi  ABDOMEN: Soft, non-tender, non-distended, No pulsatile mass, MUSCULOSKELETAL: No deformity  SKIN: Warm and dry NEUROLOGIC:  Alert and oriented x 3 PSYCHIATRIC:  Normal affect   ASSESSMENT:    1. Hx of mitral valve repair   2. Atherosclerosis of native coronary artery of native  heart without angina pectoris   3. Mixed hyperlipidemia   4. Essential hypertension, benign    PLAN:    In order of problems listed above:  No auscultatory evidence of mitral regurgitation.  Last imaging was done 4 years ago.  She did have an echocardiogram next year, at the time that he establishes with Dr. Marlou Porch. Continue aggressive risk factor modification.  He had moderate LAD stenosis that cath prior to mitral valve repair.  He has never had angina. Continue aggressive lipid-lowering Continue aggressive blood pressure control  Overall education and awareness concerning primary/secondary risk prevention was discussed in detail: LDL less than 70, hemoglobin A1c less than 7, blood pressure target less than 130/80 mmHg, >150 minutes of moderate aerobic activity per week, avoidance of smoking, weight control (via diet and exercise), and continued surveillance/management of/for obstructive sleep apnea.  Recommended Dr. Candee Furbish with follow-up in 6 to 9 months.  Medication Adjustments/Labs and Tests Ordered: Current medicines are reviewed at length with the patient today.  Concerns regarding medicines are outlined above.  No orders of the defined types were placed in this encounter.  No orders of the defined types were placed in this encounter.   There are no Patient Instructions on file for this visit.   Signed, Sinclair Grooms, MD  02/04/2022 9:21 AM    [place]

## 2022-02-04 ENCOUNTER — Encounter: Payer: Self-pay | Admitting: Interventional Cardiology

## 2022-02-04 ENCOUNTER — Ambulatory Visit: Payer: Medicare HMO | Attending: Interventional Cardiology | Admitting: Interventional Cardiology

## 2022-02-04 VITALS — BP 132/88 | HR 64 | Ht 67.5 in | Wt 181.2 lb

## 2022-02-04 DIAGNOSIS — E782 Mixed hyperlipidemia: Secondary | ICD-10-CM | POA: Diagnosis not present

## 2022-02-04 DIAGNOSIS — I10 Essential (primary) hypertension: Secondary | ICD-10-CM | POA: Diagnosis not present

## 2022-02-04 DIAGNOSIS — Z9889 Other specified postprocedural states: Secondary | ICD-10-CM | POA: Diagnosis not present

## 2022-02-04 DIAGNOSIS — I2510 Atherosclerotic heart disease of native coronary artery without angina pectoris: Secondary | ICD-10-CM

## 2022-02-04 MED ORDER — ATORVASTATIN CALCIUM 40 MG PO TABS: 40.0000 mg | ORAL_TABLET | Freq: Every day | ORAL | 3 refills | Status: DC

## 2022-02-04 NOTE — Patient Instructions (Signed)
Medication Instructions:  Your physician recommends that you continue on your current medications as directed. Please refer to the Current Medication list given to you today.  *If you need a refill on your cardiac medications before your next appointment, please call your pharmacy*  Follow-Up: At [facility], you and your health needs are our priority.  As part of our continuing mission to provide you with exceptional heart care, we have created designated Provider Care Teams.  These Care Teams include your primary Cardiologist (physician) and Advanced Practice Providers (APPs -  Physician Assistants and Nurse Practitioners) who all work together to provide you with the care you need, when you need it.  Your next appointment:   9-12 month(s)  The format for your next appointment:   In Person  Provider:   Candee Furbish, MD  Important Information About Sugar

## 2022-05-09 DIAGNOSIS — Z8249 Family history of ischemic heart disease and other diseases of the circulatory system: Secondary | ICD-10-CM | POA: Diagnosis not present

## 2022-05-09 DIAGNOSIS — E785 Hyperlipidemia, unspecified: Secondary | ICD-10-CM | POA: Diagnosis not present

## 2022-05-09 DIAGNOSIS — R69 Illness, unspecified: Secondary | ICD-10-CM | POA: Diagnosis not present

## 2022-05-09 DIAGNOSIS — N529 Male erectile dysfunction, unspecified: Secondary | ICD-10-CM | POA: Diagnosis not present

## 2022-05-09 DIAGNOSIS — I10 Essential (primary) hypertension: Secondary | ICD-10-CM | POA: Diagnosis not present

## 2022-07-01 DIAGNOSIS — R109 Unspecified abdominal pain: Secondary | ICD-10-CM | POA: Diagnosis not present

## 2022-10-13 DIAGNOSIS — Z1211 Encounter for screening for malignant neoplasm of colon: Secondary | ICD-10-CM | POA: Diagnosis not present

## 2022-10-13 DIAGNOSIS — E7800 Pure hypercholesterolemia, unspecified: Secondary | ICD-10-CM | POA: Diagnosis not present

## 2022-10-13 DIAGNOSIS — Z0000 Encounter for general adult medical examination without abnormal findings: Secondary | ICD-10-CM | POA: Diagnosis not present

## 2022-10-13 DIAGNOSIS — N529 Male erectile dysfunction, unspecified: Secondary | ICD-10-CM | POA: Diagnosis not present

## 2022-10-13 DIAGNOSIS — I2510 Atherosclerotic heart disease of native coronary artery without angina pectoris: Secondary | ICD-10-CM | POA: Diagnosis not present

## 2022-10-13 DIAGNOSIS — Z79899 Other long term (current) drug therapy: Secondary | ICD-10-CM | POA: Diagnosis not present

## 2022-10-13 DIAGNOSIS — F418 Other specified anxiety disorders: Secondary | ICD-10-CM | POA: Diagnosis not present

## 2022-10-13 DIAGNOSIS — M545 Low back pain, unspecified: Secondary | ICD-10-CM | POA: Diagnosis not present

## 2022-10-13 DIAGNOSIS — Z1331 Encounter for screening for depression: Secondary | ICD-10-CM | POA: Diagnosis not present

## 2022-10-13 DIAGNOSIS — H6123 Impacted cerumen, bilateral: Secondary | ICD-10-CM | POA: Diagnosis not present

## 2022-10-13 DIAGNOSIS — Z125 Encounter for screening for malignant neoplasm of prostate: Secondary | ICD-10-CM | POA: Diagnosis not present

## 2022-10-13 DIAGNOSIS — Z23 Encounter for immunization: Secondary | ICD-10-CM | POA: Diagnosis not present

## 2022-10-13 DIAGNOSIS — F322 Major depressive disorder, single episode, severe without psychotic features: Secondary | ICD-10-CM | POA: Diagnosis not present

## 2022-10-13 DIAGNOSIS — R7301 Impaired fasting glucose: Secondary | ICD-10-CM | POA: Diagnosis not present

## 2022-10-13 LAB — COMPREHENSIVE METABOLIC PANEL: EGFR: 74

## 2022-10-28 DIAGNOSIS — H6123 Impacted cerumen, bilateral: Secondary | ICD-10-CM | POA: Diagnosis not present

## 2022-12-07 ENCOUNTER — Ambulatory Visit: Payer: Medicare HMO | Attending: Cardiology | Admitting: Cardiology

## 2022-12-07 ENCOUNTER — Encounter: Payer: Self-pay | Admitting: Cardiology

## 2022-12-07 VITALS — BP 124/88 | HR 88 | Ht 67.5 in | Wt 176.0 lb

## 2022-12-07 DIAGNOSIS — E782 Mixed hyperlipidemia: Secondary | ICD-10-CM | POA: Diagnosis not present

## 2022-12-07 DIAGNOSIS — I2510 Atherosclerotic heart disease of native coronary artery without angina pectoris: Secondary | ICD-10-CM | POA: Diagnosis not present

## 2022-12-07 DIAGNOSIS — Z9889 Other specified postprocedural states: Secondary | ICD-10-CM

## 2022-12-07 NOTE — Patient Instructions (Signed)

## 2022-12-07 NOTE — Progress Notes (Signed)
Cardiology Office Note:  .   Date:  12/07/2022  ID:  Jerry Tanner, DOB Oct 15, 1950, MRN [id number] PCP: Emilio Aspen, MD  [facility] Providers Cardiologist:  Donato Schultz, MD     History of Present Illness: .   Jerry Tanner is a 72 y.o. male Discussed the use of AI scribe   History of Present Illness   The patient is a 72 year old male with a history of mitral valve repair in 2011, hyperlipidemia, and hypertension. He has been adhering to his prescribed medications, including aspirin 81mg  and atorvastatin 40mg . He reports no current symptoms and maintains an active lifestyle, including regular exercise at a local gym. He has a history of moderate left anterior descending artery disease, with less than or equal to 60% blockage noted on a heart catheterization in 2011. A myocardial perfusion study in 2022 showed normal blood flow through the heart muscle.  The patient has a family history of heart disease, with both parents having undergone heart surgeries. He has been experiencing difficulty sleeping for about a year and has been taking melatonin to manage this. He reports no adverse effects from the melatonin. 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  The patient has a tendency to consume too many sweets but otherwise reports a good diet. He has never smoked and rarely drinks alcohol. He has been considering joining a more supervised exercise program at a local wellness center. He has been managing his health well and plans to continue his current regimen of medications and lifestyle modifications.          ROS: No CP no SOB  Studies Reviewed: Marland Kitchen   EKG Interpretation Date/Time:  Tuesday December 07 2022 10:31:15 EDT Ventricular Rate:  88 PR Interval:  214 QRS Duration:  86 QT Interval:  356 QTC Calculation: 430 R Axis:   -58  Text Interpretation: Sinus rhythm with 1st degree A-V block Left  anterior fascicular block Cannot rule out Inferior infarct (cited on or before 07-Dec-2022) When compared with ECG of 29-Jan-2013 14:51, No significant change was found Confirmed by Donato Schultz ([id number]) on 12/07/2022 10:40:25 AM    Results LABS LDL: 59  DIAGNOSTIC Nuclear stress test: Low risk (2022) Cardiac catheterization: Moderate LAD disease with less than or equal to 60% stenosis (02/27/2009) Myocardial perfusion study: Normal (2022)  Risk Assessment/Calculations:            Physical Exam:   VS:  BP 124/88   Pulse 88   Ht 5' 7.5" (1.715 m)   Wt 176 lb (79.8 kg)   SpO2 94%   BMI 27.16 kg/m    Wt Readings from Last 3 Encounters:  12/07/22 176 lb (79.8 kg)  02/04/22 181 lb 3.2 oz (82.2 kg)  04/13/21 178 lb 3.2 oz (80.8 kg)    GEN: Well nourished, well developed in no acute distress NECK: No JVD; No carotid bruits CARDIAC: RRR, no murmurs, no rubs, no gallops RESPIRATORY:  Clear to auscultation without rales, wheezing or rhonchi  ABDOMEN: Soft, non-tender, non-distended EXTREMITIES:  No edema; No deformity   ASSESSMENT AND PLAN: .    Assessment and Plan    Mitral Valve Repair (2011) No current symptoms or complications reported. -Continue current management and annual follow-ups.  Hyperlipidemia Well-managed with Atorvastatin 40mg  daily. LDL is 59. -Continue Atorvastatin 40mg  daily.  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 Last myocardial perfusion study in 2022 was normal. -Continue current management and monitor for any new symptoms.  Insomnia Patient reports difficulty sleeping and has been taking Melatonin. -Continue Melatonin as needed, not exceeding 5mg  per dose.  General Health Maintenance -Continue daily Aspirin 81mg . -Continue regular exercise, consider joining [employer] for supervised workouts. -Annual follow-up or sooner if any changes occur.               Signed, Donato Schultz, MD

## 2022-12-21 ENCOUNTER — Other Ambulatory Visit (HOSPITAL_BASED_OUTPATIENT_CLINIC_OR_DEPARTMENT_OTHER): Payer: Self-pay

## 2022-12-21 MED ORDER — FLUAD 0.5 ML IM SUSY
PREFILLED_SYRINGE | INTRAMUSCULAR | 0 refills | Status: AC
  Filled 2022-12-21: qty 0.5, 1d supply, fill #0

## 2023-01-06 ENCOUNTER — Encounter: Payer: Self-pay | Admitting: Dermatology

## 2023-01-06 ENCOUNTER — Ambulatory Visit: Payer: Medicare HMO | Admitting: Dermatology

## 2023-01-06 VITALS — BP 121/77 | HR 78

## 2023-01-06 DIAGNOSIS — D1801 Hemangioma of skin and subcutaneous tissue: Secondary | ICD-10-CM

## 2023-01-06 DIAGNOSIS — W908XXA Exposure to other nonionizing radiation, initial encounter: Secondary | ICD-10-CM

## 2023-01-06 DIAGNOSIS — L578 Other skin changes due to chronic exposure to nonionizing radiation: Secondary | ICD-10-CM

## 2023-01-06 DIAGNOSIS — D229 Melanocytic nevi, unspecified: Secondary | ICD-10-CM

## 2023-01-06 DIAGNOSIS — L570 Actinic keratosis: Secondary | ICD-10-CM | POA: Diagnosis not present

## 2023-01-06 DIAGNOSIS — L814 Other melanin hyperpigmentation: Secondary | ICD-10-CM

## 2023-01-06 DIAGNOSIS — Z1283 Encounter for screening for malignant neoplasm of skin: Secondary | ICD-10-CM | POA: Diagnosis not present

## 2023-01-06 DIAGNOSIS — L821 Other seborrheic keratosis: Secondary | ICD-10-CM | POA: Diagnosis not present

## 2023-01-06 NOTE — Progress Notes (Signed)
   New Patient Visit   Subjective  Jerry Tanner is a 72 y.o. male who presents for the following:  Total Body Skin Exam (TBSE)  Patient present today for new patient visit for TBSE.The patient denies he  has spots, moles and lesions to be evaluated, some may be new or changing and the patient may have concern these could be cancer. Patient has previously been treated by a dermatologist. Patient reports he  does not have hx of bx. Patient reports family history of skin cancers (Dad). Patient reports throughout his lifetime has had moderate sun exposure. Currently, patient reports if he  has excessive sun exposure, he  does apply sunscreen and/or wears protective coverings.  The following portions of the chart were reviewed this encounter and updated as appropriate: medications, allergies, medical history  Review of Systems:  No other skin or systemic complaints except as noted in HPI or Assessment and Plan.  Objective  Well appearing patient in no apparent distress; mood and affect are within normal limits.  A full examination was performed including scalp, head, eyes, ears, nose, lips, neck, chest, axillae, abdomen, back, buttocks, bilateral upper extremities, bilateral lower extremities, hands, feet, fingers, toes, fingernails, and toenails. All findings within normal limits unless otherwise noted below.   Relevant exam findings are noted in the Assessment and Plan.    Assessment & Plan   LENTIGINES, SEBORRHEIC KERATOSES, HEMANGIOMAS - Benign normal skin lesions - Benign-appearing - Call for any changes  MELANOCYTIC NEVI - Tan-brown and/or pink-flesh-colored symmetric macules and papules - Benign appearing on exam today - Observation - Call clinic for new or changing moles - Recommend daily use of broad spectrum spf 30+ sunscreen to sun-exposed areas.   ACTINIC DAMAGE - Chronic condition, secondary to cumulative UV/sun exposure - diffuse scaly erythematous macules with underlying  dyspigmentation - Recommend daily broad spectrum sunscreen SPF 30+ to sun-exposed areas, reapply every 2 hours as needed.  - Staying in the shade or wearing long sleeves, sun glasses (UVA+UVB protection) and wide brim hats (4-inch brim around the entire circumference of the hat) are also recommended for sun protection.  - Call for new or changing lesions.  SKIN CANCER SCREENING PERFORMED TODAY   AK (actinic keratosis) (2) Dorsum of Nose  Destruction of lesion - Dorsum of Nose (2) Complexity: simple   Destruction method: cryotherapy   Informed consent: discussed and consent obtained   Timeout:  patient name, date of birth, surgical site, and procedure verified Lesion destroyed using liquid nitrogen: Yes   Region frozen until ice ball extended beyond lesion: Yes   Outcome: patient tolerated procedure well with no complications   Post-procedure details: wound care instructions given      No follow-ups on file.   Documentation: I have reviewed the above documentation for accuracy and completeness, and I agree with the above.   I, Shirron Marcha Solders, CMA, am acting as scribe for [employer], DO.   Langston Reusing, DO

## 2023-01-06 NOTE — Patient Instructions (Addendum)

## 2023-01-11 ENCOUNTER — Other Ambulatory Visit (HOSPITAL_BASED_OUTPATIENT_CLINIC_OR_DEPARTMENT_OTHER): Payer: Self-pay

## 2023-01-22 ENCOUNTER — Encounter: Payer: Self-pay | Admitting: Dermatology

## 2023-01-24 ENCOUNTER — Other Ambulatory Visit (HOSPITAL_BASED_OUTPATIENT_CLINIC_OR_DEPARTMENT_OTHER): Payer: Self-pay

## 2023-01-24 MED ORDER — COMIRNATY 30 MCG/0.3ML IM SUSY
0.3000 mL | PREFILLED_SYRINGE | Freq: Once | INTRAMUSCULAR | 0 refills | Status: AC
  Filled 2023-01-24: qty 0.3, 1d supply, fill #0

## 2023-01-31 ENCOUNTER — Other Ambulatory Visit: Payer: Self-pay | Admitting: *Deleted

## 2023-01-31 MED ORDER — ATORVASTATIN CALCIUM 40 MG PO TABS
40.0000 mg | ORAL_TABLET | Freq: Every day | ORAL | 3 refills | Status: AC
Start: 2023-01-31 — End: ?

## 2023-05-06 DIAGNOSIS — H43813 Vitreous degeneration, bilateral: Secondary | ICD-10-CM | POA: Diagnosis not present

## 2023-05-06 DIAGNOSIS — H5213 Myopia, bilateral: Secondary | ICD-10-CM | POA: Diagnosis not present

## 2023-05-06 DIAGNOSIS — Z961 Presence of intraocular lens: Secondary | ICD-10-CM | POA: Diagnosis not present

## 2023-05-24 DIAGNOSIS — R030 Elevated blood-pressure reading, without diagnosis of hypertension: Secondary | ICD-10-CM | POA: Diagnosis not present

## 2023-07-11 DIAGNOSIS — Z1211 Encounter for screening for malignant neoplasm of colon: Secondary | ICD-10-CM | POA: Diagnosis not present

## 2023-07-11 DIAGNOSIS — Z1212 Encounter for screening for malignant neoplasm of rectum: Secondary | ICD-10-CM | POA: Diagnosis not present

## 2023-07-17 LAB — COLOGUARD: COLOGUARD: NEGATIVE

## 2023-08-03 ENCOUNTER — Emergency Department (HOSPITAL_BASED_OUTPATIENT_CLINIC_OR_DEPARTMENT_OTHER): Admitting: Radiology

## 2023-08-03 ENCOUNTER — Emergency Department (HOSPITAL_BASED_OUTPATIENT_CLINIC_OR_DEPARTMENT_OTHER)
Admission: EM | Admit: 2023-08-03 | Discharge: 2023-08-03 | Disposition: A | Discharge status: Home / Self Care | Attending: Emergency Medicine | Admitting: Emergency Medicine

## 2023-08-03 ENCOUNTER — Encounter (HOSPITAL_BASED_OUTPATIENT_CLINIC_OR_DEPARTMENT_OTHER): Payer: Self-pay | Admitting: Emergency Medicine

## 2023-08-03 ENCOUNTER — Other Ambulatory Visit: Payer: Self-pay

## 2023-08-03 DIAGNOSIS — R531 Weakness: Secondary | ICD-10-CM | POA: Diagnosis not present

## 2023-08-03 DIAGNOSIS — Z7982 Long term (current) use of aspirin: Secondary | ICD-10-CM | POA: Diagnosis not present

## 2023-08-03 DIAGNOSIS — I10 Essential (primary) hypertension: Secondary | ICD-10-CM | POA: Insufficient documentation

## 2023-08-03 DIAGNOSIS — F419 Anxiety disorder, unspecified: Secondary | ICD-10-CM | POA: Diagnosis not present

## 2023-08-03 DIAGNOSIS — Z79899 Other long term (current) drug therapy: Secondary | ICD-10-CM | POA: Diagnosis not present

## 2023-08-03 DIAGNOSIS — E871 Hypo-osmolality and hyponatremia: Secondary | ICD-10-CM | POA: Diagnosis not present

## 2023-08-03 DIAGNOSIS — R5383 Other fatigue: Secondary | ICD-10-CM | POA: Diagnosis not present

## 2023-08-03 HISTORY — DX: Anxiety disorder, unspecified: F41.9

## 2023-08-03 LAB — TROPONIN T, HIGH SENSITIVITY: Troponin T High Sensitivity: <15 ng/L (ref <19)

## 2023-08-03 LAB — CBC
HCT: 39.3 % (ref 39.0–52.0)
Hemoglobin: 13.6 g/dL (ref 13.0–17.0)
MCH: 35.3 pg — ABNORMAL HIGH (ref 26.0–34.0)
MCHC: 34.6 g/dL (ref 30.0–36.0)
MCV: 102.1 fL — ABNORMAL HIGH (ref 80.0–100.0)
Platelets: 246 10*3/uL (ref 150–400)
RBC: 3.85 MIL/uL — ABNORMAL LOW (ref 4.22–5.81)
RDW: 11.4 % — ABNORMAL LOW (ref 11.5–15.5)
WBC: 6.9 10*3/uL (ref 4.0–10.5)
nRBC: 0 % (ref 0.0–0.2)

## 2023-08-03 LAB — BASIC METABOLIC PANEL WITH GFR
Anion gap: 14 (ref 5–15)
BUN: 16 mg/dL (ref 8–23)
CO2: 24 mmol/L (ref 22–32)
Calcium: 9.9 mg/dL (ref 8.9–10.3)
Chloride: 91 mmol/L — ABNORMAL LOW (ref 98–111)
Creatinine, Ser: 1.12 mg/dL (ref 0.61–1.24)
GFR, Estimated: >60 mL/min (ref >60)
Glucose, Bld: 108 mg/dL — ABNORMAL HIGH (ref 70–99)
Potassium: 4.5 mmol/L (ref 3.5–5.1)
Sodium: 129 mmol/L — ABNORMAL LOW (ref 135–145)

## 2023-08-03 LAB — TSH: TSH: 0.655 u[IU]/mL (ref 0.350–4.500)

## 2023-08-03 LAB — T4, FREE: Free T4: 1.16 ng/dL — ABNORMAL HIGH (ref 0.61–1.12)

## 2023-08-03 MED ORDER — BUPROPION HCL ER (SR) 100 MG PO TB12: 100.0000 mg | ORAL_TABLET | Freq: Every day | ORAL | 0 refills | Status: DC

## 2023-08-03 MED ORDER — LORAZEPAM 2 MG/ML IJ SOLN
1.0000 mg | INTRAMUSCULAR | Status: AC
  Administered 2023-08-03: 1 mg via INTRAVENOUS
  Filled 2023-08-03: qty 1

## 2023-08-03 NOTE — ED Provider Notes (Signed)
 [place] EMERGENCY DEPARTMENT AT [facility] Provider Note   CSN: [id number] Arrival date & time: 08/03/23  1633     History  Chief Complaint  Patient presents with   Anxiety    Jerry Tanner is a 73 y.o. male.  73 year old male with a history of mitral valve, hypertension, hyperlipidemia, and anxiety who presents to the emergency department with anxiety and generalized weakness.  Patient reports that recently the past week he has felt more anxious than usual.  Also has had some generalized fatigue.  Says that since the passing of his wife he is depressed.  Has been taking Xanax at home which has been improving his symptoms with today around 3 PM and his fatigue and anxiety worsened.  Denies, HI, AVH.  No significant use.  Minimal alcohol intake.  No palpitations, chest pain, shortness of breath, nausea, or hematochezia.       Home Medications Prior to Admission medications   Medication Sig Start Date End Date Taking? Authorizing Provider  buPROPion ER (WELLBUTRIN SR) 100 MG 12 hr tablet Take 1 tablet (100 mg total) by mouth daily for 7 days. 08/03/23 08/10/23 Yes Ninetta Basket, MD  acetaminophen (TYLENOL) 500 MG tablet Take 2 tablets by mouth as needed.    [provider]  ALPRAZolam (XANAX) 0.5 MG tablet Take 1 tablet by mouth as needed. 01/01/21   [provider]  amoxicillin (AMOXIL) 500 MG capsule Take 4 capsules by mouth as needed. Prior to dental procedures 12/28/16   [provider]  Ascorbic Acid (VITAMIN C) 1000 MG tablet Take 1 tablet by mouth daily.    [provider]  aspirin EC 81 MG tablet Take 81 mg by mouth daily.    [provider]  atorvastatin  (LIPITOR) 40 MG tablet Take 1 tablet (40 mg total) by mouth daily. 01/31/23   Hugh Madura, MD  cholecalciferol (VITAMIN D3) 25 MCG (1000 UNIT) tablet Take 1 tablet by mouth daily.    [provider]  COVID-19 mRNA vaccine [phone] (COMIRNATY ) SUSP injection  Inject into the muscle. 12/04/21     doxycycline (ADOXA) 100 MG tablet Take 100 mg by mouth 2 (two) times daily. 03/20/21   [provider]  influenza vaccine adjuvanted (FLUAD) 0.5 ML injection Inject into the muscle. 12/21/21   Liane Redman, MD  influenza vaccine adjuvanted (FLUAD) 0.5 ML injection Inject into the muscle. 12/21/22   Liane Redman, MD  LORazepam (ATIVAN) 0.5 MG tablet Take 0.5 mg by mouth every 8 (eight) hours as needed for anxiety.    [provider]  Meclizine HCl 25 MG CHEW Chew 1 tablet by mouth as needed. 12/11/19   [provider]  metoprolol  succinate (TOPROL -XL) 25 MG 24 hr tablet Take 25 mg by mouth daily.    [provider]  RSV vaccine recomb adjuvanted (AREXVY ) 120 MCG/0.5ML injection Inject into the muscle. 12/29/21     sertraline (ZOLOFT) 50 MG tablet Take 50 mg by mouth daily. Patient not taking: Reported on 01/06/2023 03/20/19   [provider]  sildenafil (REVATIO) 20 MG tablet Take 3 tablets by mouth as needed. 05/13/20   [provider]      Allergies    Patient has no known allergies.    Review of Systems   Review of Systems  Physical Exam Updated Vital Signs BP 129/80   Pulse 78   Temp 98.4 F (36.9 C)   Resp 18   Ht 5' 7 (1.702 m)  Wt 74.8 kg   SpO2 98%   BMI 25.84 kg/m  Physical Exam Vitals and nursing note reviewed.  Constitutional:      General: He is not in acute distress.    Appearance: He is well-developed.  HENT:     Head: Normocephalic and atraumatic.     Right Ear: External ear normal.     Left Ear: External ear normal.     Nose: Nose normal.   Eyes:     Extraocular Movements: Extraocular movements intact.     Conjunctiva/sclera: Conjunctivae normal.     Pupils: Pupils are equal, round, and reactive to light.    Cardiovascular:     Rate and Rhythm: Normal rate and regular rhythm.     Heart sounds: Normal heart sounds.  Pulmonary:     Effort: Pulmonary effort is  normal. No respiratory distress.     Breath sounds: Normal breath sounds.   Musculoskeletal:     Cervical back: Normal range of motion and neck supple.     Right lower leg: No edema.     Left lower leg: No edema.   Skin:    General: Skin is warm and dry.   Neurological:     Mental Status: He is alert. Mental status is at baseline.   Psychiatric:        Mood and Affect: Mood normal.        Behavior: Behavior normal.     ED Results / Procedures / Treatments   Labs (all labs ordered are listed, but only abnormal results are displayed) Labs Reviewed  BASIC METABOLIC PANEL WITH GFR - Abnormal; Notable for the following components:      Result Value   Sodium 129 (*)    Chloride 91 (*)    Glucose, Bld 108 (*)    All other components within normal limits  CBC - Abnormal; Notable for the following components:   RBC 3.85 (*)    MCV 102.1 (*)    MCH 35.3 (*)    RDW 11.4 (*)    All other components within normal limits  T4, FREE - Abnormal; Notable for the following components:   Free T4 1.16 (*)    All other components within normal limits  TSH  TROPONIN T, HIGH SENSITIVITY    EKG EKG Interpretation Date/Time:  Wednesday August 03 2023 16:56:12 EDT Ventricular Rate:  81 PR Interval:  157 QRS Duration:  97 QT Interval:  363 QTC Calculation: 422 R Axis:   -58  Text Interpretation: Sinus or ectopic atrial rhythm Left anterior fascicular block Abnormal R-wave progression, early transition Nonspecific ST abnormality Confirmed by Shyrl Doyne [id number]) on 08/03/2023 5:00:36 PM  Radiology DG Chest 2 View Result Date: 08/03/2023 CLINICAL DATA:  Fatigue, weakness. EXAM: CHEST - 2 VIEW COMPARISON:  Jun 22, 2012. FINDINGS: The heart size and mediastinal contours are within normal limits. Both lungs are clear. The visualized skeletal structures are unremarkable. IMPRESSION: No active cardiopulmonary disease. Electronically Signed   By: Rosalene Colon M.D.   On: 08/03/2023 17:25     Procedures Procedures    Medications Ordered in ED Medications  LORazepam (ATIVAN) injection 1 mg (1 mg Intravenous Given 08/03/23 1705)    ED Course/ Medical Decision Making/ A&P                                 Medical Decision Making Amount and/or Complexity of Data Reviewed Labs:  ordered. Radiology: ordered.  Risk Prescription drug management.   73 year old male with a history of mitral valve, hypertension, hyperlipidemia, and anxiety who presents to the emergency department with anxiety and generalized weakness.  Initial Ddx:  Anxiety, hyperthyroidism, MI, atrial fibrillation/paroxysmal arrhythmia, electrolyte abnormality  MDM/Course:  Patient presents emergency department with anxiety.  Does have a history of this and is taking his Xanax but reports its gotten worse.  He is concerned about possible atrial fibrillation but is not had any palpitations or any other symptoms that would suggest this.  With his age and risk factors did obtain an EKG and troponins which do not reflect acute MI.  Had TSH that was WNL.  Free T4 was pending at the time of discharge but was found to be only marginally elevated at 1.16.  Feel that thyroid  pathology is unlikely the cause of his symptoms.  Was also noted to have a sodium of 129 and looks like his baseline is normal.  Is on several antidepressants that could cause this.  Not having severe symptoms that would warrant admission.  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  Concerned that bupropion may be related so we will slowly taper this and have him take his Xanax for anxiety for the time being and have him follow-up with his primary doctor regarding this since he may need to be started on additional medication and have his sodium redrawn.  This patient presents to the ED for concern of complaints listed in HPI, this involves an extensive number of treatment options, and is a complaint that carries  with it a high risk of complications and morbidity. Disposition including potential need for admission considered.   Dispo: DC Home. Return precautions discussed including, but not limited to, those listed in the AVS. Allowed pt time to ask questions which were answered fully prior to dc.  Records reviewed Outpatient Clinic Notes The following labs were independently interpreted: Chemistry and show hyponatremia I independently reviewed the following imaging with scope of interpretation limited to determining acute life threatening conditions related to emergency care: Chest x-ray and agree with the radiologist interpretation with the following exceptions: none I personally reviewed and interpreted cardiac monitoring: normal sinus rhythm  I personally reviewed and interpreted the pt's EKG: see above for interpretation  I have reviewed the patients home medications and made adjustments as needed Social Determinants of health:  Geriatric  Portions of this note were generated with Scientist, clinical (histocompatibility and immunogenetics). Dictation errors may occur despite best attempts at proofreading.     Final Clinical Impression(s) / ED Diagnoses Final diagnoses:  Anxiety  Hyponatremia    Rx / DC Orders ED Discharge Orders          Ordered    buPROPion ER (WELLBUTRIN SR) 100 MG 12 hr tablet  Daily        08/03/23 1814              Ninetta Basket, MD 08/04/23 1545

## 2023-08-03 NOTE — Discharge Instructions (Addendum)
 You were seen for your anxiety in the emergency department.   At home, please restrict your water intake to 2 liters per day.  Please take the reduced dose of the bupropion for a week then stop it after that since it can cause low sodium.   Check your MyChart online for the results of any tests that had not resulted by the time you left the emergency department.   Follow-up with your primary doctor in 2-3 days regarding your visit.  Talk to them about your low sodium.   Return immediately to the emergency department if you experience any of the following: tremors, seizures, or any other concerning symptoms.    Thank you for visiting our Emergency Department. It was a pleasure taking care of you today.

## 2023-08-03 NOTE — ED Triage Notes (Signed)
 Pt caox4, ambulatory c/o increased anxiety and feeling fatigued over the past week. Denies CP and SOB.

## 2023-08-04 DIAGNOSIS — E871 Hypo-osmolality and hyponatremia: Secondary | ICD-10-CM | POA: Diagnosis not present

## 2023-08-04 DIAGNOSIS — F411 Generalized anxiety disorder: Secondary | ICD-10-CM | POA: Diagnosis not present

## 2023-08-04 DIAGNOSIS — M545 Low back pain, unspecified: Secondary | ICD-10-CM | POA: Diagnosis not present

## 2023-08-19 DIAGNOSIS — E871 Hypo-osmolality and hyponatremia: Secondary | ICD-10-CM | POA: Diagnosis not present

## 2023-09-02 DIAGNOSIS — E871 Hypo-osmolality and hyponatremia: Secondary | ICD-10-CM | POA: Diagnosis not present

## 2023-09-07 DIAGNOSIS — F332 Major depressive disorder, recurrent severe without psychotic features: Secondary | ICD-10-CM | POA: Diagnosis not present

## 2023-09-28 DIAGNOSIS — F332 Major depressive disorder, recurrent severe without psychotic features: Secondary | ICD-10-CM | POA: Diagnosis not present

## 2023-10-14 DIAGNOSIS — E7800 Pure hypercholesterolemia, unspecified: Secondary | ICD-10-CM | POA: Diagnosis not present

## 2023-10-14 DIAGNOSIS — Z79899 Other long term (current) drug therapy: Secondary | ICD-10-CM | POA: Diagnosis not present

## 2023-10-14 DIAGNOSIS — Z125 Encounter for screening for malignant neoplasm of prostate: Secondary | ICD-10-CM | POA: Diagnosis not present

## 2023-10-26 ENCOUNTER — Ambulatory Visit (INDEPENDENT_AMBULATORY_CARE_PROVIDER_SITE_OTHER): Payer: Self-pay | Admitting: Psychology

## 2023-10-26 DIAGNOSIS — F411 Generalized anxiety disorder: Secondary | ICD-10-CM | POA: Diagnosis not present
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
## 2023-11-07 DIAGNOSIS — F332 Major depressive disorder, recurrent severe without psychotic features: Secondary | ICD-10-CM | POA: Diagnosis not present

## 2023-11-17 ENCOUNTER — Ambulatory Visit: Admitting: Psychology

## 2023-11-17 DIAGNOSIS — F411 Generalized anxiety disorder: Secondary | ICD-10-CM
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
## 2023-12-14 ENCOUNTER — Other Ambulatory Visit (HOSPITAL_BASED_OUTPATIENT_CLINIC_OR_DEPARTMENT_OTHER): Payer: Self-pay

## 2023-12-14 MED ORDER — FLUZONE HIGH-DOSE 0.5 ML IM SUSY
0.5000 mL | PREFILLED_SYRINGE | Freq: Once | INTRAMUSCULAR | 0 refills | Status: AC
  Filled 2023-12-14: qty 0.5, 1d supply, fill #0

## 2023-12-14 MED ORDER — COMIRNATY 30 MCG/0.3ML IM SUSY
0.3000 mL | PREFILLED_SYRINGE | Freq: Once | INTRAMUSCULAR | 0 refills | Status: AC
  Filled 2023-12-14: qty 0.3, 1d supply, fill #0

## 2023-12-19 ENCOUNTER — Ambulatory Visit: Attending: Cardiology | Admitting: Cardiology

## 2023-12-19 ENCOUNTER — Encounter: Payer: Self-pay | Admitting: Cardiology

## 2023-12-19 VITALS — BP 157/88 | HR 92 | Ht 67.0 in | Wt 152.0 lb

## 2023-12-19 DIAGNOSIS — E782 Mixed hyperlipidemia: Secondary | ICD-10-CM | POA: Diagnosis not present

## 2023-12-19 DIAGNOSIS — Z9889 Other specified postprocedural states: Secondary | ICD-10-CM | POA: Diagnosis not present

## 2023-12-19 DIAGNOSIS — I2510 Atherosclerotic heart disease of native coronary artery without angina pectoris: Secondary | ICD-10-CM

## 2023-12-19 DIAGNOSIS — I349 Nonrheumatic mitral valve disorder, unspecified: Secondary | ICD-10-CM

## 2023-12-19 DIAGNOSIS — I059 Rheumatic mitral valve disease, unspecified: Secondary | ICD-10-CM

## 2023-12-19 NOTE — Progress Notes (Signed)
 Cardiology Office Note:  .   Date:  12/19/2023  ID:  Jerry Tanner, DOB [birth year], MRN [id number] PCP: Jerry Dorn LABOR, Jerry Tanner  [facility] Providers Cardiologist:  Jerry Parchment, Jerry Tanner     History of Present Illness: .   Jerry Tanner is a 73 y.o. male Discussed the use of AI scribe software   History of Present Illness Jerry Tanner is a 73 year old male with mitral valve repair and coronary artery disease who presents with increased anxiety and depression.  Since April 2025, he has experienced increased anxiety and depression, describing it as the worst he has ever had. He feels very nervous and sweats when visiting the doctor's office. He has a longstanding history of anxiety and is currently taking sertraline 150 mg daily. He is also seeing a psychiatrist and counselor for additional support.  His physical activity has decreased, as he spends too much time in bed after breakfast, contributing to back pain. Despite this, he attempts to stay active by biking at the [facility], completing about an hour and a half of biking over the last three days. He used to enjoy walking but now prefers biking.  He monitors his blood pressure at home, which is generally in the 130s/78-79 mmHg range. However, he notes higher readings in the doctor's office, attributing this to anxiety.  He has a history of mitral valve repair in 2011 and moderate coronary artery disease diagnosed the same year, with stable moderate LAD disease at 60%. His last echocardiogram was in 2019. He recalls a family history of mitral valve surgery, as his mother underwent the same procedure six months before him.  He is currently on atorvastatin  40 mg for hyperlipidemia, with an LDL of 53 and triglycerides of 110 as of October 14, 2023. There is a discrepancy in his medication records, with simvastatin 40 mg listed on an outside record.  Socially, he is retired from print production planner and enjoys listening to [employer]. He  acknowledges watching too much TV, which he believes contributes to his anxiety.      Studies Reviewed: .        Results LABS LDL: 53 mg/dL (91/77/7974) Triglycerides: 110 mg/dL Risk Assessment/Calculations:           Physical Exam:   VS:  BP (!) 157/88   Pulse 92   Ht 5' 7 (1.702 m)   Wt 152 lb (68.9 kg)   SpO2 94%   BMI 23.81 kg/m    Wt Readings from Last 3 Encounters:  12/19/23 152 lb (68.9 kg)  08/03/23 165 lb (74.8 kg)  12/07/22 176 lb (79.8 kg)    GEN: Well nourished, well developed in no acute distress, anxious NECK: No JVD; No carotid bruits CARDIAC: RRR, no murmurs, no rubs, no gallops RESPIRATORY:  Clear to auscultation without rales, wheezing or rhonchi  ABDOMEN: Soft, non-tender, non-distended EXTREMITIES:  No edema; No deformity   ASSESSMENT AND PLAN: .    Assessment and Plan Assessment & Plan Moderate left anterior descending artery disease Moderate LAD disease with 60% stenosis. Remains active with no new symptoms such as chest pain. - Encourage continued physical activity, such as biking. - statin  Status post mitral valve repair (2011) Mitral valve repair in 2011. Last echocardiogram in 2019. No current valve issues, but reassessment is prudent given the time elapsed. - Order echocardiogram to assess mitral valve status.  Hypertension Blood pressure readings at home are generally in the 130s/70s. Experiences elevated readings  in the office, likely due to anxiety. On olmesartan 25 mg daily. - Continue olmesartan 25 mg daily. - Monitor blood pressure at home.  Hyperlipidemia On atorvastatin  40 mg daily. LDL 53 mg/dL and triglycerides 889 mg/dL, indicating good control. Atorvastatin  confirmed as current medication. - Continue atorvastatin  40 mg daily.  Depression and anxiety Increased depression and anxiety since April, with this episode being the worst. On sertraline 150 mg daily and seeing a psychiatrist and counselor. Experiences anxiety  in medical settings. - Continue sertraline 150 mg daily. - Continue seeing psychiatrist and counselor for management.         1 yr  Signed, Jerry Parchment, Jerry Tanner

## 2023-12-19 NOTE — Patient Instructions (Signed)
 Medication Instructions:  Your physician recommends that you continue on your current medications as directed. Please refer to the Current Medication list given to you today.  *If you need a refill on your cardiac medications before your next appointment, please call your pharmacy*  Lab Work: none If you have labs (blood work) drawn today and your tests are completely normal, you will receive your results only by: MyChart Message (if you have MyChart) OR A paper copy in the mail If you have any lab test that is abnormal or we need to change your treatment, we will call you to review the results.  Testing/Procedures: Your physician has requested that you have an echocardiogram. Echocardiography is a painless test that uses sound waves to create images of your heart. It provides your doctor with information about the size and shape of your heart and how well your heart's chambers and valves are working. This procedure takes approximately one hour. There are no restrictions for this procedure. Please do NOT wear cologne, perfume, aftershave, or lotions (deodorant is allowed). Please arrive 15 minutes prior to your appointment time.  Please note: We ask at that you not bring children with you during ultrasound (echo/ vascular) testing. Due to room size and safety concerns, children are not allowed in the ultrasound rooms during exams. Our front office staff cannot provide observation of children in our lobby area while testing is being conducted. An adult accompanying a patient to their appointment will only be allowed in the ultrasound room at the discretion of the ultrasound technician under special circumstances. We apologize for any inconvenience.   Follow-Up: At [facility], you and your health needs are our priority.  As part of our continuing mission to provide you with exceptional heart care, our providers are all part of one team.  This team includes your primary Cardiologist  (physician) and Advanced Practice Providers or APPs (Physician Assistants and Nurse Practitioners) who all work together to provide you with the care you need, when you need it.  Your next appointment:   12 month(s)  Provider:   Oneil Parchment, MD    We recommend signing up for the patient portal called MyChart.  Sign up information is provided on this After Visit Summary.  MyChart is used to connect with patients for Virtual Visits (Telemedicine).  Patients are able to view lab/test results, encounter notes, upcoming appointments, etc.  Non-urgent messages can be sent to your provider as well.   To learn more about what you can do with MyChart, go to [email or web address].   Other Instructions

## 2023-12-22 ENCOUNTER — Ambulatory Visit: Admitting: Psychology

## 2023-12-22 DIAGNOSIS — F411 Generalized anxiety disorder: Secondary | ICD-10-CM
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
## 2023-12-27 DIAGNOSIS — F332 Major depressive disorder, recurrent severe without psychotic features: Secondary | ICD-10-CM | POA: Diagnosis not present

## 2024-01-05 ENCOUNTER — Ambulatory Visit: Payer: Medicare HMO | Admitting: Dermatology

## 2024-01-25 ENCOUNTER — Ambulatory Visit (HOSPITAL_COMMUNITY)
Admission: RE | Admit: 2024-01-25 | Discharge: 2024-01-25 | Disposition: A | Source: Ambulatory Visit | Attending: Cardiology | Admitting: Cardiology

## 2024-01-25 DIAGNOSIS — I349 Nonrheumatic mitral valve disorder, unspecified: Secondary | ICD-10-CM | POA: Diagnosis not present

## 2024-01-25 DIAGNOSIS — I059 Rheumatic mitral valve disease, unspecified: Secondary | ICD-10-CM | POA: Diagnosis not present

## 2024-01-25 DIAGNOSIS — Z9889 Other specified postprocedural states: Secondary | ICD-10-CM | POA: Diagnosis not present

## 2024-01-25 LAB — ECHOCARDIOGRAM COMPLETE
AR max vel: 2.38 cm2
AV Area VTI: 2.44 cm2
AV Area mean vel: 2.34 cm2
AV Mean grad: 2 mmHg
AV Peak grad: 2.8 mmHg
Ao pk vel: 0.84 m/s
Area-P 1/2: 1.79 cm2
MV M vel: 0.86 m/s
MV Peak grad: 3 mmHg
MV VTI: 0.89 cm2
S' Lateral: 3.04 cm

## 2024-01-26 ENCOUNTER — Ambulatory Visit: Admitting: Psychology

## 2024-01-26 DIAGNOSIS — F411 Generalized anxiety disorder: Secondary | ICD-10-CM | POA: Diagnosis not present
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
## 2024-01-29 ENCOUNTER — Ambulatory Visit: Payer: Self-pay | Admitting: Cardiology

## 2024-01-30 ENCOUNTER — Other Ambulatory Visit: Payer: Self-pay

## 2024-02-01 ENCOUNTER — Telehealth: Payer: Self-pay | Admitting: Cardiology

## 2024-02-01 NOTE — Telephone Encounter (Signed)
°*  STAT* If patient is at the pharmacy, call can be transferred to refill team.   1. Which medications need to be refilled? (please list name of each medication and dose if known)  atorvastatin  (LIPITOR) 40 MG tablet   2. Would you like to learn more about the convenience, safety, & potential cost savings by using the [facility]? No    3. Are you open to using the [facility] (Type [facility]. no  4. Which pharmacy/location (including street and city if local pharmacy) is medication to be sent to?   [facility] [id number] - [place], [place] - [address]    5. Do they need a 30 day or 90 day supply? 90 days

## 2024-02-02 MED ORDER — ATORVASTATIN CALCIUM 40 MG PO TABS: 40.0000 mg | ORAL_TABLET | Freq: Every day | ORAL | 3 refills | Status: AC

## 2024-02-02 NOTE — Telephone Encounter (Signed)
 Refill sent

## 2024-02-15 ENCOUNTER — Ambulatory Visit: Admitting: Psychology

## 2024-02-15 DIAGNOSIS — F411 Generalized anxiety disorder: Secondary | ICD-10-CM | POA: Diagnosis not present
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
## 2024-03-15 ENCOUNTER — Ambulatory Visit: Admitting: Psychology

## 2024-03-15 DIAGNOSIS — F411 Generalized anxiety disorder: Secondary | ICD-10-CM
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
## 2024-03-28 ENCOUNTER — Ambulatory Visit: Admitting: Psychology

## 2024-03-28 DIAGNOSIS — F411 Generalized anxiety disorder: Secondary | ICD-10-CM
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
## 2024-04-11 ENCOUNTER — Ambulatory Visit: Admitting: Psychology

## 2024-06-27 ENCOUNTER — Ambulatory Visit: Admitting: Dermatology
